# Patient Record
Sex: Female | Born: 1970 | Race: Black or African American | Hispanic: No | Marital: Married | Smoking: Never smoker
Health system: Southern US, Community
[De-identification: ages and names within clinical notes are randomized; demographics above are authoritative.]

## PROBLEM LIST (undated history)

## (undated) DIAGNOSIS — F32A Depression, unspecified: Secondary | ICD-10-CM

## (undated) DIAGNOSIS — I10 Essential (primary) hypertension: Secondary | ICD-10-CM

## (undated) DIAGNOSIS — F1010 Alcohol abuse, uncomplicated: Secondary | ICD-10-CM

## (undated) DIAGNOSIS — K219 Gastro-esophageal reflux disease without esophagitis: Secondary | ICD-10-CM

## (undated) DIAGNOSIS — F419 Anxiety disorder, unspecified: Secondary | ICD-10-CM

## (undated) DIAGNOSIS — D649 Anemia, unspecified: Secondary | ICD-10-CM

## (undated) HISTORY — DX: Depression, unspecified: F32.A

## (undated) HISTORY — DX: Anemia, unspecified: D64.9

## (undated) HISTORY — DX: Anxiety disorder, unspecified: F41.9

## (undated) HISTORY — DX: Alcohol abuse, uncomplicated: F10.10

---

## 1999-01-06 ENCOUNTER — Emergency Department (HOSPITAL_COMMUNITY): Admission: EM | Admit: 1999-01-06 | Discharge: 1999-01-06 | Payer: Self-pay | Admitting: Emergency Medicine

## 1999-03-11 ENCOUNTER — Inpatient Hospital Stay (HOSPITAL_COMMUNITY): Admission: AD | Admit: 1999-03-11 | Discharge: 1999-03-11 | Payer: Self-pay | Admitting: *Deleted

## 2001-01-05 ENCOUNTER — Emergency Department (HOSPITAL_COMMUNITY): Admission: EM | Admit: 2001-01-05 | Discharge: 2001-01-05 | Payer: Self-pay | Admitting: Emergency Medicine

## 2001-02-19 ENCOUNTER — Inpatient Hospital Stay (HOSPITAL_COMMUNITY): Admission: AD | Admit: 2001-02-19 | Discharge: 2001-02-19 | Payer: Self-pay | Admitting: *Deleted

## 2001-02-26 ENCOUNTER — Ambulatory Visit (HOSPITAL_COMMUNITY): Admission: RE | Admit: 2001-02-26 | Discharge: 2001-03-07 | Payer: Self-pay | Admitting: *Deleted

## 2001-03-07 ENCOUNTER — Encounter: Payer: Self-pay | Admitting: *Deleted

## 2001-07-04 ENCOUNTER — Inpatient Hospital Stay (HOSPITAL_COMMUNITY): Admission: AD | Admit: 2001-07-04 | Discharge: 2001-07-04 | Payer: Self-pay | Admitting: Obstetrics

## 2001-07-06 ENCOUNTER — Inpatient Hospital Stay (HOSPITAL_COMMUNITY): Admission: AD | Admit: 2001-07-06 | Discharge: 2001-07-08 | Payer: Self-pay | Admitting: *Deleted

## 2001-07-13 ENCOUNTER — Inpatient Hospital Stay (HOSPITAL_COMMUNITY): Admission: AD | Admit: 2001-07-13 | Discharge: 2001-07-13 | Payer: Self-pay | Admitting: *Deleted

## 2001-07-31 ENCOUNTER — Inpatient Hospital Stay (HOSPITAL_COMMUNITY): Admission: AD | Admit: 2001-07-31 | Discharge: 2001-07-31 | Payer: Self-pay | Admitting: *Deleted

## 2001-11-29 ENCOUNTER — Other Ambulatory Visit: Admission: RE | Admit: 2001-11-29 | Discharge: 2001-11-29 | Payer: Self-pay | Admitting: Obstetrics and Gynecology

## 2001-12-14 ENCOUNTER — Encounter: Admission: RE | Admit: 2001-12-14 | Discharge: 2001-12-14 | Payer: Self-pay | Admitting: Internal Medicine

## 2002-05-11 ENCOUNTER — Emergency Department (HOSPITAL_COMMUNITY): Admission: EM | Admit: 2002-05-11 | Discharge: 2002-05-11 | Payer: Self-pay | Admitting: Emergency Medicine

## 2002-09-13 ENCOUNTER — Emergency Department (HOSPITAL_COMMUNITY): Admission: EM | Admit: 2002-09-13 | Discharge: 2002-09-13 | Payer: Self-pay | Admitting: Emergency Medicine

## 2002-10-31 ENCOUNTER — Encounter: Payer: Self-pay | Admitting: Obstetrics & Gynecology

## 2002-10-31 ENCOUNTER — Ambulatory Visit (HOSPITAL_COMMUNITY): Admission: RE | Admit: 2002-10-31 | Discharge: 2002-10-31 | Payer: Self-pay | Admitting: Obstetrics & Gynecology

## 2002-12-30 ENCOUNTER — Encounter: Payer: Self-pay | Admitting: Obstetrics & Gynecology

## 2002-12-30 ENCOUNTER — Ambulatory Visit (HOSPITAL_COMMUNITY): Admission: RE | Admit: 2002-12-30 | Discharge: 2002-12-30 | Payer: Self-pay | Admitting: Obstetrics & Gynecology

## 2003-01-10 ENCOUNTER — Encounter: Admission: RE | Admit: 2003-01-10 | Discharge: 2003-01-10 | Payer: Self-pay | Admitting: Obstetrics

## 2003-02-13 ENCOUNTER — Encounter: Payer: Self-pay | Admitting: Obstetrics

## 2003-02-13 ENCOUNTER — Inpatient Hospital Stay (HOSPITAL_COMMUNITY): Admission: AD | Admit: 2003-02-13 | Discharge: 2003-02-14 | Payer: Self-pay | Admitting: Obstetrics

## 2003-02-28 ENCOUNTER — Ambulatory Visit (HOSPITAL_COMMUNITY): Admission: RE | Admit: 2003-02-28 | Discharge: 2003-02-28 | Payer: Self-pay | Admitting: Obstetrics

## 2003-02-28 ENCOUNTER — Encounter: Payer: Self-pay | Admitting: Obstetrics & Gynecology

## 2003-03-04 ENCOUNTER — Observation Stay (HOSPITAL_COMMUNITY): Admission: AD | Admit: 2003-03-04 | Discharge: 2003-03-05 | Payer: Self-pay | Admitting: Obstetrics

## 2003-03-09 ENCOUNTER — Encounter (INDEPENDENT_AMBULATORY_CARE_PROVIDER_SITE_OTHER): Payer: Self-pay | Admitting: Specialist

## 2003-03-09 ENCOUNTER — Inpatient Hospital Stay (HOSPITAL_COMMUNITY): Admission: AD | Admit: 2003-03-09 | Discharge: 2003-03-11 | Payer: Self-pay | Admitting: Obstetrics

## 2007-01-05 ENCOUNTER — Emergency Department (HOSPITAL_COMMUNITY): Admission: EM | Admit: 2007-01-05 | Discharge: 2007-01-05 | Payer: Self-pay | Admitting: Emergency Medicine

## 2007-01-17 ENCOUNTER — Ambulatory Visit (HOSPITAL_COMMUNITY): Admission: RE | Admit: 2007-01-17 | Discharge: 2007-01-17 | Payer: Self-pay | Admitting: Obstetrics

## 2007-10-22 ENCOUNTER — Emergency Department (HOSPITAL_COMMUNITY): Admission: EM | Admit: 2007-10-22 | Discharge: 2007-10-22 | Payer: Self-pay | Admitting: Emergency Medicine

## 2008-03-18 ENCOUNTER — Emergency Department (HOSPITAL_COMMUNITY): Admission: EM | Admit: 2008-03-18 | Discharge: 2008-03-18 | Payer: Self-pay | Admitting: Emergency Medicine

## 2008-11-28 ENCOUNTER — Emergency Department (HOSPITAL_COMMUNITY): Admission: EM | Admit: 2008-11-28 | Discharge: 2008-11-28 | Payer: Self-pay | Admitting: Emergency Medicine

## 2009-03-26 ENCOUNTER — Ambulatory Visit (HOSPITAL_COMMUNITY): Admission: RE | Admit: 2009-03-26 | Discharge: 2009-03-26 | Payer: Self-pay | Admitting: Obstetrics

## 2009-05-09 ENCOUNTER — Inpatient Hospital Stay (HOSPITAL_COMMUNITY): Admission: AD | Admit: 2009-05-09 | Discharge: 2009-05-24 | Payer: Self-pay | Admitting: Obstetrics

## 2009-05-11 ENCOUNTER — Encounter: Payer: Self-pay | Admitting: Obstetrics & Gynecology

## 2009-05-23 ENCOUNTER — Encounter: Payer: Self-pay | Admitting: Obstetrics

## 2009-09-15 ENCOUNTER — Emergency Department (HOSPITAL_COMMUNITY): Admission: EM | Admit: 2009-09-15 | Discharge: 2009-09-15 | Payer: Self-pay | Admitting: Emergency Medicine

## 2010-10-15 ENCOUNTER — Encounter: Admission: RE | Admit: 2010-10-15 | Discharge: 2010-10-15 | Payer: Self-pay | Admitting: Internal Medicine

## 2010-12-02 ENCOUNTER — Emergency Department (HOSPITAL_COMMUNITY): Admission: EM | Admit: 2010-12-02 | Discharge: 2010-03-12 | Payer: Self-pay | Admitting: Emergency Medicine

## 2011-01-16 ENCOUNTER — Encounter: Payer: Self-pay | Admitting: Internal Medicine

## 2011-01-17 ENCOUNTER — Encounter: Payer: Self-pay | Admitting: Obstetrics & Gynecology

## 2011-03-18 LAB — COMPREHENSIVE METABOLIC PANEL
AST: 26 U/L (ref 0–37)
Calcium: 9 mg/dL (ref 8.4–10.5)
Creatinine, Ser: 0.74 mg/dL (ref 0.4–1.2)
GFR calc Af Amer: >60 mL/min (ref >60)
GFR calc non Af Amer: >60 mL/min (ref >60)
Total Protein: 7 g/dL (ref 6.0–8.3)

## 2011-03-18 LAB — POCT CARDIAC MARKERS
Myoglobin, poc: 49.3 ng/mL (ref 12–200)
Troponin i, poc: <0.05 ng/mL (ref 0.00–0.09)

## 2011-03-18 LAB — DIFFERENTIAL
Basophils Relative: 0 % (ref 0–1)
Eosinophils Absolute: 0.2 10*3/uL (ref 0.0–0.7)
Lymphs Abs: 2.7 10*3/uL (ref 0.7–4.0)
Monocytes Absolute: 0.6 10*3/uL (ref 0.1–1.0)
Monocytes Relative: 6 % (ref 3–12)

## 2011-03-18 LAB — CBC
HCT: 34.3 % — ABNORMAL LOW (ref 36.0–46.0)
MCV: 77.5 fL — ABNORMAL LOW (ref 78.0–100.0)

## 2011-04-05 LAB — GLUCOSE, CAPILLARY
Glucose-Capillary: 110 mg/dL — ABNORMAL HIGH (ref 70–99)
Glucose-Capillary: 114 mg/dL — ABNORMAL HIGH (ref 70–99)
Glucose-Capillary: 135 mg/dL — ABNORMAL HIGH (ref 70–99)
Glucose-Capillary: 78 mg/dL (ref 70–99)
Glucose-Capillary: 78 mg/dL (ref 70–99)
Glucose-Capillary: 83 mg/dL (ref 70–99)
Glucose-Capillary: 86 mg/dL (ref 70–99)
Glucose-Capillary: 88 mg/dL (ref 70–99)
Glucose-Capillary: 93 mg/dL (ref 70–99)
Glucose-Capillary: 96 mg/dL (ref 70–99)

## 2011-04-05 LAB — CBC
HCT: 32.7 % — ABNORMAL LOW (ref 36.0–46.0)
Hemoglobin: 9.4 g/dL — ABNORMAL LOW (ref 12.0–15.0)
MCHC: 33.5 g/dL (ref 30.0–36.0)
MCV: 72.4 fL — ABNORMAL LOW (ref 78.0–100.0)
MCV: 71.9 fL — ABNORMAL LOW (ref 78.0–100.0)
Platelets: 314 10*3/uL (ref 150–400)
RBC: 3.87 MIL/uL (ref 3.87–5.11)
RDW: 18 % — ABNORMAL HIGH (ref 11.5–15.5)
RDW: 18.8 % — ABNORMAL HIGH (ref 11.5–15.5)

## 2011-04-05 LAB — GLUCOSE, 3 HOUR GESTATIONAL: Glucose, GTT - 3 Hour: 144 mg/dL (ref 70–144)

## 2011-04-05 LAB — STREP B DNA PROBE: Strep Group B Ag: NEGATIVE

## 2011-04-05 LAB — GLUCOSE, 2 HOUR GESTATIONAL: Glucose Tolerance, 2 hour: 194 mg/dL — ABNORMAL HIGH (ref 70–164)

## 2011-04-05 LAB — GLUCOSE, FASTING GESTATIONAL: Glucose Tolerance, Fasting: 85 mg/dL

## 2011-04-05 LAB — GLUCOSE, 1 HOUR GESTATIONAL: Glucose Tolerance, 1 hour: 202 mg/dL — ABNORMAL HIGH (ref 70–189)

## 2011-05-10 NOTE — Discharge Summary (Signed)
NAME:  BROOKLINN, LONGBOTTOM NO.:  [id number]   MEDICAL RECORD NO.:  [id number]          PATIENT TYPE:  INP   LOCATION:  9317                          FACILITY:  [facility]   PHYSICIAN:  Charles A. Clearance Coots, M.D.DATE OF BIRTH:  Dec 13, 1971   DATE OF ADMISSION:  05/09/2009  DATE OF DISCHARGE:  05/24/2009                               DISCHARGE SUMMARY   ADMITTING DIAGNOSES:  A [redacted] weeks gestation, oligohydramnios, probable  preterm premature rupture of membranes.   DISCHARGE DIAGNOSES:  A [redacted] weeks gestation, oligohydramnios, probable  preterm premature rupture of membranes, status post normal spontaneous  vaginal delivery of viable female infant at 11-28 weeks' gestation on  May 23, 2009 at 0135, Apgars of 6 at 1 and 7 at 5 minutes, weight of 990  g.  Infant was taken to the Neonatal Intensive Care Unit for  prematurity.  Mother discharged home on postpartum day #1 in good  condition.   REASON FOR ADMISSION:  A 40 year old G5, P3, estimated date of  confinement of August 16, 2009, presented with leaking fluid per vagina.  She denied uterine contractions.  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  The patient had her last  outbreak 2 days prior to her presentation.  Pregnancy was also  complicated by gestational hypertension and the patient was taking  Aldomet.   PAST MEDICAL HISTORY AND SURGERY:  Cesarean section x1.   ILLNESSES:  Genital herpes.   MEDICATIONS:  Prenatal vitamins, Valtrex, Aldomet, and Nexium.   ALLERGIES:  No known drug allergies.   SOCIAL HISTORY:  Married.  Negative tobacco, alcohol, or recreational  drug use.   FAMILY HISTORY:  Diabetes, cardiovascular disease, and hypertension.   REVIEW OF SYSTEMS:  Remarkable for genitourinary tract with leaking of  clear fluid from the vagina.   PHYSICAL EXAMINATION:  GENERAL:  Well-nourished, well-developed female  in no acute distress, afebrile.  VITAL SIGNS:  Stable.  LUNGS:  Clear to auscultation bilaterally.  HEART:  Regular rate and rhythm.  ABDOMEN:  Gravid, nontender.  Cervix, sterile speculum exam appeared to  be long and closed.  There was positive pooling of fluid in the  posterior vaginal vault that was fern negative.   ADMITTING LABS:  Ultrasound revealed cephalic presentation and fluid  management was consistent with oligohydramnios, hemoglobin 10,  hematocrit 32, white blood cell count 12,700, and platelets 314,000.   HOSPITAL COURSE:  The patient was admitted, placed on bedrest and  initially given 5 days of IV antibiotic therapy.  She had no uterine  activity.  Maternal Fetal Medicine consultation was done with [facility].  Recommendations were made for  bedrest and antibiotic therapy.  The patient did well on bedrest.  One-  hour diabetic glucose screening was positive for elevated glucose levels  and the patient underwent a 3-hour glucose tolerance test which was also  abnormal and she was started on diabetic diet.  She did well on diabetic  diet and continued to have no uterine activity until May 23, 2009, where  the patient  complained of severe cramping, and on ultrasound  examination, the vertex was in the vagina.  The patient progressed to a  precipitous normal spontaneous vaginal delivery of viable female on May 23, 2009, at 0135.  Apgars were 6 at 1 and 7 at 5 minutes, weight was  990 g.  Infant was taken to the Neonatal Intensive Care Unit for  prematurity.  The patient did well on postpartum and discharged home on  postpartum day #1 in good condition.   DISCHARGE LABS:  Hemoglobin 9.4, hematocrit 28, white blood cell count  12,500, and platelets 316,000.   DISCHARGE DISPOSITION:   MEDICATIONS:  Ibuprofen was prescribed for pain.  Continue prenatal  vitamins.  Routine written instructions were given for discharge after  vaginal delivery.  The patient is to call office for a followup   appointment in 6 weeks.      Charles A. Clearance Coots, M.D.  Electronically Signed     CAH/MEDQ  D:  05/24/2009  T:  05/24/2009  Job:  [id number]

## 2011-05-13 NOTE — Discharge Summary (Signed)
NAME:  Allison [name], Allison [name]                        ACCOUNT NO.:  [id number]   MEDICAL RECORD NO.:  [id number]                   PATIENT TYPE:  INP   LOCATION:  9139                                 [facility]ILITY:  [facility]   PHYSICIAN:  Charles A. Clearance Coots, M.D.             DATE OF BIRTH:  [birth year]   DATE OF ADMISSION:  02/12/2003  DATE OF DISCHARGE:                                 DISCHARGE SUMMARY   ADMISSION DIAGNOSES:  [redacted] weeks gestation with uncontrolled gestational  diabetes mellitus.   DISCHARGE DIAGNOSES:  [redacted] weeks gestation with uncontrolled gestational  diabetes mellitus. Discharged to home well controlled after consultation  with diabetic teaching team and instructions on dietary management and  initiation of insulin therapy. [redacted] weeks gestation, undelivered.   REASON FOR ADMISSION:  The patient is a 40 year old black female, Gravida V,  Para II, 0/2/2 at [redacted] weeks gestation, presented to the office for a prenatal  visit. Admitted to not checking her blood sugars at home secondary to lack  of supplies. The patient was given prescription for supplies to check her  blood sugars but did not get them filled. She did have good fetal activity.  Last report of blood sugars were 10-14 days ago. A decision was made to  admit the patient for diabetic management.   PAST SURGICAL HISTORY:  Two therapeutic abortions. Cesarean section in 1998  of 8 pound 11 ounce infant. V-back in 2002 of 7 pound 12 ounce infant.   PAST MEDICAL HISTORY:  None.   MEDICATIONS:  Prenatal vitamins.   ALLERGIES:  No known drug allergies.   FAMILY HISTORY:  Positive for insulin dependent diabetes mellitus and  hypertension.   SOCIAL HISTORY:  Single. Negative for tobacco, alcohol, or recreational drug  use.   PHYSICAL EXAMINATION:  GENERAL: A well developed, well nourished  black  female in no acute distress.  VITAL SIGNS: Afebrile. Vital signs stable. Accu-check blood sugar on  admission was 152 mg per  deciliter.  HEENT: Benign.  LUNGS: Clear to auscultation and percussion bilaterally.  HEART: Regular rate and rhythm.  ABDOMEN: Gravid, soft, nontender.  PELVIC: Examination omitted.   HOSPITAL COURSE:  The patient was admitted and started on ADA 1900 to 2100  calorie diet with 82-92 grams of protein, split three meals per day with  three snacks per day. Diabetic consultation was made with the Diabetic  Teaching Team. The patient's Accu-checks remained elevated on the first day  of admission from 152 to 161 mg per deciliter. Fasting blood sugar was also  elevated on February 13, 2003 of 115 mg per deciliter. Two hour postprandial  was also elevated at 9:00 a.m. on February 13, 2003 of 147 mg per deciliter.  The patient was given insulin at bedtime, 20 units subcutaneous. Fasting  blood sugar on February 14, 2003 was 88 mg per deciliter. The patient had  received full instructions from the nursing staff and the  Diabetic Teaching  Team and it was felt that she was ready for discharge. The patient was  therefore discharged home on February 14, 2003 at [redacted] weeks gestation, now  with good control of the gestational diabetes mellitus. Prescriptions were  written for all of her supplies and she is to follow-up with the Diabetic  Teaching Nurse in consultation for adjustment of her insulin if necessary,  along with the physician's at the [facility].   DISCHARGE MEDICATIONS:  1. Continue prenatal vitamins.  2. NPH Humulin insulin 20 units subcutaneous at bedtime.  3. Continue checking blood sugars as directed.   DISCHARGE INSTRUCTIONS:  Routine written OB instructions were given per  booklet for discharge, undelivered.   FOLLOW UP:  The patient is to follow-up at the [facility] in one  week and the Diabetic Management Team will be consulted for co-management of  insulin therapy.                                                Charles A. Clearance Coots, M.D.    CAH/MEDQ   D:  02/14/2003  T:  02/14/2003  Job:  [id number]   cc:   Lenor Coffin, R.N.  Diabetic Management Team  [facility]

## 2011-05-13 NOTE — H&P (Signed)
NAME:  Allison [name], Allison [name]                        ACCOUNT NO.:  [id number]   MEDICAL RECORD NO.:  [id number]                   PATIENT TYPE:  INP   LOCATION:  9165                                 [facility]ILITY:  [facility]   PHYSICIAN:  Roseanna Rainbow, M.D.         DATE OF BIRTH:  Jan 21, 1971   DATE OF ADMISSION:  03/04/2003  DATE OF DISCHARGE:                                HISTORY & PHYSICAL   CHIEF COMPLAINT:  The patient is a 40 year old gravida 5 para 2 with  estimated date of confinement of March 12, 2003 now at 39+ weeks with a  history of gestational diabetes mellitus and a previous cesarean delivery  with a successful VBAC who presents for induction of labor.   HISTORY OF PRESENT ILLNESS:  As above.  The patient received her prenatal  care with [facility].  Pregnancy problems and risk factors:  1. Late entry to care.  2. Previous cesarean delivery; again, with a successful VBAC.  3. History of recurrent HSV.  4. Borderline anemia.  5. Insulin-requiring gestational diabetes.   The patient was recently admitted for glycemic control.  An ultrasound  several days ago was consistent with an AGA fetus.   ALLERGIES:  No known drug allergies.   MEDICATIONS:  Prenatal vitamins, insulin, and Valtrex.   PAST OBSTETRICAL AND GYNECOLOGICAL HISTORY:  As above.  She is status post  two voluntary terminations of pregnancy.  Cesarean delivery in 1998 for  failure to dilate; she was  delivered of an 8 pound 11 ounce infant.  She  had a VBAC in 2002 and she was delivered of a 9 pound 12 ounce infant.   PAST MEDICAL HISTORY:  She denies.   FAMILY HISTORY:  Remarkable for diabetes mellitus and hypertension.   SOCIAL HISTORY:  She denies any tobacco, ethanol, or substance abuse.   PHYSICAL EXAMINATION:  GENERAL:  Well-developed, well-nourished African-  American female in no distress.  VITAL SIGNS:  Blood pressure 140/70; vital signs otherwise stable; afebrile.  ABDOMEN:  Gravid.  PELVIC:  SVE  as per C.N.M. in the office 2 and 60%, vertex, -3.  EXTREMITIES:  No clubbing, cyanosis, or edema.   ASSESSMENT:  1. Multipara at 39+ weeks with insulin-requiring gestational diabetes, now     with labile blood pressures.  2. History of a previous cesarean delivery with successful vaginal birth     after cesarean.  3. 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   PLAN:  1. Admission.  2. Induction of labor.  3. Pitocin.  4. AROM.                                               Roseanna Rainbow, M.D.    Judee Clara  D:  03/04/2003  T:  03/04/2003  Job:  [id number]

## 2011-09-19 LAB — BASIC METABOLIC PANEL
CO2: 22
Chloride: 105
GFR calc non Af Amer: >60
Glucose, Bld: 94
Potassium: 4
Sodium: 134 — ABNORMAL LOW

## 2011-09-19 LAB — CBC
HCT: 33.9 — ABNORMAL LOW
Hemoglobin: 11.5 — ABNORMAL LOW
MCHC: 33.9
MCV: 74.1 — ABNORMAL LOW
RDW: 16.3 — ABNORMAL HIGH

## 2011-09-19 LAB — URINALYSIS, ROUTINE W REFLEX MICROSCOPIC
Bilirubin Urine: NEGATIVE
Ketones, ur: NEGATIVE
Protein, ur: NEGATIVE
Urobilinogen, UA: 0.2

## 2011-09-19 LAB — POCT CARDIAC MARKERS
CKMB, poc: <1 — ABNORMAL LOW
Myoglobin, poc: 24.9
Troponin i, poc: <0.05

## 2011-09-19 LAB — DIFFERENTIAL
Basophils Absolute: 0.1
Eosinophils Relative: 3
Lymphocytes Relative: 37
Monocytes Absolute: 0.5

## 2011-09-19 LAB — PREGNANCY, URINE: Preg Test, Ur: NEGATIVE

## 2011-11-14 ENCOUNTER — Encounter: Payer: Self-pay | Admitting: *Deleted

## 2011-11-14 ENCOUNTER — Other Ambulatory Visit: Payer: Self-pay

## 2011-11-14 ENCOUNTER — Emergency Department (HOSPITAL_COMMUNITY): Payer: Self-pay

## 2011-11-14 ENCOUNTER — Emergency Department (HOSPITAL_COMMUNITY)
Admission: EM | Admit: 2011-11-14 | Discharge: 2011-11-14 | Disposition: A | Discharge status: Home / Self Care | Payer: Self-pay | Attending: Emergency Medicine | Admitting: Emergency Medicine

## 2011-11-14 DIAGNOSIS — R0789 Other chest pain: Secondary | ICD-10-CM

## 2011-11-14 DIAGNOSIS — I10 Essential (primary) hypertension: Secondary | ICD-10-CM | POA: Insufficient documentation

## 2011-11-14 DIAGNOSIS — K219 Gastro-esophageal reflux disease without esophagitis: Secondary | ICD-10-CM

## 2011-11-14 DIAGNOSIS — R079 Chest pain, unspecified: Secondary | ICD-10-CM | POA: Insufficient documentation

## 2011-11-14 HISTORY — DX: Essential (primary) hypertension: I10

## 2011-11-14 HISTORY — DX: Gastro-esophageal reflux disease without esophagitis: K21.9

## 2011-11-14 LAB — POCT I-STAT, CHEM 8
BUN: 11 mg/dL (ref 6–23)
Calcium, Ion: 1.11 mmol/L — ABNORMAL LOW (ref 1.12–1.32)
Glucose, Bld: 114 mg/dL — ABNORMAL HIGH (ref 70–99)
TCO2: 26 mmol/L (ref 0–100)

## 2011-11-14 LAB — POCT I-STAT TROPONIN I: Troponin i, poc: 0.01 ng/mL (ref 0.00–0.08)

## 2011-11-14 NOTE — ED Notes (Signed)
Patient transported to X-ray 

## 2011-11-14 NOTE — ED Provider Notes (Signed)
History     CSN: [id number] Arrival date & time: 11/14/2011  3:59 PM   First MD Initiated Contact with Patient 11/14/11 1704      Chief Complaint  Patient presents with  . Chest Pain    possible acid reflux    (Consider location/radiation/quality/duration/timing/severity/associated sxs/prior treatment) Patient is a 40 y.o. female presenting with chest pain. The history is provided by the patient.  Chest Pain Pertinent negatives for primary symptoms include no fatigue, no shortness of breath, no cough, no palpitations and no abdominal pain.  Pertinent negatives for associated symptoms include no diaphoresis and no numbness.    patient states she started having chest pain yesterday in the center chest.  States that she did not have any shortness of breath, numbness, weakness, vomiting, nausea, abdominal pain, shortness of breath, or headache.  She states that the pain has gotten better, since she has been here, when she started having increased belching this relieved her symptoms.  She had no radiation of the pain.  Nothing made the symptoms worse and there is no exertional symptoms.  Past Medical History  Diagnosis Date  . Hypertension   . GERD (gastroesophageal reflux disease)     History reviewed. No pertinent past surgical history.  Family History  Problem Relation Age of Onset  . Hypertension Mother   . Diabetes Mother   . Hypertension Father     History  Substance Use Topics  . Smoking status: Never Smoker   . Smokeless tobacco: Not on file  . Alcohol Use: Yes     occa    OB History    Grav Para Term Preterm Abortions TAB SAB Ect Mult Living                  Review of Systems  Constitutional: Negative for chills, diaphoresis and fatigue.  HENT: Negative for neck pain and neck stiffness.   Respiratory: Negative for cough, chest tightness and shortness of breath.   Cardiovascular: Positive for chest pain. Negative for palpitations.  Gastrointestinal:  Negative for abdominal pain.  Genitourinary: Negative for dysuria and difficulty urinating.  Skin: Negative for rash.  Neurological: Negative for syncope, light-headedness, numbness and headaches.    Allergies  Review of patient's allergies indicates no known allergies.  Home Medications   Current Outpatient Rx  Name Route Sig Dispense Refill  . ESOMEPRAZOLE MAGNESIUM 10 MG PO PACK      . ESOMEPRAZOLE MAGNESIUM 40 MG PO CPDR Oral Take 40 mg by mouth daily before breakfast.      . LISINOPRIL 5 MG PO TABS      . LISINOPRIL-HYDROCHLOROTHIAZIDE 20-25 MG PO TABS Oral Take 1 tablet by mouth daily.        BP 142/79  Pulse 87  Temp(Src) 98.6 F (37 C) (Oral)  Resp 24  Ht 5\' 3"  (1.6 m)  Wt 260 lb (117.935 kg)  BMI 46.06 kg/m2  SpO2 99%  LMP 10/29/2011  Physical Exam  Nursing note and vitals reviewed. Constitutional: She is oriented to person, place, and time. She appears well-developed and well-nourished. No distress.  HENT:  Head: Normocephalic and atraumatic.  Eyes: Pupils are equal, round, and reactive to light.  Neck: Normal range of motion. Neck supple.  Cardiovascular: Normal rate and regular rhythm.  Exam reveals no gallop and no friction rub.   No murmur heard. Pulmonary/Chest: Effort normal and breath sounds normal. No respiratory distress. She has no wheezes. She has no rales. She exhibits no tenderness.  Neurological:  She is alert and oriented to person, place, and time.  Skin: Skin is warm and dry. No rash noted.    ED Course  Procedures (including critical care time)  Labs Reviewed  POCT I-STAT, CHEM 8 - Abnormal; Notable for the following:    Potassium 3.3 (*)    Glucose, Bld 114 (*)    Calcium, Ion 1.11 (*)    Hemoglobin 11.9 (*)    HCT 35.0 (*)    All other components within normal limits  POCT I-STAT TROPONIN I  I-STAT, CHEM 8  I-STAT TROPONIN I   Dg Chest 2 View  11/14/2011  *RADIOLOGY REPORT*  Clinical Data: Chest pain.  CHEST - 2 VIEW  11/14/2011:  Comparison: Two-view chest x-ray 03/12/2010, 03/18/2008, and CTA chest 03/18/2008 [facility].  Findings: Suboptimal inspiration due to body habitus which accounts for crowded bronchovascular markings at the bases and accentuates the cardiac silhouette.  Taking this into account, cardiac silhouette mildly enlarged but stable.  Hilar and mediastinal contours otherwise unremarkable.  Lungs clear.  No pleural effusions.  Visualized bony thorax intact.  IMPRESSION: Suboptimal inspiration.  Stable mild cardiomegaly.  No acute cardiopulmonary disease.  Original Report Authenticated By: Arnell Sieving, M.D.    This is less likely cardiac in most likely related to her acid reflux.  She states that when she started belching the pain improved.     MDM  Patient has symptoms that would be atypical for cardiac chest pain.  She states that she had constant pain for the last day and, since she has been here the pain has since subsided.  She states that she did not have any radiation of the symptoms.  No shortness of breath.  No nausea, vomiting.  She states that once she started belching her pain and symptoms subsided.     Date: 11/14/2011  Rate:75   Rhythm: normal sinus rhythm  QRS Axis: normal  Intervals: normal  ST/T Wave abnormalities: normal  Conduction Disutrbances:none  Narrative Interpretation:   Old EKG Reviewed: none available and unchanged   Carlyle Dolly, PA 11/14/11 2002  Jamesetta Orleans [place], [place] 11/14/11 2005

## 2011-11-14 NOTE — ED Notes (Signed)
Pt states she is having chest pain in the central area of the chest. Pt states the pain was more on the left side last night with numbness down the left arm. Pt denies any sob

## 2011-11-15 NOTE — ED Provider Notes (Signed)
Medical screening examination/treatment/procedure(s) were performed by non-physician practitioner and as supervising physician I was immediately available for consultation/collaboration.  Rulon Abdalla, MD 11/15/11 0800 

## 2012-02-06 ENCOUNTER — Emergency Department (HOSPITAL_COMMUNITY)
Admission: EM | Admit: 2012-02-06 | Discharge: 2012-02-06 | Disposition: A | Discharge status: Home / Self Care | Payer: Medicaid Other | Attending: Emergency Medicine | Admitting: Emergency Medicine

## 2012-02-06 ENCOUNTER — Emergency Department (HOSPITAL_COMMUNITY): Payer: Medicaid Other

## 2012-02-06 ENCOUNTER — Other Ambulatory Visit: Payer: Self-pay

## 2012-02-06 ENCOUNTER — Encounter (HOSPITAL_COMMUNITY): Payer: Self-pay | Admitting: Emergency Medicine

## 2012-02-06 DIAGNOSIS — R079 Chest pain, unspecified: Secondary | ICD-10-CM | POA: Insufficient documentation

## 2012-02-06 DIAGNOSIS — R209 Unspecified disturbances of skin sensation: Secondary | ICD-10-CM | POA: Insufficient documentation

## 2012-02-06 DIAGNOSIS — I10 Essential (primary) hypertension: Secondary | ICD-10-CM | POA: Insufficient documentation

## 2012-02-06 DIAGNOSIS — R0781 Pleurodynia: Secondary | ICD-10-CM

## 2012-02-06 DIAGNOSIS — M7989 Other specified soft tissue disorders: Secondary | ICD-10-CM | POA: Insufficient documentation

## 2012-02-06 DIAGNOSIS — K219 Gastro-esophageal reflux disease without esophagitis: Secondary | ICD-10-CM | POA: Insufficient documentation

## 2012-02-06 LAB — BASIC METABOLIC PANEL
CO2: 27 mEq/L (ref 19–32)
Calcium: 9.8 mg/dL (ref 8.4–10.5)
Chloride: 99 mEq/L (ref 96–112)
Glucose, Bld: 109 mg/dL — ABNORMAL HIGH (ref 70–99)
Potassium: 3.7 mEq/L (ref 3.5–5.1)
Sodium: 137 mEq/L (ref 135–145)

## 2012-02-06 MED ORDER — ASPIRIN 81 MG PO CHEW
324.0000 mg | CHEWABLE_TABLET | Freq: Once | ORAL | Status: AC
  Administered 2012-02-06: 324 mg via ORAL
  Filled 2012-02-06: qty 4

## 2012-02-06 NOTE — ED Notes (Signed)
Pt c/o left sided chest tightness; woke up at 3am with tightness; no n/v; bilateral arm and feet having periods of numbness; denies sob; skin warm and dry

## 2012-02-06 NOTE — ED Provider Notes (Signed)
History     CSN: [id number]  Arrival date & time 02/06/12  7829   First MD Initiated Contact with Patient 02/06/12 [phone]      Chief Complaint  Patient presents with  . Chest Pain    (Consider location/radiation/quality/duration/timing/severity/associated sxs/prior treatment) HPI  Patient relates she got up at 3 AM to use the bathroom and noted her feet felt numb. She also developed a pleuritic left upper chest pain that lasted less than a second and would come and go. She related it to possibly her reflux and states she ate kate in the evening last night. She denies having a constant discomfort. She states it lasted until she got to the emergency room which is around 6 AM. She denies nausea, vomiting, shortness of breath, diaphoresis. She states she's had this pain before in the last time she came to the ER for it was in November and she was diagnosed with reflux disease. She states she's been having a lot of burping. She is not taking any specific medications for the pain today. She relates she ran out of her Nexium a month ago and just got a prescription for Prilosec which she started yesterday. She also relates she's had some swelling of her ankles recently.  PCP Dr. Concepcion Elk   Past Medical History  Diagnosis Date  . Hypertension   . GERD (gastroesophageal reflux disease)     History reviewed. No pertinent past surgical history.  Family History  Problem Relation Age of Onset  . Hypertension Mother   . Diabetes Mother   . Hypertension Father   MOP ? Cardiac stent about age 58 MGM CABG in 66's  History  Substance Use Topics  . Smoking status: Never Smoker   . Smokeless tobacco: Not on file  . Alcohol Use: Yes     occa  Student  OB History    Grav Para Term Preterm Abortions TAB SAB Ect Mult Living                  Review of Systems  All other systems reviewed and are negative.    Allergies  Review of patient's allergies indicates no known allergies.  Home  Medications   Current Outpatient Rx  Name Route Sig Dispense Refill  . LISINOPRIL-HYDROCHLOROTHIAZIDE 20-25 MG PO TABS Oral Take 1 tablet by mouth daily.    [name] OMEPRAZOLE 20 MG PO CPDR Oral Take 20 mg by mouth daily.    Nuva Ring x 8 years  BP 138/86  Pulse 85  Temp 98.9 F (37.2 C)  Resp 20  Ht 5\' 3"  (1.6 m)  Wt 220 lb (99.791 kg)  BMI 38.97 kg/m2  SpO2 99%  LMP 01/10/2012  Vital signs normal    Physical Exam  Constitutional: She is oriented to person, place, and time. She appears well-developed and well-nourished.  Non-toxic appearance. She does not appear ill. No distress.  HENT:  Head: Normocephalic and atraumatic.  Right Ear: External ear normal.  Left Ear: External ear normal.  Nose: Nose normal. No mucosal edema or rhinorrhea.  Mouth/Throat: Oropharynx is clear and moist and mucous membranes are normal. No dental abscesses or uvula swelling.  Eyes: Conjunctivae and EOM are normal. Pupils are equal, round, and reactive to light.  Neck: Normal range of motion and full passive range of motion without pain. Neck supple.  Cardiovascular: Normal rate, regular rhythm and normal heart sounds.  Exam reveals no gallop and no friction rub.   No murmur heard. Pulmonary/Chest: Effort  normal and breath sounds normal. No respiratory distress. She has no wheezes. She has no rhonchi. She has no rales. She exhibits no tenderness and no crepitus.  Abdominal: Soft. Normal appearance and bowel sounds are normal. She exhibits no distension. There is no tenderness. There is no rebound and no guarding.  Musculoskeletal: Normal range of motion. She exhibits no edema and no tenderness.       Moves all extremities well.   Neurological: She is alert and oriented to person, place, and time. She has normal strength. No cranial nerve deficit.  Skin: Skin is warm, dry and intact. No rash noted. No erythema. No pallor.  Psychiatric: She has a normal mood and affect. Her speech is normal and behavior is  normal. Her mood appears not anxious.    ED Course  Procedures (including critical care time)  09:15Pt asleep, states she hasn't had any more episodes since I interviewed her. Feels ready to go home. Patient has an appointment with Dr Concepcion Elk in 2 days. He discusses sometimes it takes a couple days for the proton pump inhibitors to have their full effect.  Results for orders placed during the hospital encounter of 02/06/12  D-DIMER, QUANTITATIVE      Component Value Range   D-Dimer, Quant 0.30  0.00 - 0.48 (ug/mL-FEU)  TROPONIN I      Component Value Range   Troponin I <0.30  <0.30 (ng/mL)  BASIC METABOLIC PANEL      Component Value Range   Sodium 137  135 - 145 (mEq/L)   Potassium 3.7  3.5 - 5.1 (mEq/L)   Chloride 99  96 - 112 (mEq/L)   CO2 27  19 - 32 (mEq/L)   Glucose, Bld 109 (*) 70 - 99 (mg/dL)   BUN 12  6 - 23 (mg/dL)   Creatinine, Ser 4.09  0.50 - 1.10 (mg/dL)   Calcium 9.8  8.4 - 81.1 (mg/dL)   GFR calc non Af Amer 70 (*) >90 (mL/min)   GFR calc Af Amer 81 (*) >90 (mL/min)   Laboratory interpretation all normal  .   Date: 02/06/2012  Rate: 77  Rhythm: normal sinus rhythm  QRS Axis: normal  Intervals: normal  ST/T Wave abnormalities: nonspecific T wave changes  Conduction Disutrbances:none  Narrative Interpretation:   Old EKG Reviewed: unchanged from 11/14/2011   Diagnoses that have been ruled out:  None  Diagnoses that are still under consideration:  None  Final diagnoses:  Pleuritic chest pain   Plan discharge  Devoria Albe, MD, Armando Gang      MDM          Ward Givens, MD 02/06/12 [phone]

## 2012-02-06 NOTE — ED Notes (Signed)
Patient arrives with c/o sudden onset left sided CP that began at approx. 0300 today that woke her from her sleep. Also reports intermittent numbness in various locations on body. States "it jumps around" from her legs to her arms.  Denies N/V/SOB at this time.

## 2012-09-25 ENCOUNTER — Emergency Department (HOSPITAL_COMMUNITY): Payer: Medicaid Other

## 2012-09-25 ENCOUNTER — Encounter (HOSPITAL_COMMUNITY): Payer: Self-pay | Admitting: Emergency Medicine

## 2012-09-25 ENCOUNTER — Emergency Department (HOSPITAL_COMMUNITY)
Admission: EM | Admit: 2012-09-25 | Discharge: 2012-09-25 | Disposition: A | Discharge status: Home / Self Care | Payer: Medicaid Other | Attending: Emergency Medicine | Admitting: Emergency Medicine

## 2012-09-25 DIAGNOSIS — R079 Chest pain, unspecified: Secondary | ICD-10-CM

## 2012-09-25 DIAGNOSIS — K219 Gastro-esophageal reflux disease without esophagitis: Secondary | ICD-10-CM | POA: Insufficient documentation

## 2012-09-25 DIAGNOSIS — I10 Essential (primary) hypertension: Secondary | ICD-10-CM | POA: Insufficient documentation

## 2012-09-25 LAB — TROPONIN I: Troponin I: <0.3 ng/mL (ref <0.30)

## 2012-09-25 NOTE — ED Provider Notes (Signed)
History     CSN: [id number]  Arrival date & time 09/25/12  1658   First MD Initiated Contact with Patient 09/25/12 1937      Chief Complaint  Patient presents with  . Chest Pain    (Consider location/radiation/quality/duration/timing/severity/associated sxs/prior treatment) Patient is a 41 y.o. female presenting with chest pain. The history is provided by the patient.  Chest Pain Pertinent negatives for primary symptoms include no fever, no shortness of breath, no cough, no palpitations, no abdominal pain, no nausea and no vomiting.   pt  C/o intermittent dull cp. States gets up to go to bathroom at night, and when lies back flat will get dull mid cp lasting a few minutes, at times burning, then will burp/belch. Denies exertional cp or discomfort. No associated nv, diaphoresis or sob. No pleuritic pain. No relation to eating. Hx gerd. Takes prilosec. Denies any change in med. No fam hx cad. No cough or uri c/o. No chest wall injury or strain. No fever or chills.      Past Medical History  Diagnosis Date  . Hypertension   . GERD (gastroesophageal reflux disease)     History reviewed. No pertinent past surgical history.  Family History  Problem Relation Age of Onset  . Hypertension Mother   . Diabetes Mother   . Hypertension Father     History  Substance Use Topics  . Smoking status: Never Smoker   . Smokeless tobacco: Not on file  . Alcohol Use: 1.8 oz/week    3 Cans of beer per week     daily     OB History    Grav Para Term Preterm Abortions TAB SAB Ect Mult Living                  Review of Systems  Constitutional: Negative for fever and chills.  HENT: Negative for neck pain.   Eyes: Negative for redness.  Respiratory: Negative for cough and shortness of breath.   Cardiovascular: Positive for chest pain. Negative for palpitations and leg swelling.  Gastrointestinal: Negative for nausea, vomiting and abdominal pain.  Genitourinary: Negative for flank  pain.  Musculoskeletal: Negative for back pain.  Skin: Negative for rash.  Neurological: Negative for headaches.  Hematological: Does not bruise/bleed easily.  Psychiatric/Behavioral: Negative for confusion.    Allergies  Review of patient's allergies indicates no known allergies.  Home Medications   Current Outpatient Rx  Name Route Sig Dispense Refill  . LISINOPRIL-HYDROCHLOROTHIAZIDE 20-25 MG PO TABS Oral Take 1 tablet by mouth daily.    [name] METOPROLOL TARTRATE 50 MG PO TABS Oral Take 50 mg by mouth 2 (two) times daily.    [name] OMEPRAZOLE 40 MG PO CPDR Oral Take 40 mg by mouth daily.      BP 153/87  Pulse 75  Temp 98.6 F (37 C) (Oral)  Resp 16  SpO2 96%  LMP 09/18/2012  Physical Exam  Nursing note and vitals reviewed. Constitutional: She appears well-developed and well-nourished. No distress.  HENT:  Nose: Nose normal.  Eyes: Conjunctivae normal are normal. No scleral icterus.  Neck: Neck supple. No tracheal deviation present.  Cardiovascular: Normal rate, regular rhythm and intact distal pulses.  Exam reveals no gallop and no friction rub.   No murmur heard. Pulmonary/Chest: Effort normal and breath sounds normal. No respiratory distress. She exhibits no tenderness.  Abdominal: Soft. Normal appearance and bowel sounds are normal. She exhibits no distension.  Musculoskeletal: She exhibits no edema and no tenderness.  Neurological: She is alert.  Skin: Skin is warm and dry. No rash noted.  Psychiatric: She has a normal mood and affect.    ED Course  Procedures (including critical care time)   Labs Reviewed  TROPONIN I   Results for orders placed during the hospital encounter of 09/25/12  TROPONIN I      Component Value Range   Troponin I <0.30  <0.30 ng/mL   Dg Chest 2 View  09/25/2012  *RADIOLOGY REPORT*  Clinical Data: Chest pain.  Hypertension.  CHEST - 2 VIEW  Comparison: 02/06/2012  Findings: Mild cardiomegaly observed.  No edema.  No pleural effusion.   The lungs appear clear.  IMPRESSION:  1.  Mild cardiomegaly.   Otherwise, no significant abnormality identified.   Original Report Authenticated By: Dellia Cloud, M.D.       MDM  Cxr.    Date: 09/25/2012  Rate: 74  Rhythm: normal sinus rhythm  QRS Axis: normal  Intervals: normal  ST/T Wave abnormalities: normal  Conduction Disutrbances:none  Narrative Interpretation:   Old EKG Reviewed: unchanged   Pt symptoms do not appear c/w acs. cxr neg. Trop neg.   Hx gerd. rec adding pepcid/maalox prn. pcp follow up.         Suzi Roots, MD 09/25/12 2038

## 2012-09-25 NOTE — ED Notes (Signed)
Bed:WA10<BR> Expected date:<BR> Expected time:<BR> Means of arrival:<BR> Comments:<BR> Hold for triage

## 2012-09-25 NOTE — ED Notes (Signed)
Pt states "i have been here plenty of times for pain, but this is different. Left sided chest pain, having lots of burping/gas too"  States pain has been going on for three days-- on left side, alert, oriented x 3, w/d

## 2012-11-24 IMAGING — CR DG CHEST 2V
2 series · 2 of 2 positions shown · non-contrast
Comparison: 02/06/2012

CLINICAL DATA: Chest pain.  Hypertension.

CHEST - 2 VIEW

[w chest pa]
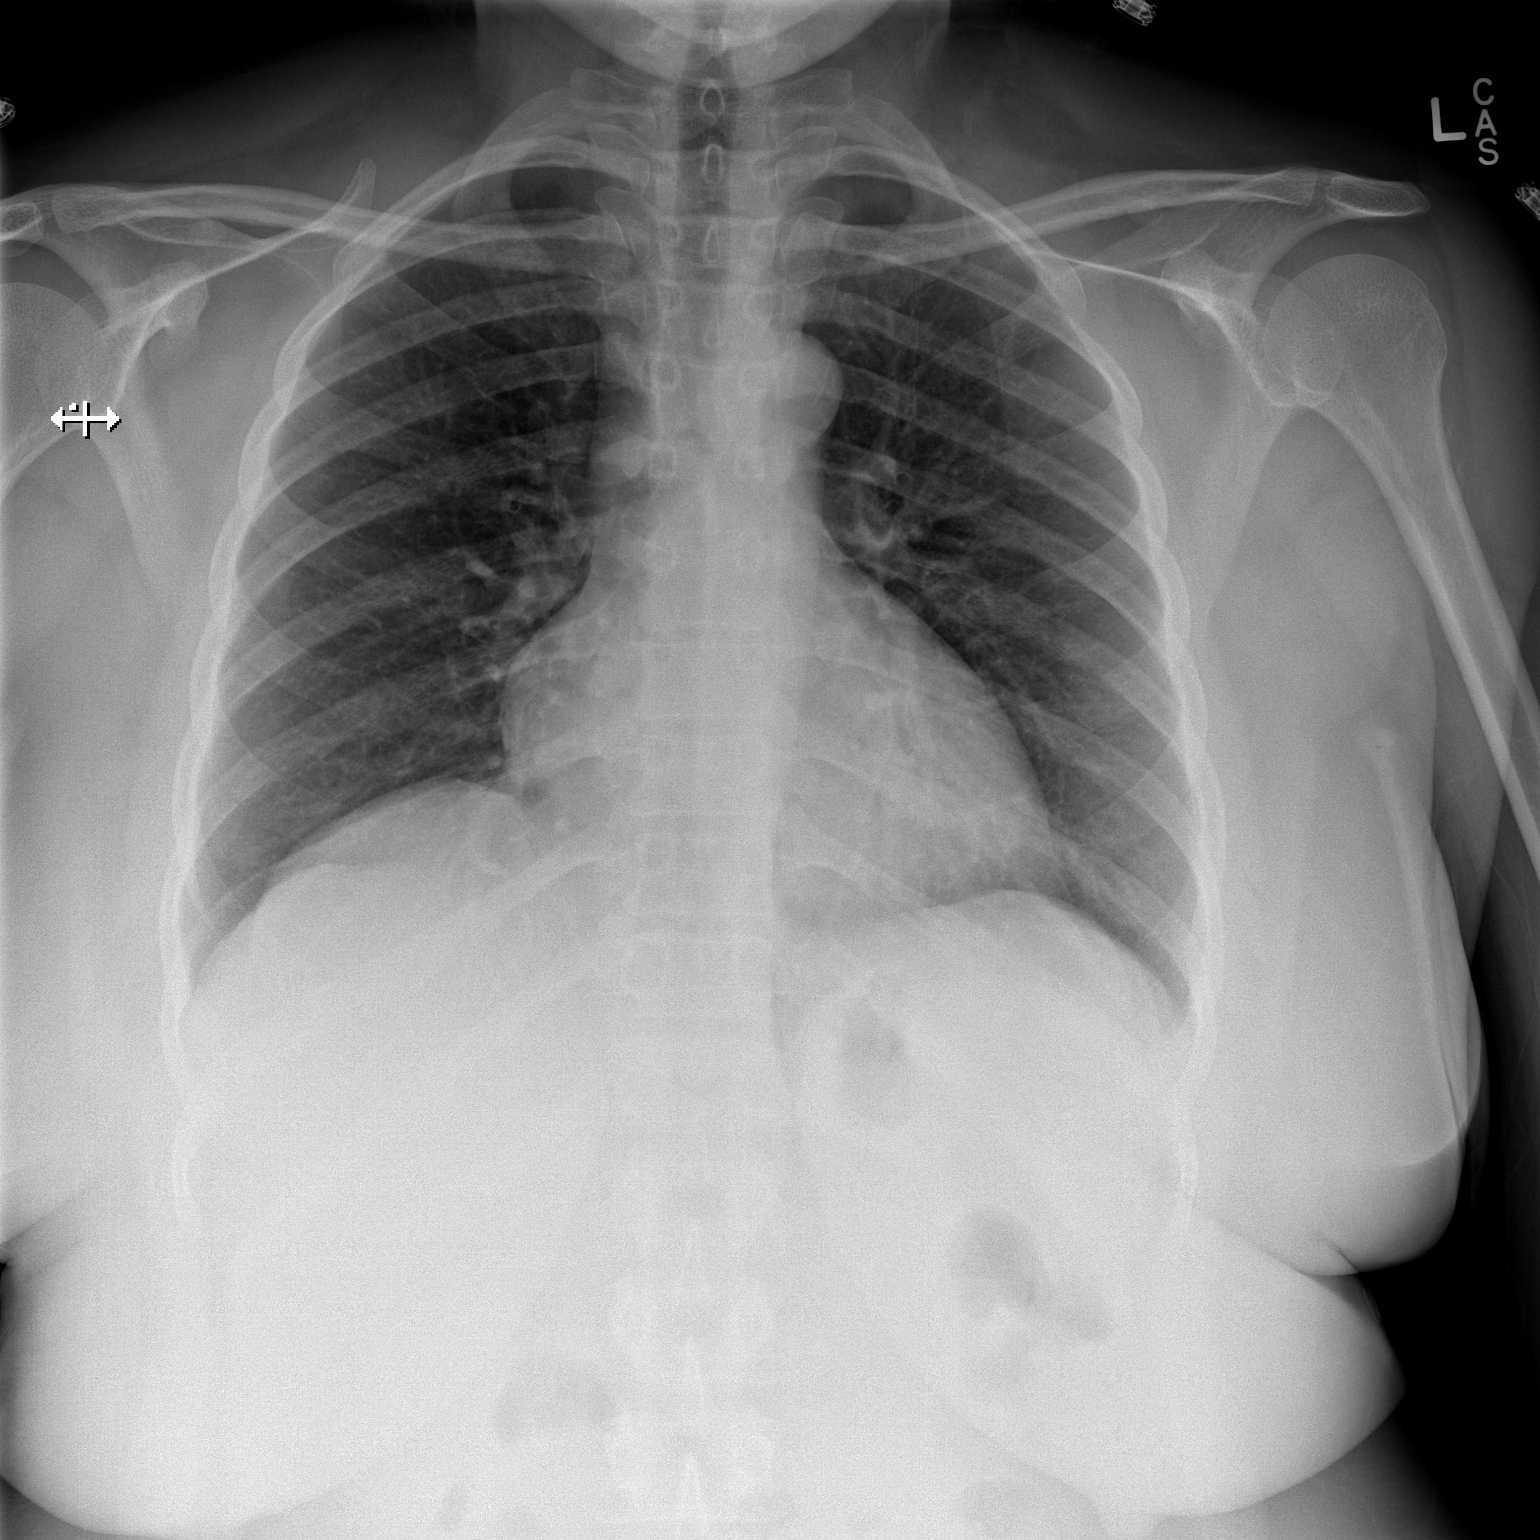

[w chest lat]
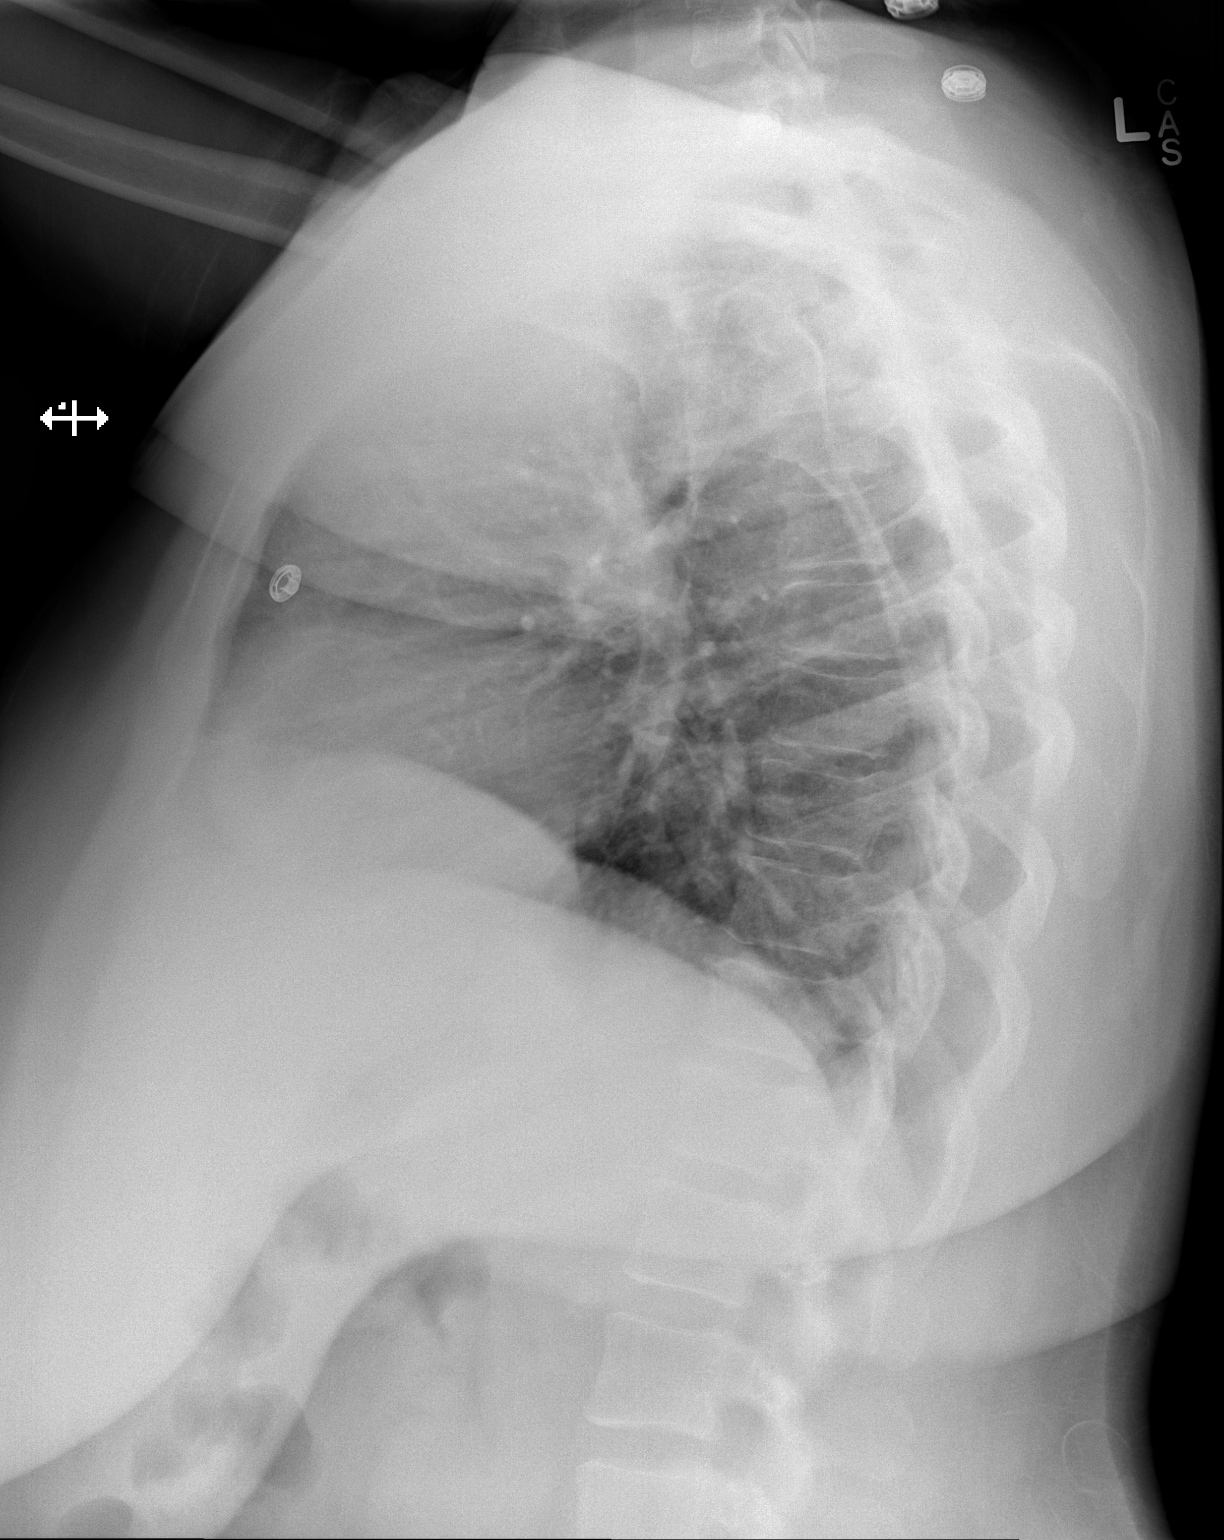

[2 of 2 positions shown; findings below may reference images not displayed]

FINDINGS: Mild cardiomegaly observed.  No edema.  No pleural
effusion.  The lungs appear clear.
IMPRESSION: 1.  Mild cardiomegaly.   Otherwise, no significant abnormality
identified..

## 2013-01-17 ENCOUNTER — Other Ambulatory Visit: Payer: Self-pay | Admitting: Internal Medicine

## 2013-01-18 ENCOUNTER — Other Ambulatory Visit: Payer: Self-pay | Admitting: Internal Medicine

## 2013-01-21 ENCOUNTER — Other Ambulatory Visit: Payer: Self-pay | Admitting: Internal Medicine

## 2013-01-21 DIAGNOSIS — N6310 Unspecified lump in the right breast, unspecified quadrant: Secondary | ICD-10-CM

## 2013-01-31 ENCOUNTER — Ambulatory Visit
Admission: RE | Admit: 2013-01-31 | Discharge: 2013-01-31 | Disposition: A | Discharge status: Home / Self Care | Payer: Medicaid Other | Source: Ambulatory Visit | Attending: Internal Medicine | Admitting: Internal Medicine

## 2013-01-31 ENCOUNTER — Other Ambulatory Visit: Payer: Self-pay | Admitting: Internal Medicine

## 2013-01-31 DIAGNOSIS — N6310 Unspecified lump in the right breast, unspecified quadrant: Secondary | ICD-10-CM

## 2014-02-05 ENCOUNTER — Emergency Department (HOSPITAL_COMMUNITY): Payer: Medicaid Other

## 2014-02-05 ENCOUNTER — Encounter (HOSPITAL_COMMUNITY): Payer: Self-pay | Admitting: Emergency Medicine

## 2014-02-05 ENCOUNTER — Emergency Department (HOSPITAL_COMMUNITY)
Admission: EM | Admit: 2014-02-05 | Discharge: 2014-02-05 | Discharge status: Against Medical Advice | Payer: Medicaid Other | Attending: Emergency Medicine | Admitting: Emergency Medicine

## 2014-02-05 DIAGNOSIS — R079 Chest pain, unspecified: Secondary | ICD-10-CM | POA: Insufficient documentation

## 2014-02-05 DIAGNOSIS — I10 Essential (primary) hypertension: Secondary | ICD-10-CM | POA: Insufficient documentation

## 2014-02-05 LAB — BASIC METABOLIC PANEL
BUN: 8 mg/dL (ref 6–23)
CALCIUM: 9.3 mg/dL (ref 8.4–10.5)
CHLORIDE: 98 meq/L (ref 96–112)
CO2: 26 mEq/L (ref 19–32)
Creatinine, Ser: 0.66 mg/dL (ref 0.50–1.10)
GFR calc Af Amer: >90 mL/min (ref >90)
Glucose, Bld: 117 mg/dL — ABNORMAL HIGH (ref 70–99)
Potassium: 3.3 mEq/L — ABNORMAL LOW (ref 3.7–5.3)
Sodium: 136 mEq/L — ABNORMAL LOW (ref 137–147)

## 2014-02-05 LAB — CBC
HEMATOCRIT: 37.1 % (ref 36.0–46.0)
Hemoglobin: 12 g/dL (ref 12.0–15.0)
MCH: 24.5 pg — AB (ref 26.0–34.0)
MCHC: 32.3 g/dL (ref 30.0–36.0)
MCV: 75.9 fL — AB (ref 78.0–100.0)
PLATELETS: 259 10*3/uL (ref 150–400)
RBC: 4.89 MIL/uL (ref 3.87–5.11)
RDW: 16.4 % — ABNORMAL HIGH (ref 11.5–15.5)
WBC: 11 10*3/uL — AB (ref 4.0–10.5)

## 2014-02-05 LAB — POCT I-STAT TROPONIN I: Troponin i, poc: 0 ng/mL (ref 0.00–0.08)

## 2014-02-05 NOTE — ED Notes (Signed)
Pt states that she was awoke from her sleep around 2:30am with left sided chest pain; pt states that the pain is intermittent and sharp in nature; pt denies radiation of the pain or accompanying symptoms

## 2014-02-05 NOTE — ED Notes (Signed)
Pt states that they have been waiting for 3 hours and she "has kids at home."  Pt states she wants to leave.  Pt made aware that they would be leaving against medical advice and that they assume all the responsibilities should anything happen.  Pt express understanding and walked out the department with spouse.  Dr. Dierdre Highmanpitz made aware.

## 2016-05-22 ENCOUNTER — Encounter (HOSPITAL_COMMUNITY): Payer: Self-pay | Admitting: Emergency Medicine

## 2016-05-22 ENCOUNTER — Inpatient Hospital Stay (HOSPITAL_COMMUNITY)
Admission: EM | Admit: 2016-05-22 | Discharge: 2016-05-27 | DRG: 872 | Disposition: A | Discharge status: Home / Self Care | Payer: Self-pay | Attending: Internal Medicine | Admitting: Internal Medicine

## 2016-05-22 DIAGNOSIS — N12 Tubulo-interstitial nephritis, not specified as acute or chronic: Secondary | ICD-10-CM | POA: Diagnosis present

## 2016-05-22 DIAGNOSIS — D509 Iron deficiency anemia, unspecified: Secondary | ICD-10-CM | POA: Diagnosis present

## 2016-05-22 DIAGNOSIS — R509 Fever, unspecified: Secondary | ICD-10-CM

## 2016-05-22 DIAGNOSIS — R319 Hematuria, unspecified: Secondary | ICD-10-CM

## 2016-05-22 DIAGNOSIS — N83209 Unspecified ovarian cyst, unspecified side: Secondary | ICD-10-CM

## 2016-05-22 DIAGNOSIS — N2889 Other specified disorders of kidney and ureter: Secondary | ICD-10-CM

## 2016-05-22 DIAGNOSIS — E119 Type 2 diabetes mellitus without complications: Secondary | ICD-10-CM | POA: Diagnosis present

## 2016-05-22 DIAGNOSIS — K219 Gastro-esophageal reflux disease without esophagitis: Secondary | ICD-10-CM | POA: Diagnosis present

## 2016-05-22 DIAGNOSIS — E872 Acidosis: Secondary | ICD-10-CM | POA: Diagnosis present

## 2016-05-22 DIAGNOSIS — E876 Hypokalemia: Secondary | ICD-10-CM | POA: Diagnosis present

## 2016-05-22 DIAGNOSIS — R739 Hyperglycemia, unspecified: Secondary | ICD-10-CM

## 2016-05-22 DIAGNOSIS — A419 Sepsis, unspecified organism: Secondary | ICD-10-CM

## 2016-05-22 DIAGNOSIS — N10 Acute tubulo-interstitial nephritis: Secondary | ICD-10-CM | POA: Diagnosis present

## 2016-05-22 DIAGNOSIS — N83202 Unspecified ovarian cyst, left side: Secondary | ICD-10-CM | POA: Diagnosis present

## 2016-05-22 DIAGNOSIS — N179 Acute kidney failure, unspecified: Secondary | ICD-10-CM | POA: Diagnosis present

## 2016-05-22 DIAGNOSIS — I10 Essential (primary) hypertension: Secondary | ICD-10-CM | POA: Diagnosis present

## 2016-05-22 DIAGNOSIS — A4151 Sepsis due to Escherichia coli [E. coli]: Principal | ICD-10-CM | POA: Diagnosis present

## 2016-05-22 DIAGNOSIS — I158 Other secondary hypertension: Secondary | ICD-10-CM

## 2016-05-22 LAB — CBC
HCT: 27.5 % — ABNORMAL LOW (ref 36.0–46.0)
Hemoglobin: 8.2 g/dL — ABNORMAL LOW (ref 12.0–15.0)
MCH: 20.3 pg — ABNORMAL LOW (ref 26.0–34.0)
MCHC: 29.8 g/dL — ABNORMAL LOW (ref 30.0–36.0)
MCV: 68.2 fL — ABNORMAL LOW (ref 78.0–100.0)
PLATELETS: 324 10*3/uL (ref 150–400)
RBC: 4.03 MIL/uL (ref 3.87–5.11)
RDW: 21.1 % — AB (ref 11.5–15.5)
WBC: 25.6 10*3/uL — AB (ref 4.0–10.5)

## 2016-05-22 LAB — BASIC METABOLIC PANEL
Anion gap: 12 (ref 5–15)
BUN: 16 mg/dL (ref 6–20)
CO2: 24 mmol/L (ref 22–32)
CREATININE: 1.65 mg/dL — AB (ref 0.44–1.00)
Calcium: 8 mg/dL — ABNORMAL LOW (ref 8.9–10.3)
Chloride: 95 mmol/L — ABNORMAL LOW (ref 101–111)
GFR, EST AFRICAN AMERICAN: 43 mL/min — AB (ref >60)
GFR, EST NON AFRICAN AMERICAN: 37 mL/min — AB (ref >60)
Glucose, Bld: 305 mg/dL — ABNORMAL HIGH (ref 65–99)
Potassium: 3.1 mmol/L — ABNORMAL LOW (ref 3.5–5.1)
SODIUM: 131 mmol/L — AB (ref 135–145)

## 2016-05-22 LAB — URINALYSIS, ROUTINE W REFLEX MICROSCOPIC
Bilirubin Urine: NEGATIVE
Glucose, UA: 100 mg/dL — AB
KETONES UR: NEGATIVE mg/dL
NITRITE: NEGATIVE
PH: 5.5 (ref 5.0–8.0)
PROTEIN: 100 mg/dL — AB
Specific Gravity, Urine: 1.018 (ref 1.005–1.030)

## 2016-05-22 LAB — GLUCOSE, CAPILLARY: GLUCOSE-CAPILLARY: 179 mg/dL — AB (ref 65–99)

## 2016-05-22 LAB — URINE MICROSCOPIC-ADD ON

## 2016-05-22 LAB — I-STAT CG4 LACTIC ACID, ED
LACTIC ACID, VENOUS: 5.72 mmol/L — AB (ref 0.5–2.0)
Lactic Acid, Venous: 2.65 mmol/L (ref 0.5–2.0)

## 2016-05-22 LAB — PROCALCITONIN: Procalcitonin: 10.63 ng/mL

## 2016-05-22 LAB — LACTIC ACID, PLASMA: Lactic Acid, Venous: 2.1 mmol/L (ref 0.5–2.0)

## 2016-05-22 LAB — I-STAT BETA HCG BLOOD, ED (MC, WL, AP ONLY): I-stat hCG, quantitative: <5 m[IU]/mL (ref <5)

## 2016-05-22 MED ORDER — SODIUM CHLORIDE 0.9 % IV SOLN
INTRAVENOUS | Status: DC
  Administered 2016-05-22 – 2016-05-24 (×4): via INTRAVENOUS
  Administered 2016-05-25 (×2): 1000 mL via INTRAVENOUS

## 2016-05-22 MED ORDER — SODIUM CHLORIDE 0.9 % IV BOLUS (SEPSIS)
1000.0000 mL | Freq: Once | INTRAVENOUS | Status: AC
  Administered 2016-05-22: 1000 mL via INTRAVENOUS

## 2016-05-22 MED ORDER — ONDANSETRON HCL 4 MG/2ML IJ SOLN
4.0000 mg | Freq: Once | INTRAMUSCULAR | Status: AC
  Administered 2016-05-22: 4 mg via INTRAVENOUS
  Filled 2016-05-22: qty 2

## 2016-05-22 MED ORDER — ONDANSETRON HCL 4 MG/2ML IJ SOLN
4.0000 mg | Freq: Three times a day (TID) | INTRAMUSCULAR | Status: DC | PRN
Start: 2016-05-22 — End: 2016-05-22

## 2016-05-22 MED ORDER — ACETAMINOPHEN 325 MG PO TABS
650.0000 mg | ORAL_TABLET | Freq: Once | ORAL | Status: AC | PRN
  Administered 2016-05-22: 650 mg via ORAL
  Filled 2016-05-22: qty 2

## 2016-05-22 MED ORDER — ENOXAPARIN SODIUM 40 MG/0.4ML ~~LOC~~ SOLN
40.0000 mg | Freq: Every day | SUBCUTANEOUS | Status: DC
  Administered 2016-05-22 – 2016-05-26 (×5): 40 mg via SUBCUTANEOUS
  Filled 2016-05-22 (×5): qty 0.4

## 2016-05-22 MED ORDER — ONDANSETRON HCL 4 MG/2ML IJ SOLN: 4.0000 mg | Freq: Four times a day (QID) | INTRAMUSCULAR | Status: DC | PRN

## 2016-05-22 MED ORDER — MORPHINE SULFATE (PF) 4 MG/ML IV SOLN
4.0000 mg | Freq: Once | INTRAVENOUS | Status: AC
  Administered 2016-05-22: 4 mg via INTRAVENOUS
  Filled 2016-05-22: qty 1

## 2016-05-22 MED ORDER — POTASSIUM CHLORIDE CRYS ER 20 MEQ PO TBCR
40.0000 meq | EXTENDED_RELEASE_TABLET | Freq: Once | ORAL | Status: AC
  Administered 2016-05-22: 40 meq via ORAL
  Filled 2016-05-22: qty 2

## 2016-05-22 MED ORDER — HYDROMORPHONE HCL 1 MG/ML IJ SOLN
0.5000 mg | INTRAMUSCULAR | Status: DC | PRN
  Administered 2016-05-22 – 2016-05-26 (×17): 1 mg via INTRAVENOUS
  Filled 2016-05-22 (×17): qty 1

## 2016-05-22 MED ORDER — CEFTRIAXONE SODIUM 1 G IJ SOLR
1.0000 g | INTRAMUSCULAR | Status: DC
  Filled 2016-05-22: qty 10

## 2016-05-22 MED ORDER — ACETAMINOPHEN 650 MG RE SUPP: 650.0000 mg | Freq: Four times a day (QID) | RECTAL | Status: DC | PRN

## 2016-05-22 MED ORDER — INSULIN ASPART 100 UNIT/ML ~~LOC~~ SOLN
0.0000 [IU] | Freq: Three times a day (TID) | SUBCUTANEOUS | Status: DC
  Administered 2016-05-23 (×2): 2 [IU] via SUBCUTANEOUS
  Administered 2016-05-24 – 2016-05-25 (×2): 1 [IU] via SUBCUTANEOUS
  Administered 2016-05-26: 2 [IU] via SUBCUTANEOUS
  Administered 2016-05-26: 1 [IU] via SUBCUTANEOUS

## 2016-05-22 MED ORDER — INSULIN ASPART 100 UNIT/ML ~~LOC~~ SOLN: 0.0000 [IU] | Freq: Every day | SUBCUTANEOUS | Status: DC

## 2016-05-22 MED ORDER — ONDANSETRON HCL 4 MG PO TABS: 4.0000 mg | ORAL_TABLET | Freq: Four times a day (QID) | ORAL | Status: DC | PRN

## 2016-05-22 MED ORDER — OXYCODONE HCL 5 MG PO TABS
5.0000 mg | ORAL_TABLET | ORAL | Status: DC | PRN
  Administered 2016-05-23 – 2016-05-27 (×7): 5 mg via ORAL
  Filled 2016-05-22 (×7): qty 1

## 2016-05-22 MED ORDER — ONDANSETRON HCL 4 MG/2ML IJ SOLN: 4.0000 mg | Freq: Once | INTRAMUSCULAR | Status: DC

## 2016-05-22 MED ORDER — ACETAMINOPHEN 325 MG PO TABS
650.0000 mg | ORAL_TABLET | Freq: Four times a day (QID) | ORAL | Status: DC | PRN
  Administered 2016-05-23 – 2016-05-25 (×5): 650 mg via ORAL
  Filled 2016-05-22 (×5): qty 2

## 2016-05-22 MED ORDER — DEXTROSE 5 % IV SOLN
2.0000 g | Freq: Once | INTRAVENOUS | Status: AC
  Administered 2016-05-22: 2 g via INTRAVENOUS
  Filled 2016-05-22: qty 2

## 2016-05-22 NOTE — Progress Notes (Signed)
Pharmacy Antibiotic Follow-up Note  Basil Dessracy R Cassels is a 45 y.o. year-old female admitted on 05/22/2016.  The patient is currently on day 1 of Rocephin for pyelonephritis .  Assessment/Plan: Rocephin 2gm x1, followed by 1gm q24  Temp (24hrs), Avg:103.1 F (39.5 C), Min:103.1 F (39.5 C), Max:103.1 F (39.5 C)  No results for input(s): WBC in the last 168 hours.  Invalid input(s):  CREATININE No results for input(s): CREATININE in the last 168 hours. CrCl cannot be calculated (Unknown ideal weight.).    No Known Allergies  Antimicrobials this admission: 5/28 Rocephin >>   Microbiology results:       BCx: requested       UCx: requested    Thank you for allowing pharmacy to be a part of this patient's care. Pharmacy will sign off, no renal adjustment necessary for Rocephin  Otho BellowsGreen, Torion Hulgan L PharmD 05/22/2016 5:46 PM

## 2016-05-22 NOTE — ED Notes (Signed)
Critical istat result given to PA Josh and [employer] Abbie

## 2016-05-22 NOTE — ED Notes (Signed)
Pt states that x 3 days she has had dysuria, fever and N/V. Alert and oriented.

## 2016-05-22 NOTE — H&P (Signed)
[facility] Admission History and Physical       Allison [name] WUJ:[id number] DOB: [birth year] DOA: 05/22/2016  Referring physician:  EDP  PCP: No primary care provider on file.  Specialists:   Chief Complaint:    HPI: Allison [name] is a 45 y.o. female with a history of HTN and GERD who presents to the ED with complaints of dysuria and lower ABD and Back Pain x 2 weeks and fevers and chills and nausea and Vomiting for the past 24 hours.    She was found to have a fever to 103.1 in the ED along with a positive UA and elevated BUN/Cr elevated Glucose level of 308 which is new for her.      Review of Systems:    Constitutional: No Weight Loss, No Weight Gain, Night Sweats, Fevers, Chills, Dizziness, Light Headedness, Fatigue, or Generalized Weakness HEENT: No Headaches, Difficulty Swallowing,Tooth/Dental Problems,Sore Throat,  No Sneezing, Rhinitis, Ear Ache, Nasal Congestion, or Post Nasal Drip,  Cardio-vascular:  No Chest pain, Orthopnea, PND, Edema in Lower Extremities, Anasarca, Dizziness, Palpitations  Resp: No Dyspnea, No DOE, No Productive Cough, No Non-Productive Cough, No Hemoptysis, No Wheezing.    GI: No Heartburn, Indigestion, Abdominal Pain, Nausea, Vomiting, Diarrhea, Constipation, Hematemesis, Hematochezia, Melena, Change in Bowel Habits,  Loss of Appetite  GU: No Dysuria, No Change in Color of Urine, No Urgency or Urinary Frequency, No Flank pain.  Musculoskeletal: No Joint Pain or Swelling, No Decreased Range of Motion, No Back Pain.  Neurologic: No Syncope, No Seizures, Muscle Weakness, Paresthesia, Vision Disturbance or Loss, No Diplopia, No Vertigo, No Difficulty Walking,  Skin: No Rash or Lesions. Psych: No Change in Mood or Affect, No Depression or Anxiety, No Memory loss, No Confusion, or Hallucinations   Past Medical History  Diagnosis Date  . Hypertension   . GERD (gastroesophageal reflux disease)      History reviewed. No pertinent past  surgical history.    Prior to Admission medications   Medication Sig Start Date End Date Taking? Authorizing Provider  cetirizine (ZYRTEC) 10 MG tablet Take 10 mg by mouth daily as needed for allergies.   Yes Historical Provider, MD     No Known Allergies  Social History:  reports that she has never smoked. She does not have any smokeless tobacco history on file. She reports that she drinks about 7.0 oz of alcohol per week. She reports that she does not use illicit drugs.    Family History  Problem Relation Age of Onset  . Hypertension Mother   . Diabetes Mother   . Hypertension Father        Physical Exam:  GEN:  Pleasant Well Nourished and Well Developed 45 y.o. African American female examined and in no acute distress; cooperative with exam Filed Vitals:   05/22/16 1908 05/22/16 1930 05/22/16 1945 05/22/16 2000  BP:  116/99  112/63  Pulse:  106 99 105  Temp: 100.4 F (38 C)     TempSrc: Oral     Resp:  15 24   Height:      Weight:      SpO2:  99% 97% 98%   Blood pressure 112/63, pulse 105, temperature 100.4 F (38 C), temperature source Oral, resp. rate 24, height  (1.6 m), weight 83.462 kg (184 lb), last menstrual period 04/29/2016, SpO2 98 %. PSYCH: She is alert and oriented x4; does not appear anxious does not appear depressed; affect is normal HEENT: Normocephalic and Atraumatic, Mucous membranes  pink; PERRLA; EOM intact; Fundi:  Benign;  No scleral icterus, Nares: Patent, Oropharynx: Clear, Fair Dentition,    Neck:  FROM, No Cervical Lymphadenopathy nor Thyromegaly or Carotid Bruit; No JVD; Breasts:: Not examined CHEST WALL: No tenderness CHEST: Normal respiration, clear to auscultation bilaterally HEART: Regular rate and rhythm; no murmurs rubs or gallops BACK: No kyphosis or scoliosis; No CVA tenderness ABDOMEN: Positive Bowel Sounds, Obese, Soft Non-Tender, No Rebound or Guarding; No Masses, No Organomegaly. Rectal Exam: Not done EXTREMITIES: No  Cyanosis, Clubbing, or Edema; No Ulcerations. Genitalia: not examined PULSES: 2+ and symmetric SKIN: Normal hydration no rash or ulceration CNS:  Alert and Oriented x 4, No Focal Deficits Vascular: pulses palpable throughout    Labs on Admission:  Basic Metabolic Panel:  Recent Labs Lab 05/22/16 1751  NA 131*  K 3.1*  CL 95*  CO2 24  GLUCOSE 305*  BUN 16  CREATININE 1.65*  CALCIUM 8.0*   Liver Function Tests: No results for input(s): AST, ALT, ALKPHOS, BILITOT, PROT, ALBUMIN in the last 168 hours. No results for input(s): LIPASE, AMYLASE in the last 168 hours. No results for input(s): AMMONIA in the last 168 hours. CBC:  Recent Labs Lab 05/22/16 1751  WBC 25.6*  HGB 8.2*  HCT 27.5*  MCV 68.2*  PLT 324   Cardiac Enzymes: No results for input(s): CKTOTAL, CKMB, CKMBINDEX, TROPONINI in the last 168 hours.  BNP (last 3 results) No results for input(s): BNP in the last 8760 hours.  ProBNP (last 3 results) No results for input(s): PROBNP in the last 8760 hours.  CBG: No results for input(s): GLUCAP in the last 168 hours.  Radiological Exams on Admission: No results found.     Assessment/Plan:      45 y.o. female with  Principal Problem:    Sepsis (HCC)   Sepsis Workup   IV Rocephin   IVFs   Active Problems:    Acute pyelonephritis   IV Rocephin      Hyperglycemia   SSI Coverage PRN   HbA1C      Hypertension   Monitor BPs    DVT Prophylaxis   Lovenox    Code Status:     FULL CODE        Family Communication:   No Family Present    Disposition Plan:    Observation Status        Time spent:  4970 Minutes      Ron ParkerJENKINS,Arshiya Jakes C [facility] Pager [phone]   If 7AM -7PM Please Contact the Day Rounding Team MD for [facility]  If 7PM-7AM, Please Contact Night-Floor Coverage  [email or web address] Password [email or web address] 05/22/2016, 8:56 PM     ADDENDUM:   Patient was seen and examined on 05/22/2016

## 2016-05-22 NOTE — ED Notes (Signed)
EKG given to HesterJosh, GeorgiaPA

## 2016-05-22 NOTE — ED Provider Notes (Signed)
CSN: [id number]     Arrival date & time 05/22/16  1720 History   First MD Initiated Contact with Patient 05/22/16 1736     Chief Complaint  Patient presents with  . Emesis  . Fever  . Dysuria     (Consider location/radiation/quality/duration/timing/severity/associated sxs/prior Treatment) HPI Comments: Patient presents with fever, hematuria, vomiting, and back pressure. Symptoms started 3 days ago patient noted blood in her urine. Symptoms worsens last night with fever and vomiting. She has not had significant dysuria or urgency. No history of pyelonephritis but she has had bladder infections in the past. No chest pain or cough. No abdominal pain or diarrhea. No rash. Denies vaginal bleeding or discharge. No treatments prior to arrival other than Tylenol last night. The onset of this condition was acute. The course is constant. Aggravating factors: none. Alleviating factors: none.    Patient is a 45 y.o. female presenting with vomiting, fever, and dysuria. The history is provided by the patient.  Emesis Associated symptoms: no abdominal pain, no chills, no diarrhea, no headaches, no myalgias and no sore throat   Fever Associated symptoms: dysuria, nausea and vomiting   Associated symptoms: no chest pain, no chills, no cough, no diarrhea, no headaches, no myalgias, no rash, no rhinorrhea and no sore throat   Dysuria Associated symptoms: fever, nausea and vomiting   Associated symptoms: no abdominal pain, no flank pain and no vaginal discharge     Past Medical History  Diagnosis Date  . Hypertension   . GERD (gastroesophageal reflux disease)    History reviewed. No pertinent past surgical history. Family History  Problem Relation Age of Onset  . Hypertension Mother   . Diabetes Mother   . Hypertension Father    Social History  Substance Use Topics  . Smoking status: Never Smoker   . Smokeless tobacco: None  . Alcohol Use: 7.0 oz/week    14 drink(s) per week     Comment: 2  glasses per of beer or liquor   OB History    No data available     Review of Systems  Constitutional: Positive for fever. Negative for chills.  HENT: Negative for rhinorrhea and sore throat.   Eyes: Negative for redness.  Respiratory: Negative for cough.   Cardiovascular: Negative for chest pain.  Gastrointestinal: Positive for nausea and vomiting. Negative for abdominal pain and diarrhea.  Genitourinary: Positive for dysuria and hematuria. Negative for urgency, flank pain, vaginal bleeding and vaginal discharge.  Musculoskeletal: Positive for back pain (pressure bilaterally). Negative for myalgias.  Skin: Negative for rash.  Neurological: Negative for headaches.      Allergies  Review of patient's allergies indicates no known allergies.  Home Medications   Prior to Admission medications   Medication Sig Start Date End Date Taking? Authorizing Provider  lisinopril-hydrochlorothiazide (PRINZIDE,ZESTORETIC) 20-25 MG per tablet Take 1 tablet by mouth daily.    Historical Provider, MD   BP 160/94 mmHg  Pulse 130  Temp(Src) 103.1 F (39.5 C) (Oral)  Resp 16  SpO2 98%  LMP 04/29/2016 Physical Exam  Constitutional: She appears well-developed and well-nourished.  Well-appearing  HENT:  Head: Normocephalic and atraumatic.  Mouth/Throat: Oropharynx is clear and moist.  Eyes: Conjunctivae are normal. Right eye exhibits no discharge. Left eye exhibits no discharge.  Neck: Normal range of motion. Neck supple.  Cardiovascular: Regular rhythm and normal heart sounds.  Tachycardia present.   No murmur heard. Pulmonary/Chest: Effort normal and breath sounds normal. No respiratory distress. She has no  wheezes. She has no rales.  Abdominal: Soft. Bowel sounds are normal. There is no tenderness. There is no rebound, no guarding and no CVA tenderness.  Neurological: She is alert.  Skin: Skin is warm and dry.  Psychiatric: She has a normal mood and affect.  Nursing note and vitals  reviewed.   ED Course  Procedures (including critical care time) Labs Review Labs Reviewed  URINALYSIS, ROUTINE W REFLEX MICROSCOPIC ([facility]) - Abnormal; Notable for the following:    Color, Urine AMBER (*)    APPearance TURBID (*)    Glucose, UA 100 (*)    Hgb urine dipstick MODERATE (*)    Protein, ur 100 (*)    Leukocytes, UA LARGE (*)    All other components within normal limits  BASIC METABOLIC PANEL - Abnormal; Notable for the following:    Sodium 131 (*)    Potassium 3.1 (*)    Chloride 95 (*)    Glucose, Bld 305 (*)    Creatinine, Ser 1.65 (*)    Calcium 8.0 (*)    GFR calc non Af Amer 37 (*)    GFR calc Af Amer 43 (*)    All other components within normal limits  CBC - Abnormal; Notable for the following:    WBC 25.6 (*)    Hemoglobin 8.2 (*)    HCT 27.5 (*)    MCV 68.2 (*)    MCH 20.3 (*)    MCHC 29.8 (*)    RDW 21.1 (*)    All other components within normal limits  URINE MICROSCOPIC-ADD ON - Abnormal; Notable for the following:    Squamous Epithelial / LPF 6-30 (*)    Bacteria, UA MANY (*)    Casts GRANULAR CAST (*)    All other components within normal limits  I-STAT CG4 LACTIC ACID, ED - Abnormal; Notable for the following:    Lactic Acid, Venous 5.72 (*)    All other components within normal limits  URINE CULTURE  CULTURE, BLOOD (ROUTINE X 2)  CULTURE, BLOOD (ROUTINE X 2)  HEMOGLOBIN A1C  I-STAT BETA HCG BLOOD, ED ([facility], [facility], [facility] ONLY)  I-STAT CG4 LACTIC ACID, ED     EKG Interpretation   Date/Time:  Sunday May 22 2016 18:14:10 EDT Ventricular Rate:  125 PR Interval:  134 QRS Duration: 81 QT Interval:  294 QTC Calculation: 424 R Axis:   29 Text Interpretation:  Age not entered, assumed to be  45 years old for  purpose of ECG interpretation Sinus tachycardia Left ventricular  hypertrophy Borderline T abnormalities, lateral leads Abnormal ekg  Confirmed by BEATON  MD, ROBERT ([id number]) on 05/22/2016 7:47:15 PM      5:44 PM Patient seen and  examined. Patient has clinical pyelonephritis. Sepsis protocol ordered including lactate, cultures, fluids and antibiotics. However, as I doubt patient will need admission, did not call Code Sepsis. Patient appears clinically well, no active vomiting.   Vital signs reviewed and are as follows: BP 160/94 mmHg  Pulse 130  Temp(Src) 103.1 F (39.5 C) (Oral)  Resp 16  SpO2 98%  LMP 04/29/2016   6:12 PM Lactate is substantially elevated. Given this, will call code sepsis and admit. Discussed with Dr. Radford PaxBeaton.    Sepsis - Repeat Assessment  Performed at:    20:42  Vitals     Blood pressure 112/63, pulse 105, temperature 100.4 F (38 C), temperature source Oral, resp. rate 24, height 5\' 3"  (1.6 m), weight 83.462 kg, last menstrual period  04/29/2016, SpO2 98 %.  Heart:     Tachycardic  Lungs:    CTA  Capillary Refill:   <2 sec  Peripheral Pulse:   Radial pulse palpable  Skin:     Normal Color  8:42 PM Patient is feeling well. Pending admission.   8:55 PM Spoke with Dr. Lovell Sheehan who will admit.   CRITICAL CARE Performed by: Carolee Rota Total critical care time: 40 minutes Critical care time was exclusive of separately billable procedures and treating other patients. Critical care was necessary to treat or prevent imminent or life-threatening deterioration. Critical care was time spent personally by me on the following activities: development of treatment plan with patient and/or surrogate as well as nursing, discussions with consultants, evaluation of patient's response to treatment, examination of patient, obtaining history from patient or surrogate, ordering and performing treatments and interventions, ordering and review of laboratory studies, ordering and review of radiographic studies, pulse oximetry and re-evaluation of patient's condition.    MDM   Final diagnoses:  Pyelonephritis  Sepsis, due to unspecified organism (HCC)  Hyperglycemia   Admit.     Renne Crigler, PA-C 05/22/16 2056  Renne Crigler, PA-C 05/23/16 0100  Nelva Nay, MD 06/03/16 0900

## 2016-05-23 ENCOUNTER — Encounter (HOSPITAL_COMMUNITY): Payer: Self-pay | Admitting: Radiology

## 2016-05-23 ENCOUNTER — Observation Stay (HOSPITAL_COMMUNITY): Payer: Self-pay

## 2016-05-23 LAB — BLOOD CULTURE ID PANEL (REFLEXED)
ACINETOBACTER BAUMANNII: NOT DETECTED
CANDIDA GLABRATA: NOT DETECTED
CANDIDA KRUSEI: NOT DETECTED
CANDIDA PARAPSILOSIS: NOT DETECTED
Candida albicans: NOT DETECTED
Candida tropicalis: NOT DETECTED
Carbapenem resistance: NOT DETECTED
ENTEROBACTERIACEAE SPECIES: DETECTED — AB
ESCHERICHIA COLI: DETECTED — AB
Enterobacter cloacae complex: NOT DETECTED
Enterococcus species: NOT DETECTED
Haemophilus influenzae: NOT DETECTED
KLEBSIELLA OXYTOCA: NOT DETECTED
KLEBSIELLA PNEUMONIAE: NOT DETECTED
Listeria monocytogenes: NOT DETECTED
Methicillin resistance: NOT DETECTED
NEISSERIA MENINGITIDIS: NOT DETECTED
PSEUDOMONAS AERUGINOSA: NOT DETECTED
Proteus species: NOT DETECTED
SERRATIA MARCESCENS: NOT DETECTED
STREPTOCOCCUS AGALACTIAE: NOT DETECTED
STREPTOCOCCUS SPECIES: NOT DETECTED
Staphylococcus aureus (BCID): NOT DETECTED
Staphylococcus species: NOT DETECTED
Streptococcus pneumoniae: NOT DETECTED
Streptococcus pyogenes: NOT DETECTED
Vancomycin resistance: NOT DETECTED

## 2016-05-23 LAB — CBC
HCT: 26.4 % — ABNORMAL LOW (ref 36.0–46.0)
Hemoglobin: 8 g/dL — ABNORMAL LOW (ref 12.0–15.0)
MCH: 20.6 pg — ABNORMAL LOW (ref 26.0–34.0)
MCHC: 30.3 g/dL (ref 30.0–36.0)
MCV: 68 fL — AB (ref 78.0–100.0)
PLATELETS: 281 10*3/uL (ref 150–400)
RBC: 3.88 MIL/uL (ref 3.87–5.11)
RDW: 21.1 % — AB (ref 11.5–15.5)
WBC: 20.4 10*3/uL — AB (ref 4.0–10.5)

## 2016-05-23 LAB — BASIC METABOLIC PANEL
Anion gap: 8 (ref 5–15)
BUN: 17 mg/dL (ref 6–20)
CALCIUM: 7.1 mg/dL — AB (ref 8.9–10.3)
CHLORIDE: 99 mmol/L — AB (ref 101–111)
CO2: 24 mmol/L (ref 22–32)
CREATININE: 1.39 mg/dL — AB (ref 0.44–1.00)
GFR calc non Af Amer: 46 mL/min — ABNORMAL LOW (ref >60)
GFR, EST AFRICAN AMERICAN: 53 mL/min — AB (ref >60)
Glucose, Bld: 175 mg/dL — ABNORMAL HIGH (ref 65–99)
Potassium: 3.3 mmol/L — ABNORMAL LOW (ref 3.5–5.1)
SODIUM: 131 mmol/L — AB (ref 135–145)

## 2016-05-23 LAB — GLUCOSE, CAPILLARY
GLUCOSE-CAPILLARY: 161 mg/dL — AB (ref 65–99)
GLUCOSE-CAPILLARY: 140 mg/dL — AB (ref 65–99)
Glucose-Capillary: 164 mg/dL — ABNORMAL HIGH (ref 65–99)
Glucose-Capillary: 116 mg/dL — ABNORMAL HIGH (ref 65–99)

## 2016-05-23 LAB — LACTIC ACID, PLASMA: LACTIC ACID, VENOUS: 2.7 mmol/L — AB (ref 0.5–2.0)

## 2016-05-23 MED ORDER — ALUM & MAG HYDROXIDE-SIMETH 200-200-20 MG/5ML PO SUSP
30.0000 mL | Freq: Four times a day (QID) | ORAL | Status: DC | PRN
  Administered 2016-05-23 – 2016-05-24 (×2): 30 mL via ORAL
  Filled 2016-05-23 (×2): qty 30

## 2016-05-23 MED ORDER — POTASSIUM CHLORIDE CRYS ER 20 MEQ PO TBCR
20.0000 meq | EXTENDED_RELEASE_TABLET | Freq: Once | ORAL | Status: AC
  Administered 2016-05-23: 20 meq via ORAL
  Filled 2016-05-23: qty 1

## 2016-05-23 MED ORDER — DIPHENHYDRAMINE HCL 50 MG PO CAPS
50.0000 mg | ORAL_CAPSULE | Freq: Once | ORAL | Status: AC
  Administered 2016-05-23: 50 mg via ORAL
  Filled 2016-05-23: qty 1

## 2016-05-23 MED ORDER — CEFTRIAXONE SODIUM 2 G IJ SOLR
2.0000 g | INTRAMUSCULAR | Status: DC
  Administered 2016-05-23 – 2016-05-25 (×3): 2 g via INTRAVENOUS
  Filled 2016-05-23 (×3): qty 2

## 2016-05-23 MED ORDER — SODIUM CHLORIDE 0.9 % IV BOLUS (SEPSIS)
500.0000 mL | Freq: Once | INTRAVENOUS | Status: AC
  Administered 2016-05-23: 500 mL via INTRAVENOUS

## 2016-05-23 NOTE — Progress Notes (Signed)
PHARMACY - PHYSICIAN COMMUNICATION CRITICAL VALUE ALERT - BLOOD CULTURE IDENTIFICATION (BCID)   Labs reports that 3 blood cultures positive with Ecoli  Name of physician (or Provider) Contacted: not necessary as patient on Rocephin. Will await results of culture  Changes to prescribed antibiotics required: none  Berkley HarveyLegge, Deshawnda Acrey Marshall 05/23/2016  1:11 PM

## 2016-05-23 NOTE — Progress Notes (Signed)
PROGRESS NOTE    Allison [name]  ZOX:[id number] DOB: [birth year] DOA: 05/22/2016 PCP: No primary care provider on file.   Brief Narrative: Allison [name] is a 45 y.o. female with a history of HTN and GERD who presents to the ED with complaints of dysuria and lower ABD and Back Pain x 2 weeks and fevers and chills and nausea and Vomiting for the past 24 hours. She was found to have a fever to 103.1 in the ED along with a positive UA and elevated BUN/Cr elevated Glucose level of 308 which is new for her.    Assessment & Plan:   Principal Problem:   Sepsis (HCC) Active Problems:   Acute pyelonephritis   Hyperglycemia   Hypertension  Sepsis; Pyelonephritis.  Lactic acid on admission at 5 trending down at 2.7 WBC trending down from 25---20. Blood culture pending.  Urine culture pending.  IV fluids, and IV bolus.  Check CT renal stone protocol.   AKI; Last Cr per records at 0.6. Continue with IV fluids.  Suspect related to sepsis, Infection.   Hypokalemia;  Replete orally.   Pyelonephritis: Continue with IV ceftriaxone.   Hyperglycemia:  Hb-A1c pending.  No prior history of Diabetes, likely new diagnosis.   HTN; hold BP medication.     DVT prophylaxis: Lovenox Code Status: Full Code.  Family Communication:  Disposition Plan:  Home when sepsis resolved and cultures available.    Consultants:   none  Procedures:   none  Antimicrobials:   Ceftriaxone 5-28   Subjective: Feels better, pain better   Objective: Filed Vitals:   05/22/16 2130 05/22/16 2211 05/23/16 0156 05/23/16 0551  BP: 113/66 131/82  116/77  Pulse: 96 107  119  Temp:  99.7 F (37.6 C) 98.6 F (37 C) 101.7 F (38.7 C)  TempSrc:  Oral Oral Oral  Resp:  16  16  Height:   (1.6 m)    Weight:  84.3 kg (185 lb 13.6 oz)    SpO2: 100% 100%  99%    Intake/Output Summary (Last 24 hours) at 05/23/16 0744 Last data filed at 05/23/16 0600  Gross per 24 hour  Intake   2095 ml    Output      0 ml  Net   2095 ml   Filed Weights   05/22/16 1813 05/22/16 2211  Weight: 83.462 kg (184 lb) 84.3 kg (185 lb 13.6 oz)    Examination:  General exam: Appears calm and comfortable  Respiratory system: Clear to auscultation. Respiratory effort normal. Cardiovascular system: S1 & S2 heard, RRR. No JVD, murmurs, rubs, gallops or clicks. No pedal edema. Gastrointestinal system: Abdomen is nondistended, soft and nontender. No organomegaly or masses felt. Normal bowel sounds heard. Central nervous system: Alert and oriented. No focal neurological deficits. Extremities: Symmetric 5 x 5 power. Skin: No rashes, lesions or ulcers Psychiatry: Judgement and insight appear normal. Mood & affect appropriate.     Data Reviewed: I have personally reviewed following labs and imaging studies  CBC:  Recent Labs Lab 05/22/16 1751 05/23/16 0114  WBC 25.6* 20.4*  HGB 8.2* 8.0*  HCT 27.5* 26.4*  MCV 68.2* 68.0*  PLT 324 281   Basic Metabolic Panel:  Recent Labs Lab 05/22/16 1751 05/23/16 0114  NA 131* 131*  K 3.1* 3.3*  CL 95* 99*  CO2 24 24  GLUCOSE 305* 175*  BUN 16 17  CREATININE 1.65* 1.39*  CALCIUM 8.0* 7.1*   GFR: Estimated Creatinine Clearance: 53.7 mL/min (by  C-G formula based on Cr of 1.39). Liver Function Tests: No results for input(s): AST, ALT, ALKPHOS, BILITOT, PROT, ALBUMIN in the last 168 hours. No results for input(s): LIPASE, AMYLASE in the last 168 hours. No results for input(s): AMMONIA in the last 168 hours. Coagulation Profile: No results for input(s): INR, PROTIME in the last 168 hours. Cardiac Enzymes: No results for input(s): CKTOTAL, CKMB, CKMBINDEX, TROPONINI in the last 168 hours. BNP (last 3 results) No results for input(s): PROBNP in the last 8760 hours. HbA1C: No results for input(s): HGBA1C in the last 72 hours. CBG:  Recent Labs Lab 05/22/16 2223  GLUCAP 179*   Lipid Profile: No results for input(s): CHOL, HDL, LDLCALC,  TRIG, CHOLHDL, LDLDIRECT in the last 72 hours. Thyroid Function Tests: No results for input(s): TSH, T4TOTAL, FREET4, T3FREE, THYROIDAB in the last 72 hours. Anemia Panel: No results for input(s): VITAMINB12, FOLATE, FERRITIN, TIBC, IRON, RETICCTPCT in the last 72 hours. Sepsis Labs:  Recent Labs Lab 05/22/16 1800 05/22/16 2115 05/22/16 2226 05/23/16 0114  PROCALCITON  --   --  10.63  --   LATICACIDVEN 5.72* 2.65* 2.1* 2.7*    No results found for this or any previous visit (from the past 240 hour(s)).       Radiology Studies: No results found.      Scheduled Meds: . cefTRIAXone (ROCEPHIN)  IV  1 g Intravenous Q24H  . enoxaparin (LOVENOX) injection  40 mg Subcutaneous QHS  . insulin aspart  0-5 Units Subcutaneous QHS  . insulin aspart  0-9 Units Subcutaneous TID WC  . ondansetron  4 mg Intravenous Once  . sodium chloride  500 mL Intravenous Once   Continuous Infusions: . sodium chloride 100 mL/hr at 05/23/16 0250        Time spent: 35 minutes.     Alba Coryegalado, Ebonye Reade A, MD [facility] Pager [phone]  If 7PM-7AM, please contact night-coverage [email or web address] Password [email or web address] 05/23/2016, 7:44 AM

## 2016-05-24 ENCOUNTER — Inpatient Hospital Stay (HOSPITAL_COMMUNITY): Payer: Self-pay

## 2016-05-24 LAB — BASIC METABOLIC PANEL
ANION GAP: 9 (ref 5–15)
BUN: 18 mg/dL (ref 6–20)
CHLORIDE: 100 mmol/L — AB (ref 101–111)
CO2: 21 mmol/L — ABNORMAL LOW (ref 22–32)
Calcium: 6.9 mg/dL — ABNORMAL LOW (ref 8.9–10.3)
Creatinine, Ser: 1.2 mg/dL — ABNORMAL HIGH (ref 0.44–1.00)
GFR calc Af Amer: >60 mL/min (ref >60)
GFR, EST NON AFRICAN AMERICAN: 55 mL/min — AB (ref >60)
GLUCOSE: 128 mg/dL — AB (ref 65–99)
POTASSIUM: 3.2 mmol/L — AB (ref 3.5–5.1)
Sodium: 130 mmol/L — ABNORMAL LOW (ref 135–145)

## 2016-05-24 LAB — CBC
HEMATOCRIT: 23.8 % — AB (ref 36.0–46.0)
HEMOGLOBIN: 7.3 g/dL — AB (ref 12.0–15.0)
MCH: 20.7 pg — ABNORMAL LOW (ref 26.0–34.0)
MCHC: 30.7 g/dL (ref 30.0–36.0)
MCV: 67.4 fL — AB (ref 78.0–100.0)
PLATELETS: 287 10*3/uL (ref 150–400)
RBC: 3.53 MIL/uL — AB (ref 3.87–5.11)
RDW: 21 % — ABNORMAL HIGH (ref 11.5–15.5)
WBC: 12.5 10*3/uL — AB (ref 4.0–10.5)

## 2016-05-24 LAB — GLUCOSE, CAPILLARY
GLUCOSE-CAPILLARY: 118 mg/dL — AB (ref 65–99)
GLUCOSE-CAPILLARY: 163 mg/dL — AB (ref 65–99)
Glucose-Capillary: 136 mg/dL — ABNORMAL HIGH (ref 65–99)
Glucose-Capillary: 104 mg/dL — ABNORMAL HIGH (ref 65–99)

## 2016-05-24 LAB — URINE CULTURE

## 2016-05-24 LAB — LACTIC ACID, PLASMA: Lactic Acid, Venous: 1.7 mmol/L (ref 0.5–2.0)

## 2016-05-24 LAB — HEMOGLOBIN A1C
Hgb A1c MFr Bld: 8.2 % — ABNORMAL HIGH (ref 4.8–5.6)
Mean Plasma Glucose: 189 mg/dL

## 2016-05-24 LAB — OCCULT BLOOD X 1 CARD TO LAB, STOOL: Fecal Occult Bld: NEGATIVE

## 2016-05-24 MED ORDER — GADOBENATE DIMEGLUMINE 529 MG/ML IV SOLN
20.0000 mL | Freq: Once | INTRAVENOUS | Status: AC | PRN
  Administered 2016-05-24: 17 mL via INTRAVENOUS

## 2016-05-24 NOTE — Care Management Note (Signed)
Case Management Note  Patient Details  Name: Allison [name] MRN: [id number] Date of Birth: 1971-04-29  Subjective/Objective:       45 yo admitted with Sepsis             Action/Plan: From home with spouse.  Chart reviewed and no CM needs identified or communicated at this time. CM will continue to follow.  Expected Discharge Date:  05/27/16               Expected Discharge Plan:  Home/Self Care  In-House Referral:     Discharge planning Services  CM Consult  Post Acute Care Choice:    Choice offered to:     DME Arranged:    DME Agency:     HH Arranged:    HH Agency:     Status of Service:  In process, will continue to follow  Medicare Important Message Given:    Date Medicare IM Given:    Medicare IM give by:    Date Additional Medicare IM Given:    Additional Medicare Important Message give by:     If discussed at Long Length of Stay Meetings, dates discussed:    Additional CommentsBartholome Bill:  Parlee Amescua H, RN 05/24/2016, 12:50 PM  [phone]

## 2016-05-24 NOTE — Progress Notes (Signed)
PROGRESS NOTE    [name]  ZOX:[id number] DOB: Jul 06, 1971 DOA: 05/22/2016 PCP: No primary care provider on file.   Brief Narrative: Allison [name] is a 45 y.o. female with a history of HTN and GERD who presents to the ED with complaints of dysuria and lower ABD and Back Pain x 2 weeks and fevers and chills and nausea and Vomiting for the past 24 hours. She was found to have a fever to 103.1 in the ED along with a positive UA and elevated BUN/Cr elevated Glucose level of 308 which is new for her.    Assessment & Plan:   Principal Problem:   Sepsis (HCC) Active Problems:   Acute pyelonephritis   Pyelonephritis   Hyperglycemia   Hypertension  Sepsis; Pyelonephritis. E coli Bacteremia;  Lactic acid on admission at 5 trending down at 2.7 WBC trending down from 25---20. Blood culture growing E. Coli.  Urine culture 10, ooo colonies.  IV fluids, and IV bolus.  CT renal stone protocol : No definite hydronephrosis or renal calculi are noted. Multiple calcifications are noted in the pelvis most consistent with phleboliths, but the possibility of distal ureteral calculi cannot be excluded. No significant ureteral dilatation is noted. Left perinephric stranding is noted consistent with the patient's history of pyelonephritis. However, also noted as 5.4 x 2.6 cm soft tissue prominence arising from the upper pole of left kidney ; the possibility of this representing mass or neoplasm cannot be excluded, and further evaluation with MRI or CT scan with contrast administration is recommended. 4 cm rounded hypodensity seen in left ovary most consistent withovarian cyst. Pelvic ultrasound is recommended for further evaluation.  MRI ordered to further evaluate CT finding.   AKI; Last Cr per records at 0.6. Cr on admission at 1.6.  Continue with IV fluids.  Suspect related to sepsis, Infection.  Improving.   Hypokalemia;  Replete orally.   Pyelonephritis: Continue with IV  ceftriaxone.   Hyperglycemia:  Hb-A1c pending.  No prior history of Diabetes, likely new diagnosis.   HTN; hold BP medication.   Anemia; Check Anemia panel. Stool for occult blood.   DVT prophylaxis: Lovenox Code Status: Full Code.  Family Communication:  Disposition Plan:  Home when sepsis resolved and cultures available.    Consultants:   none  Procedures:   none  Antimicrobials:   Ceftriaxone 5-28   Subjective: Feels better, pain better . Was not aware that she has anemia and history off.   Objective: Filed Vitals:   05/24/16 0745 05/24/16 0935 05/24/16 1055 05/24/16 1315  BP:  151/99  135/88  Pulse:  98  93  Temp: 98.7 F (37.1 C) 101.1 F (38.4 C) 101.4 F (38.6 C) 99 F (37.2 C)  TempSrc: Oral Oral Oral Oral  Resp:  16  16  Height:      Weight:      SpO2:  98%  97%    Intake/Output Summary (Last 24 hours) at 05/24/16 1443 Last data filed at 05/24/16 1230  Gross per 24 hour  Intake   3390 ml  Output      0 ml  Net   3390 ml   Filed Weights   05/22/16 1813 05/22/16 2211  Weight: 83.462 kg (184 lb) 84.3 kg (185 lb 13.6 oz)    Examination:  General exam: Appears calm and comfortable  Respiratory system: Clear to auscultation. Respiratory effort normal. Cardiovascular system: S1 & S2 heard, RRR. No JVD, murmurs, rubs, gallops or clicks. No pedal  edema. Gastrointestinal system: Abdomen is nondistended, soft and nontender. No organomegaly or masses felt. Normal bowel sounds heard. Central nervous system: Alert and oriented. No focal neurological deficits. Extremities: Symmetric 5 x 5 power. Skin: No rashes, lesions or ulcers Psychiatry: Judgement and insight appear normal. Mood & affect appropriate.     Data Reviewed: I have personally reviewed following labs and imaging studies  CBC:  Recent Labs Lab 05/22/16 1751 05/23/16 0114 05/24/16 0344  WBC 25.6* 20.4* 12.5*  HGB 8.2* 8.0* 7.3*  HCT 27.5* 26.4* 23.8*  MCV 68.2* 68.0* 67.4*    PLT 324 281 287   Basic Metabolic Panel:  Recent Labs Lab 05/22/16 1751 05/23/16 0114 05/24/16 0344  NA 131* 131* 130*  K 3.1* 3.3* 3.2*  CL 95* 99* 100*  CO2 24 24 21*  GLUCOSE 305* 175* 128*  BUN CREATININE 1.65* 1.39* 1.20*  CALCIUM 8.0* 7.1* 6.9*   GFR: Estimated Creatinine Clearance: 62.2 mL/min (by C-G formula based on Cr of 1.2). Liver Function Tests: No results for input(s): AST, ALT, ALKPHOS, BILITOT, PROT, ALBUMIN in the last 168 hours. No results for input(s): LIPASE, AMYLASE in the last 168 hours. No results for input(s): AMMONIA in the last 168 hours. Coagulation Profile: No results for input(s): INR, PROTIME in the last 168 hours. Cardiac Enzymes: No results for input(s): CKTOTAL, CKMB, CKMBINDEX, TROPONINI in the last 168 hours. BNP (last 3 results) No results for input(s): PROBNP in the last 8760 hours. HbA1C:  Recent Labs  05/22/16 1751  HGBA1C 8.2*   CBG:  Recent Labs Lab 05/23/16 1213 05/23/16 1726 05/23/16 2103 05/24/16 0801 05/24/16 1127  GLUCAP 161* 116* 140* 118* 136*   Lipid Profile: No results for input(s): CHOL, HDL, LDLCALC, TRIG, CHOLHDL, LDLDIRECT in the last 72 hours. Thyroid Function Tests: No results for input(s): TSH, T4TOTAL, FREET4, T3FREE, THYROIDAB in the last 72 hours. Anemia Panel: No results for input(s): VITAMINB12, FOLATE, FERRITIN, TIBC, IRON, RETICCTPCT in the last 72 hours. Sepsis Labs:  Recent Labs Lab 05/22/16 2115 05/22/16 2226 05/23/16 0114 05/24/16 0344  PROCALCITON  --  10.63  --   --   LATICACIDVEN 2.65* 2.1* 2.7* 1.7    Recent Results (from the past 240 hour(s))  Blood Culture (routine x 2)     Status: Abnormal (Preliminary result)   Collection Time: 05/22/16  5:51 PM  Result Value Ref Range Status   Specimen Description BLOOD LEFT ANTECUBITAL  Final   Special Requests BOTTLES DRAWN AEROBIC AND ANAEROBIC 5CC  Final   Culture  Setup Time   Final    GRAM NEGATIVE RODS IN BOTH  AEROBIC AND ANAEROBIC BOTTLES CRITICAL RESULT CALLED TO, READ BACK BY AND VERIFIED WITH: J LEGGE,PHARMD AT 1310 05/23/16 BY L BENFIELD Performed at [facility]    Culture ESCHERICHIA COLI (A)  Final   Report Status PENDING  Incomplete  Blood Culture ID Panel (Reflexed)     Status: Abnormal   Collection Time: 05/22/16  5:51 PM  Result Value Ref Range Status   Enterococcus species NOT DETECTED NOT DETECTED Final   Vancomycin resistance NOT DETECTED NOT DETECTED Final   Listeria monocytogenes NOT DETECTED NOT DETECTED Final   Staphylococcus species NOT DETECTED NOT DETECTED Final   Staphylococcus aureus NOT DETECTED NOT DETECTED Final   Methicillin resistance NOT DETECTED NOT DETECTED Final   Streptococcus species NOT DETECTED NOT DETECTED Final   Streptococcus agalactiae NOT DETECTED NOT DETECTED Final   Streptococcus pneumoniae NOT DETECTED NOT  DETECTED Final   Streptococcus pyogenes NOT DETECTED NOT DETECTED Final   Acinetobacter baumannii NOT DETECTED NOT DETECTED Final   Enterobacteriaceae species DETECTED (A) NOT DETECTED Final    Comment: CRITICAL RESULT CALLED TO, READ BACK BY AND VERIFIED WITH: J LEGGE,PHARMD AT 1310 05/23/16 BY L BENFIELD    Enterobacter cloacae complex NOT DETECTED NOT DETECTED Final   Escherichia coli DETECTED (A) NOT DETECTED Final    Comment: CRITICAL RESULT CALLED TO, READ BACK BY AND VERIFIED WITH: J LEGGE,PHARMD AT 1310 05/23/16 BY L BENFIELD    Klebsiella oxytoca NOT DETECTED NOT DETECTED Final   Klebsiella pneumoniae NOT DETECTED NOT DETECTED Final   Proteus species NOT DETECTED NOT DETECTED Final   Serratia marcescens NOT DETECTED NOT DETECTED Final   Carbapenem resistance NOT DETECTED NOT DETECTED Final   Haemophilus influenzae NOT DETECTED NOT DETECTED Final   Neisseria meningitidis NOT DETECTED NOT DETECTED Final   Pseudomonas aeruginosa NOT DETECTED NOT DETECTED Final   Candida albicans NOT DETECTED NOT DETECTED Final   Candida glabrata  NOT DETECTED NOT DETECTED Final   Candida krusei NOT DETECTED NOT DETECTED Final   Candida parapsilosis NOT DETECTED NOT DETECTED Final   Candida tropicalis NOT DETECTED NOT DETECTED Final    Comment: Performed at [facility]  Blood Culture (routine x 2)     Status: None (Preliminary result)   Collection Time: 05/22/16  6:04 PM  Result Value Ref Range Status   Specimen Description BLOOD RIGHT ANTECUBITAL  Final   Special Requests BOTTLES DRAWN AEROBIC AND ANAEROBIC 5CC  Final   Culture  Setup Time   Final    GRAM NEGATIVE RODS IN BOTH AEROBIC AND ANAEROBIC BOTTLES CRITICAL RESULT CALLED TO, READ BACK BY AND VERIFIED WITH: J LEGGE,PHARMD AT 1310 05/23/16 BY L BENFIELD Performed at [facility]    Culture GRAM NEGATIVE RODS  Final   Report Status PENDING  Incomplete  Urine C&S     Status: Abnormal   Collection Time: 05/22/16  6:50 PM  Result Value Ref Range Status   Specimen Description URINE, CLEAN CATCH  Final   Special Requests NONE  Final   Culture (A)  Final    <10,000 COLONIES/mL INSIGNIFICANT GROWTH Performed at [facility]    Report Status 05/24/2016 FINAL  Final         Radiology Studies: Ct Renal Stone Study  05/23/2016  CLINICAL DATA:  Dysuria, gross hematuria. EXAM: CT ABDOMEN AND PELVIS WITHOUT CONTRAST TECHNIQUE: Multidetector CT imaging of the abdomen and pelvis was performed following the standard protocol without IV contrast. COMPARISON:  None. FINDINGS: Visualized lung bases are unremarkable. No significant osseous abnormality is noted. Fatty infiltration of the liver is noted. The spleen and pancreas appear normal. No gallstones are noted. Adrenal glands appear normal. Left perinephric stranding is noted. Possible mass measuring 5.4 x 2.6 cm is seen involving the superior pole of the left kidney. No definite hydronephrosis is noted on either side, there is no definite evidence renal calculi. Multiple calcifications are noted in pelvis most  consistent with phleboliths, but the possibility of 1 of these representing ureteral calculi cannot be excluded as the ureters cannot be definitively identified in the distal portions. Urinary bladder is lead minimally distended. The appendix appears normal. There is no evidence of bowel obstruction. No abnormal fluid collection is noted. Small fat containing periumbilical hernia is noted. Uterus and right ovary appear normal. 4 cm left ovarian cyst is noted. Mildly enlarged left retroperitoneal lymph  nodes are noted with the largest measuring 13 x 8 mm. These most likely are inflammatory in etiology. IMPRESSION: Fatty infiltration of the liver. No definite hydronephrosis or renal calculi are noted. Multiple calcifications are noted in the pelvis most consistent with phleboliths, but the possibility of distal ureteral calculi cannot be excluded. No significant ureteral dilatation is noted. Small fat containing periumbilical hernia. 4 cm rounded hypodensity seen in left ovary most consistent with ovarian cyst. Pelvic ultrasound is recommended for further evaluation. Left perinephric stranding is noted consistent with the patient's history of pyelonephritis. However, also noted as 5.4 x 2.6 cm soft tissue prominence arising from the upper pole of left kidney ; the possibility of this representing mass or neoplasm cannot be excluded, and further evaluation with MRI or CT scan with contrast administration is recommended. Electronically Signed   By: Lupita RaiderJames  Green Jr, M.D.   On: 05/23/2016 14:23        Scheduled Meds: . cefTRIAXone (ROCEPHIN)  IV  2 g Intravenous Q24H  . enoxaparin (LOVENOX) injection  40 mg Subcutaneous QHS  . insulin aspart  0-5 Units Subcutaneous QHS  . insulin aspart  0-9 Units Subcutaneous TID WC  . ondansetron  4 mg Intravenous Once   Continuous Infusions: . sodium chloride 100 mL/hr at 05/24/16 1324     LOS: 1 day    Time spent: 35 minutes.     Alba Coryegalado, Drisana Schweickert A, MD [facility] Pager [phone]  If 7PM-7AM, please contact night-coverage [email or web address] Password [email or web address] 05/24/2016, 2:43 PM

## 2016-05-25 ENCOUNTER — Encounter (HOSPITAL_COMMUNITY): Payer: Self-pay | Admitting: *Deleted

## 2016-05-25 DIAGNOSIS — E119 Type 2 diabetes mellitus without complications: Secondary | ICD-10-CM

## 2016-05-25 DIAGNOSIS — I10 Essential (primary) hypertension: Secondary | ICD-10-CM

## 2016-05-25 DIAGNOSIS — N10 Acute tubulo-interstitial nephritis: Secondary | ICD-10-CM

## 2016-05-25 DIAGNOSIS — A419 Sepsis, unspecified organism: Secondary | ICD-10-CM

## 2016-05-25 LAB — HEPATIC FUNCTION PANEL
ALK PHOS: 128 U/L — AB (ref 38–126)
ALT: 33 U/L (ref 14–54)
AST: 98 U/L — ABNORMAL HIGH (ref 15–41)
Albumin: 2.4 g/dL — ABNORMAL LOW (ref 3.5–5.0)
BILIRUBIN DIRECT: 0.4 mg/dL (ref 0.1–0.5)
BILIRUBIN TOTAL: 0.8 mg/dL (ref 0.3–1.2)
Indirect Bilirubin: 0.4 mg/dL (ref 0.3–0.9)
Total Protein: 6.8 g/dL (ref 6.5–8.1)

## 2016-05-25 LAB — CBC
HCT: 22 % — ABNORMAL LOW (ref 36.0–46.0)
HCT: 26.8 % — ABNORMAL LOW (ref 36.0–46.0)
Hemoglobin: 6.7 g/dL — CL (ref 12.0–15.0)
Hemoglobin: 8.2 g/dL — ABNORMAL LOW (ref 12.0–15.0)
MCH: 20.2 pg — ABNORMAL LOW (ref 26.0–34.0)
MCH: 20.7 pg — AB (ref 26.0–34.0)
MCHC: 30.5 g/dL (ref 30.0–36.0)
MCHC: 30.6 g/dL (ref 30.0–36.0)
MCV: 66.3 fL — AB (ref 78.0–100.0)
MCV: 67.5 fL — ABNORMAL LOW (ref 78.0–100.0)
PLATELETS: 272 10*3/uL (ref 150–400)
PLATELETS: 316 10*3/uL (ref 150–400)
RBC: 3.32 MIL/uL — AB (ref 3.87–5.11)
RBC: 3.97 MIL/uL (ref 3.87–5.11)
RDW: 20.6 % — AB (ref 11.5–15.5)
RDW: 21.7 % — ABNORMAL HIGH (ref 11.5–15.5)
WBC: 10 10*3/uL (ref 4.0–10.5)
WBC: 9.8 10*3/uL (ref 4.0–10.5)

## 2016-05-25 LAB — BASIC METABOLIC PANEL
ANION GAP: 8 (ref 5–15)
Anion gap: 6 (ref 5–15)
BUN: 10 mg/dL (ref 6–20)
BUN: 7 mg/dL (ref 6–20)
CALCIUM: 7 mg/dL — AB (ref 8.9–10.3)
CALCIUM: 7.4 mg/dL — AB (ref 8.9–10.3)
CO2: 23 mmol/L (ref 22–32)
CO2: 25 mmol/L (ref 22–32)
CREATININE: 0.76 mg/dL (ref 0.44–1.00)
Chloride: 101 mmol/L (ref 101–111)
Chloride: 102 mmol/L (ref 101–111)
Creatinine, Ser: 0.81 mg/dL (ref 0.44–1.00)
GFR calc Af Amer: >60 mL/min (ref >60)
Glucose, Bld: 108 mg/dL — ABNORMAL HIGH (ref 65–99)
Glucose, Bld: 129 mg/dL — ABNORMAL HIGH (ref 65–99)
POTASSIUM: 2.9 mmol/L — AB (ref 3.5–5.1)
POTASSIUM: 3.3 mmol/L — AB (ref 3.5–5.1)
SODIUM: 133 mmol/L — AB (ref 135–145)
Sodium: 132 mmol/L — ABNORMAL LOW (ref 135–145)

## 2016-05-25 LAB — CULTURE, BLOOD (ROUTINE X 2)

## 2016-05-25 LAB — PREPARE RBC (CROSSMATCH)

## 2016-05-25 LAB — ABO/RH: ABO/RH(D): O POS

## 2016-05-25 LAB — VITAMIN B12: VITAMIN B 12: 795 pg/mL (ref 180–914)

## 2016-05-25 LAB — RETICULOCYTES
RBC.: 3.32 MIL/uL — ABNORMAL LOW (ref 3.87–5.11)
RETIC COUNT ABSOLUTE: 23.2 10*3/uL (ref 19.0–186.0)
RETIC CT PCT: 0.7 % (ref 0.4–3.1)

## 2016-05-25 LAB — FERRITIN: FERRITIN: 105 ng/mL (ref 11–307)

## 2016-05-25 LAB — IRON AND TIBC
IRON: 9 ug/dL — AB (ref 28–170)
Saturation Ratios: 3 % — ABNORMAL LOW (ref 10.4–31.8)
TIBC: 309 ug/dL (ref 250–450)
UIBC: 300 ug/dL

## 2016-05-25 LAB — GLUCOSE, CAPILLARY
GLUCOSE-CAPILLARY: 110 mg/dL — AB (ref 65–99)
Glucose-Capillary: 106 mg/dL — ABNORMAL HIGH (ref 65–99)
Glucose-Capillary: 138 mg/dL — ABNORMAL HIGH (ref 65–99)
Glucose-Capillary: 125 mg/dL — ABNORMAL HIGH (ref 65–99)

## 2016-05-25 LAB — MAGNESIUM: Magnesium: 1.1 mg/dL — ABNORMAL LOW (ref 1.7–2.4)

## 2016-05-25 LAB — FOLATE: Folate: 6.2 ng/mL (ref >5.9)

## 2016-05-25 MED ORDER — MAGNESIUM SULFATE 2 GM/50ML IV SOLN
2.0000 g | Freq: Once | INTRAVENOUS | Status: AC
  Administered 2016-05-25: 2 g via INTRAVENOUS
  Filled 2016-05-25: qty 50

## 2016-05-25 MED ORDER — POTASSIUM CHLORIDE CRYS ER 20 MEQ PO TBCR
40.0000 meq | EXTENDED_RELEASE_TABLET | ORAL | Status: AC
  Administered 2016-05-25 (×2): 40 meq via ORAL
  Filled 2016-05-25 (×2): qty 2

## 2016-05-25 MED ORDER — SODIUM CHLORIDE 0.9 % IV SOLN: Freq: Once | INTRAVENOUS | Status: DC

## 2016-05-25 MED ORDER — LIVING WELL WITH DIABETES BOOK
Freq: Once | Status: AC
  Administered 2016-05-25: 1
  Filled 2016-05-25: qty 1

## 2016-05-25 MED ORDER — LISINOPRIL 10 MG PO TABS
10.0000 mg | ORAL_TABLET | Freq: Every day | ORAL | Status: DC
  Administered 2016-05-25: 10 mg via ORAL
  Filled 2016-05-25: qty 1

## 2016-05-25 MED ORDER — SODIUM CHLORIDE 0.9 % IV BOLUS (SEPSIS)
500.0000 mL | Freq: Once | INTRAVENOUS | Status: AC
  Administered 2016-05-25: 500 mL via INTRAVENOUS

## 2016-05-25 MED ORDER — HYDRALAZINE HCL 20 MG/ML IJ SOLN
5.0000 mg | Freq: Once | INTRAMUSCULAR | Status: AC
  Administered 2016-05-26: 5 mg via INTRAVENOUS
  Filled 2016-05-25: qty 1

## 2016-05-25 NOTE — Progress Notes (Signed)
PROGRESS NOTE    Allison [name]  RUE:[id number] DOB: February 02, 1971 DOA: 05/22/2016 PCP: No primary care provider on file.   Brief Narrative: Allison [name] is a 45 y.o. female with a history of HTN and GERD who presents to the ED with complaints of dysuria and lower ABD and Back Pain x 2 weeks and fevers and chills and nausea and Vomiting for the past 24 hours. She was found to have a fever to 103.1 in the ED along with a positive UA and elevated BUN/Cr elevated Glucose level of 308 which is new for her.    Assessment & Plan:   Principal Problem:   Sepsis (HCC) Active Problems:   Acute pyelonephritis   Pyelonephritis   Hyperglycemia   Hypertension  Sepsis; Pyelonephritis. E coli Bacteremia;  Lactic acid on admission at 5 trending down at 2.7 WBC trending down from 25---20. Blood culture growing E. Coli.  Urine culture 10, ooo colonies.  IV fluids, and IV bolus.  CT renal stone protocol : No definite hydronephrosis or renal calculi are noted. Multiple calcifications are noted in the pelvis most consistent with phleboliths, but the possibility of distal ureteral calculi cannot be excluded. No significant ureteral dilatation is noted. Left perinephric stranding is noted consistent with the patient's history of pyelonephritis. However, also noted as 5.4 x 2.6 cm soft tissue prominence arising from the upper pole of left kidney ; the possibility of this representing mass or neoplasm cannot be excluded, and further evaluation with MRI or CT scan with contrast administration is recommended. 4 cm rounded hypodensity seen in left ovary most consistent withovarian cyst. Pelvic ultrasound is recommended for further evaluation.  MRI ordered to further evaluate CT findings, per mri report patient will need to have repeat imaging once infection completely treated.  AKI; Last Cr per records at 0.6. Cr on admission at 1.6.  Continue with IV fluids.  Suspect related to sepsis, Infection.    Improving.   Hypokalemia;  Replete orally. Check mag  Pyelonephritis: Continue with IV ceftriaxone.   Hyperglycemia:  Hb-A1c 8.2 No prior history of Diabetes, likely new diagnosis.  Diabetic teaching  HTN; continue home BP medication.   Microcytic Anemia; with iron deficient and inappropriately low retic counts, patient reported h/o heavy menses, Stool negative for occult bloodx1.   DVT prophylaxis: Lovenox Code Status: Full Code.  Family Communication: pateint Disposition Plan:  Home when sepsis resolved and cultures available.    Consultants:   none  Procedures:   none  Antimicrobials:   Ceftriaxone 5-28   Subjective: Feels better, fever trending down,  denies pain, denies h/o  anemia or diabetes.  Objective: Filed Vitals:   05/25/16 1130 05/25/16 1140 05/25/16 1415 05/25/16 1659  BP: 163/98 175/105 146/86 159/94  Pulse: 93 89 93 97  Temp: 98.7 F (37.1 C) 98.9 F (37.2 C) 99.7 F (37.6 C) 100.4 F (38 C)  TempSrc: Oral Oral Oral Oral  Resp: Height:      Weight:      SpO2: 96% 97% 90% 95%    Intake/Output Summary (Last 24 hours) at 05/25/16 2103 Last data filed at 05/25/16 1500  Gross per 24 hour  Intake   2225 ml  Output   1200 ml  Net   1025 ml   Filed Weights   05/22/16 1813 05/22/16 2211  Weight: 83.462 kg (184 lb) 84.3 kg (185 lb 13.6 oz)    Examination:  General exam: Appears calm and comfortable  Respiratory system: Clear to auscultation. Respiratory effort normal. Cardiovascular system: S1 & S2 heard, RRR. No JVD, murmurs, rubs, gallops or clicks. No pedal edema. Gastrointestinal system: Abdomen is nondistended, soft and nontender. No organomegaly or masses felt. Normal bowel sounds heard. Central nervous system: Alert and oriented. No focal neurological deficits. Extremities: Symmetric 5 x 5 power. Skin: No rashes, lesions or ulcers Psychiatry: Judgement and insight appear normal. Mood & affect appropriate.      Data Reviewed: I have personally reviewed following labs and imaging studies  CBC:  Recent Labs Lab 05/22/16 1751 05/23/16 0114 05/24/16 0344 05/25/16 0339 05/25/16 1503  WBC 25.6* 20.4* 12.5* 10.0 9.8  HGB 8.2* 8.0* 7.3* 6.7* 8.2*  HCT 27.5* 26.4* 23.8* 22.0* 26.8*  MCV 68.2* 68.0* 67.4* 66.3* 67.5*  PLT 324 281 287 272 316   Basic Metabolic Panel:  Recent Labs Lab 05/22/16 1751 05/23/16 0114 05/24/16 0344 05/25/16 0339 05/25/16 0837 05/25/16 1503  NA 131* 131* 130* 132*  --  133*  K 3.1* 3.3* 3.2* 2.9*  --  3.3*  CL 95* 99* 100* 101  --  102  CO2 24 24 21* 23  --  25  GLUCOSE 305* 175* 128* 108*  --  129*  BUN 16 17 18 10   --  7  CREATININE 1.65* 1.39* 1.20* 0.81  --  0.76  CALCIUM 8.0* 7.1* 6.9* 7.0*  --  7.4*  MG  --   --   --   --  1.1*  --    GFR: Estimated Creatinine Clearance: 93.3 mL/min (by C-G formula based on Cr of 0.76). Liver Function Tests:  Recent Labs Lab 05/25/16 0836  AST 98*  ALT 33  ALKPHOS 128*  BILITOT 0.8  PROT 6.8  ALBUMIN 2.4*   No results for input(s): LIPASE, AMYLASE in the last 168 hours. No results for input(s): AMMONIA in the last 168 hours. Coagulation Profile: No results for input(s): INR, PROTIME in the last 168 hours. Cardiac Enzymes: No results for input(s): CKTOTAL, CKMB, CKMBINDEX, TROPONINI in the last 168 hours. BNP (last 3 results) No results for input(s): PROBNP in the last 8760 hours. HbA1C: No results for input(s): HGBA1C in the last 72 hours. CBG:  Recent Labs Lab 05/24/16 1702 05/24/16 2123 05/25/16 0758 05/25/16 1208 05/25/16 1737  GLUCAP 104* 163* 106* 110* 138*   Lipid Profile: No results for input(s): CHOL, HDL, LDLCALC, TRIG, CHOLHDL, LDLDIRECT in the last 72 hours. Thyroid Function Tests: No results for input(s): TSH, T4TOTAL, FREET4, T3FREE, THYROIDAB in the last 72 hours. Anemia Panel:  Recent Labs  05/25/16 0339  VITAMINB12 795  FOLATE 6.2  FERRITIN 105  TIBC 309  IRON  9*  RETICCTPCT 0.7   Sepsis Labs:  Recent Labs Lab 05/22/16 2115 05/22/16 2226 05/23/16 0114 05/24/16 0344  PROCALCITON  --  10.63  --   --   LATICACIDVEN 2.65* 2.1* 2.7* 1.7    Recent Results (from the past 240 hour(s))  Blood Culture (routine x 2)     Status: Abnormal   Collection Time: 05/22/16  5:51 PM  Result Value Ref Range Status   Specimen Description BLOOD LEFT ANTECUBITAL  Final   Special Requests BOTTLES DRAWN AEROBIC AND ANAEROBIC 5CC  Final   Culture  Setup Time   Final    GRAM NEGATIVE RODS IN BOTH AEROBIC AND ANAEROBIC BOTTLES CRITICAL RESULT CALLED TO, READ BACK BY AND VERIFIED WITH: J LEGGE,PHARMD AT 1310 05/23/16 BY L BENFIELD Performed at [facility]  Culture ESCHERICHIA COLI (A)  Final   Report Status 05/25/2016 FINAL  Final   Organism ID, Bacteria ESCHERICHIA COLI  Final      Susceptibility   Escherichia coli - MIC*    AMPICILLIN <=2 SENSITIVE Sensitive     CEFAZOLIN <=4 SENSITIVE Sensitive     CEFEPIME <=1 SENSITIVE Sensitive     CEFTAZIDIME <=1 SENSITIVE Sensitive     CEFTRIAXONE <=1 SENSITIVE Sensitive     CIPROFLOXACIN <=0.25 SENSITIVE Sensitive     GENTAMICIN <=1 SENSITIVE Sensitive     IMIPENEM <=0.25 SENSITIVE Sensitive     TRIMETH/SULFA <=20 SENSITIVE Sensitive     AMPICILLIN/SULBACTAM <=2 SENSITIVE Sensitive     PIP/TAZO <=4 SENSITIVE Sensitive     * ESCHERICHIA COLI  Blood Culture ID Panel (Reflexed)     Status: Abnormal   Collection Time: 05/22/16  5:51 PM  Result Value Ref Range Status   Enterococcus species NOT DETECTED NOT DETECTED Final   Vancomycin resistance NOT DETECTED NOT DETECTED Final   Listeria monocytogenes NOT DETECTED NOT DETECTED Final   Staphylococcus species NOT DETECTED NOT DETECTED Final   Staphylococcus aureus NOT DETECTED NOT DETECTED Final   Methicillin resistance NOT DETECTED NOT DETECTED Final   Streptococcus species NOT DETECTED NOT DETECTED Final   Streptococcus agalactiae NOT DETECTED NOT  DETECTED Final   Streptococcus pneumoniae NOT DETECTED NOT DETECTED Final   Streptococcus pyogenes NOT DETECTED NOT DETECTED Final   Acinetobacter baumannii NOT DETECTED NOT DETECTED Final   Enterobacteriaceae species DETECTED (A) NOT DETECTED Final    Comment: CRITICAL RESULT CALLED TO, READ BACK BY AND VERIFIED WITH: J LEGGE,PHARMD AT 1310 05/23/16 BY L BENFIELD    Enterobacter cloacae complex NOT DETECTED NOT DETECTED Final   Escherichia coli DETECTED (A) NOT DETECTED Final    Comment: CRITICAL RESULT CALLED TO, READ BACK BY AND VERIFIED WITH: J LEGGE,PHARMD AT 1310 05/23/16 BY L BENFIELD    Klebsiella oxytoca NOT DETECTED NOT DETECTED Final   Klebsiella pneumoniae NOT DETECTED NOT DETECTED Final   Proteus species NOT DETECTED NOT DETECTED Final   Serratia marcescens NOT DETECTED NOT DETECTED Final   Carbapenem resistance NOT DETECTED NOT DETECTED Final   Haemophilus influenzae NOT DETECTED NOT DETECTED Final   Neisseria meningitidis NOT DETECTED NOT DETECTED Final   Pseudomonas aeruginosa NOT DETECTED NOT DETECTED Final   Candida albicans NOT DETECTED NOT DETECTED Final   Candida glabrata NOT DETECTED NOT DETECTED Final   Candida krusei NOT DETECTED NOT DETECTED Final   Candida parapsilosis NOT DETECTED NOT DETECTED Final   Candida tropicalis NOT DETECTED NOT DETECTED Final    Comment: Performed at [facility]  Blood Culture (routine x 2)     Status: Abnormal   Collection Time: 05/22/16  6:04 PM  Result Value Ref Range Status   Specimen Description BLOOD RIGHT ANTECUBITAL  Final   Special Requests BOTTLES DRAWN AEROBIC AND ANAEROBIC 5CC  Final   Culture  Setup Time   Final    GRAM NEGATIVE RODS IN BOTH AEROBIC AND ANAEROBIC BOTTLES CRITICAL RESULT CALLED TO, READ BACK BY AND VERIFIED WITH: J LEGGE,PHARMD AT 1310 05/23/16 BY L BENFIELD    Culture (A)  Final    ESCHERICHIA COLI SUSCEPTIBILITIES PERFORMED ON PREVIOUS CULTURE WITHIN THE LAST 5 DAYS. Performed at [facility]    Report Status 05/25/2016 FINAL  Final  Urine C&S     Status: Abnormal   Collection Time: 05/22/16  6:50 PM  Result Value Ref Range  Status   Specimen Description URINE, CLEAN CATCH  Final   Special Requests NONE  Final   Culture (A)  Final    <10,000 COLONIES/mL INSIGNIFICANT GROWTH Performed at [facility]    Report Status 05/24/2016 FINAL  Final  Culture, blood (routine x 2)     Status: None (Preliminary result)   Collection Time: 05/25/16  8:36 AM  Result Value Ref Range Status   Specimen Description BLOOD RIGHT ARM  Final   Special Requests BOTTLES DRAWN AEROBIC AND ANAEROBIC 10CC  Final   Culture   Final    NO GROWTH <12 HOURS Performed at [facility]    Report Status PENDING  Incomplete  Culture, blood (routine x 2)     Status: None (Preliminary result)   Collection Time: 05/25/16  8:37 AM  Result Value Ref Range Status   Specimen Description BLOOD RIGHT HAND  Final   Special Requests BOTTLES DRAWN AEROBIC ONLY 7CC  Final   Culture   Final    NO GROWTH <12 HOURS Performed at [facility]    Report Status PENDING  Incomplete         Radiology Studies: Mr Abdomen W Wo Contrast  05/24/2016  CLINICAL DATA:  Left kidney upper pole soft tissue prominence on noncontrast CT in this patient who has dysuria, hematuria, and possible pyelonephritis. EXAM: MRI ABDOMEN WITHOUT AND WITH CONTRAST TECHNIQUE: Multiplanar multisequence MR imaging of the abdomen was performed both before and after the administration of intravenous contrast. CONTRAST:  17mL MULTIHANCE GADOBENATE DIMEGLUMINE 529 MG/ML IV SOLN COMPARISON:  05/23/2016 FINDINGS: Lower chest:  Trace right pleural effusion. Hepatobiliary: Diffuse hepatic steatosis. Severe wall thickening of the gallbladder. Mild perihepatic ascites. No biliary dilatation identified. Pancreas: Low-level peripancreatic stranding, although this may be secondary. No significant abnormal pancreatic enhancement.  Spleen: Unremarkable Adrenals/Urinary Tract: Adrenal glands normal. Right kidney normal. Patchy hypoenhancement throughout the left kidney compatible with pyelonephritis. Lobular contour both kidneys due to fetal lobulation. This includes the upper pole region where there was some convexity on noncontrast CT. There certainly disrupted enhancement in this vicinity but not appreciably different from in the rest of the kidney, and I favor this all be due to infection rather than neoplasm. Stomach/Bowel: Unremarkable Vascular/Lymphatic: Small retroperitoneal lymph nodes are likely reactive. Other: Perihepatic ascites. Left greater than right perirenal stranding. There is inflammatory stranding in the retroperitoneum. A fluid signal intensity left ovarian cyst measuring up to 3.7 cm in long axis does not require further workup. Small amount of pelvic ascites. Musculoskeletal: Unremarkable IMPRESSION: 1. There is pyelonephritis on the left, and the swelling and hypoenhancement in the left kidney upper pole with contour convexity is highly likely to be due to pyelonephritis and fetal lobulation of the kidneys rather than a mass. There certainly can be some overlap in appearance between infection and mass but given the overall appearance, pyelonephritis is far and away the most likely cause for the upper pole convexity. If the patient has persistent hematuria despite effective treatment of the pyelonephritis, then repeat workup may be warranted when there is not severe active infection of the kidney confusing findings. 2. Renal and retroperitoneal stranding compatible with inflammation. Small reactive retroperitoneal lymph nodes. 3. There is prominent wall thickening in the gallbladder -cholecystitis is not excluded. 4. Mild perihepatic ascites. Trace right pleural effusion. There is also evidence of some pelvic ascites and a small left ovarian cyst. 5. Diffuse hepatic steatosis. Electronically Signed   By: Annitta Needs.D.  On: 05/24/2016 15:51        Scheduled Meds: . sodium chloride   Intravenous Once  . cefTRIAXone (ROCEPHIN)  IV  2 g Intravenous Q24H  . enoxaparin (LOVENOX) injection  40 mg Subcutaneous QHS  . insulin aspart  0-5 Units Subcutaneous QHS  . insulin aspart  0-9 Units Subcutaneous TID WC  . lisinopril  10 mg Oral Daily  . ondansetron  4 mg Intravenous Once   Continuous Infusions:     LOS: 2 days    Time spent: 35 minutes.     Albertine GratesXu,Tamer Baughman, MD PhD [facility] Pager [phone]  If 7PM-7AM, please contact night-coverage [email or web address] Password [email or web address] 05/25/2016, 9:03 PM

## 2016-05-25 NOTE — Psychosocial Assessment (Signed)
Patient ambulated twice in the hallway earlier this shift.

## 2016-05-25 NOTE — Progress Notes (Signed)
CRITICAL VALUE ALERT  Critical value received:  Hemoglobin =6.7  Date of notification:  05/25/2016  Time of notification:  0406  Critical value read back:yes/talked w/ACE in lab  Nurse who received alert:  Jamesetta OrleansWillie bernita Karie Skowron,rn  MD notified (1st page):  K,Schorr  Time of first page:  0407  MD notified (2nd page):K.Schorr  Time of second ZOXW:[id number]  Responding MD:  K.Schorr.NP  Time MD responded: [id number]

## 2016-05-25 NOTE — Progress Notes (Signed)
Inpatient Diabetes Program Recommendations  AACE/ADA: New Consensus Statement on Inpatient Glycemic Control (2015)  Target Ranges:  Prepandial:   less than 140 mg/dL      Peak postprandial:   less than 180 mg/dL (1-2 hours)      Critically ill patients:  140 - 180 mg/dL   Review of Glycemic Control  Diabetes history: None Outpatient Diabetes medications: N/A Current orders for Inpatient glycemic control: Novolog sensitive tidwc and hs  Results for Allison [name], Allison [name] (MRN [id number]) as of 05/25/2016 11:18  Ref. Range 05/22/2016 17:51  Hemoglobin A1C Latest Ref Range: 4.8-5.6 % 8.2 (H)  Results for Allison [name], Allison [name] (MRN [id number]) as of 05/25/2016 11:18  Ref. Range 05/25/2016 03:39  Sodium Latest Ref Range: 135-145 mmol/L 132 (L)  Potassium Latest Ref Range: 3.5-5.1 mmol/L 2.9 (L)  Chloride Latest Ref Range: 101-111 mmol/L 101  CO2 Latest Ref Range: 22-32 mmol/L 23  BUN Latest Ref Range: 6-20 mg/dL 10  Creatinine Latest Ref Range: 0.44-1.00 mg/dL 0.81  Calcium Latest Ref Range: 8.9-10.3 mg/dL 7.0 (L)  EGFR (Non-African Amer.) Latest Ref Range: >60 mL/min >60  EGFR (African American) Latest Ref Range: >60 mL/min >60  Glucose Latest Ref Range: 65-99 mg/dL 108 (H)  Anion gap Latest Ref Range: 5-15  8  Results for Allison [name], Allison [name] (MRN [id number]) as of 05/25/2016 11:18  Ref. Range 05/24/2016 08:01 05/24/2016 11:27 05/24/2016 17:02 05/24/2016 21:23 05/25/2016 07:58  Glucose-Capillary Latest Ref Range: 65-99 mg/dL 118 (H) 136 (H) 104 (H) 163 (H) 106 (H)  HgbA1C of 8.2% indicates diabetes diagnosis. Spoke with pt at length regarding new diagnosis. Pt states she has intentionally lost 30 pounds in the past year. Has family hx DM (mother). Explained what HgbA1C is, her results, and goals. Pt has PCP but has not seen them in awhile. To make appt with MD prior to discharge. Ordered Living Well book and encouraged pt to view diabetes videos on pt ed channel. Will order OP Diabetes Education consult when  diagnosis of DM entered. Blood sugars trending well. Will need prescription for meter, strips and lancets at discharge.  Inpatient Diabetes Program Recommendations:    Newly-diagnosed DM. - Recommend first-line therapy of diet, exercise and OHAs with f/u with PCP to manage DM. Pt appears very motivated to continue with weight loss. Have stressed importance of keeping blood sugars in good control to reduce risk of complications.  Will f/u on 6/1 for any further questions. Thank you. Lorenda Peck, RD, LDN, CDE Inpatient Diabetes Coordinator [phone]

## 2016-05-25 NOTE — Progress Notes (Signed)
Bolus ordered and given.Tylenol given

## 2016-05-25 NOTE — Progress Notes (Signed)
Nursing Note: Temp 101.0.A: paged on-call and made aware of temp and will give tylenol per orders.wbb

## 2016-05-26 ENCOUNTER — Inpatient Hospital Stay (HOSPITAL_COMMUNITY): Payer: Self-pay

## 2016-05-26 DIAGNOSIS — A4151 Sepsis due to Escherichia coli [E. coli]: Principal | ICD-10-CM

## 2016-05-26 DIAGNOSIS — N12 Tubulo-interstitial nephritis, not specified as acute or chronic: Secondary | ICD-10-CM

## 2016-05-26 LAB — GLUCOSE, CAPILLARY
GLUCOSE-CAPILLARY: 169 mg/dL — AB (ref 65–99)
GLUCOSE-CAPILLARY: 129 mg/dL — AB (ref 65–99)
Glucose-Capillary: 103 mg/dL — ABNORMAL HIGH (ref 65–99)
Glucose-Capillary: 139 mg/dL — ABNORMAL HIGH (ref 65–99)

## 2016-05-26 LAB — TYPE AND SCREEN
ABO/RH(D): O POS
Antibody Screen: NEGATIVE
Unit division: 0

## 2016-05-26 LAB — CBC
HCT: 25.7 % — ABNORMAL LOW (ref 36.0–46.0)
HEMOGLOBIN: 8.1 g/dL — AB (ref 12.0–15.0)
MCH: 21.4 pg — AB (ref 26.0–34.0)
MCHC: 31.5 g/dL (ref 30.0–36.0)
MCV: 68 fL — AB (ref 78.0–100.0)
PLATELETS: 344 10*3/uL (ref 150–400)
RBC: 3.78 MIL/uL — AB (ref 3.87–5.11)
RDW: 21.8 % — ABNORMAL HIGH (ref 11.5–15.5)
WBC: 11.1 10*3/uL — AB (ref 4.0–10.5)

## 2016-05-26 LAB — BASIC METABOLIC PANEL
ANION GAP: 5 (ref 5–15)
BUN: <5 mg/dL — ABNORMAL LOW (ref 6–20)
CHLORIDE: 103 mmol/L (ref 101–111)
CO2: 26 mmol/L (ref 22–32)
Calcium: 7.6 mg/dL — ABNORMAL LOW (ref 8.9–10.3)
Creatinine, Ser: 0.64 mg/dL (ref 0.44–1.00)
GFR calc Af Amer: >60 mL/min (ref >60)
GLUCOSE: 103 mg/dL — AB (ref 65–99)
POTASSIUM: 3.3 mmol/L — AB (ref 3.5–5.1)
SODIUM: 134 mmol/L — AB (ref 135–145)

## 2016-05-26 LAB — MAGNESIUM: MAGNESIUM: 1.5 mg/dL — AB (ref 1.7–2.4)

## 2016-05-26 MED ORDER — FUROSEMIDE 10 MG/ML IJ SOLN
40.0000 mg | Freq: Once | INTRAMUSCULAR | Status: AC
  Administered 2016-05-26: 40 mg via INTRAVENOUS
  Filled 2016-05-26: qty 4

## 2016-05-26 MED ORDER — CARVEDILOL 3.125 MG PO TABS
3.1250 mg | ORAL_TABLET | Freq: Two times a day (BID) | ORAL | Status: DC
  Administered 2016-05-26 – 2016-05-27 (×3): 3.125 mg via ORAL
  Filled 2016-05-26 (×4): qty 1

## 2016-05-26 MED ORDER — POLYETHYLENE GLYCOL 3350 17 G PO PACK
17.0000 g | PACK | Freq: Every day | ORAL | Status: DC
  Filled 2016-05-26 (×2): qty 1

## 2016-05-26 MED ORDER — HYDRALAZINE HCL 20 MG/ML IJ SOLN
10.0000 mg | Freq: Once | INTRAMUSCULAR | Status: AC
  Administered 2016-05-26: 10 mg via INTRAVENOUS
  Filled 2016-05-26: qty 1

## 2016-05-26 MED ORDER — CEFAZOLIN SODIUM-DEXTROSE 2-4 GM/100ML-% IV SOLN
2.0000 g | Freq: Three times a day (TID) | INTRAVENOUS | Status: DC
  Administered 2016-05-26 – 2016-05-27 (×3): 2 g via INTRAVENOUS
  Filled 2016-05-26 (×4): qty 100

## 2016-05-26 MED ORDER — LOSARTAN POTASSIUM 50 MG PO TABS
25.0000 mg | ORAL_TABLET | Freq: Every day | ORAL | Status: DC
  Administered 2016-05-26: 25 mg via ORAL
  Filled 2016-05-26: qty 1

## 2016-05-26 MED ORDER — LISINOPRIL 20 MG PO TABS
20.0000 mg | ORAL_TABLET | Freq: Every day | ORAL | Status: DC
  Filled 2016-05-26: qty 1

## 2016-05-26 MED ORDER — SENNOSIDES-DOCUSATE SODIUM 8.6-50 MG PO TABS
1.0000 | ORAL_TABLET | Freq: Two times a day (BID) | ORAL | Status: DC
  Administered 2016-05-26: 1 via ORAL
  Filled 2016-05-26 (×2): qty 1

## 2016-05-26 MED ORDER — ZOLPIDEM TARTRATE 5 MG PO TABS
5.0000 mg | ORAL_TABLET | Freq: Every evening | ORAL | Status: DC | PRN
  Administered 2016-05-26: 5 mg via ORAL
  Filled 2016-05-26: qty 1

## 2016-05-26 MED ORDER — HYDRALAZINE HCL 20 MG/ML IJ SOLN
5.0000 mg | Freq: Once | INTRAMUSCULAR | Status: AC
  Administered 2016-05-26: 5 mg via INTRAVENOUS
  Filled 2016-05-26: qty 1

## 2016-05-26 MED ORDER — POTASSIUM CHLORIDE CRYS ER 20 MEQ PO TBCR
40.0000 meq | EXTENDED_RELEASE_TABLET | Freq: Once | ORAL | Status: AC
  Administered 2016-05-26: 40 meq via ORAL
  Filled 2016-05-26: qty 2

## 2016-05-26 MED ORDER — ZOLPIDEM TARTRATE 5 MG PO TABS
5.0000 mg | ORAL_TABLET | Freq: Once | ORAL | Status: AC
  Administered 2016-05-26: 5 mg via ORAL
  Filled 2016-05-26: qty 1

## 2016-05-26 MED ORDER — MAGNESIUM SULFATE 2 GM/50ML IV SOLN
2.0000 g | Freq: Once | INTRAVENOUS | Status: AC
  Administered 2016-05-26: 2 g via INTRAVENOUS
  Filled 2016-05-26: qty 50

## 2016-05-26 NOTE — Progress Notes (Signed)
PROGRESS NOTE    Allison [name]  WJX:[id number] DOB: 09-May-1971 DOA: 05/22/2016 PCP: No primary care provider on file.   Brief Narrative: Allison [name] is a 45 y.o. female with a history of HTN and GERD who presents to the ED with complaints of dysuria and lower ABD and Back Pain x 2 weeks and fevers and chills and nausea and Vomiting for the past 24 hours. She was found to have a fever to 103.1 in the ED along with a positive UA and elevated BUN/Cr elevated Glucose level of 308 which is new for her.    Assessment & Plan:   Principal Problem:   Sepsis (HCC) Active Problems:   Acute pyelonephritis   Pyelonephritis   Hyperglycemia   Hypertension  Sepsis; Pyelonephritis. E coli Bacteremia; presented on admission with leukocytosis, fever, lactic acidosis,  CT renal stone protocol : No definite hydronephrosis or renal calculi are noted. Multiple calcifications are noted in the pelvis most consistent with phleboliths, but the possibility of distal ureteral calculi cannot be excluded. No significant ureteral dilatation is noted. Left perinephric stranding is noted consistent with the patient's history of pyelonephritis. However, also noted as 5.4 x 2.6 cm soft tissue prominence arising from the upper pole of left kidney ; the possibility of this representing mass or neoplasm cannot be excluded, and further evaluation with MRI or CT scan with contrast administration is recommended. 4 cm rounded hypodensity seen in left ovary most consistent withovarian cyst. Pelvic ultrasound is recommended for further evaluation.  MRI ordered to further evaluate CT findings, per mri report patient will need to have repeat imaging once infection completely treated. Better, repeat blood culture no growth , continue iv abx for now, likely able to transition to oral if fever free for 24hrs.  AKI; Last Cr per records at 0.6. Cr on admission at 1.6. Suspect related to sepsis, Infection.  s/p IV fluids. Cr  normalized.   Pyelonephritis: Continue with IV abx   Hyperglycemia:  Hb-A1c 8.2 No prior history of Diabetes, likely new diagnosis.  Diabetic teaching  HTN;  Was not on bp meds at home, reported h/o lisinopril allergy with cough, change to losartan and start low dose coreg, monitor. Lasix x1 due to concerning of fluids overload, monitor effect.  Microcytic Anemia; with iron deficient and inappropriately low retic counts, patient reported h/o heavy menses, Stool negative for occult bloodx1.   Hypokalemia/hypomagnesemia:  Replete   DVT prophylaxis: Lovenox Code Status: Full Code.  Family Communication: pateint Disposition Plan:  Home if fever free for 24hrs, need outpatient gyn follow up , also need repeat mri.    Consultants:   none  Procedures:   none  Antimicrobials:   Ceftriaxone 5-28 to 6/1  Cefazolin from 6/1   Subjective:  fever trending down,  But still mild fever tmax 100,4, bp elevated, reported cough, with h/o cough while taking lisinopril, denies pain, denies h/o  anemia or diabetes.  Objective: Filed Vitals:   05/25/16 1659 05/25/16 2126 05/26/16 0227 05/26/16 0552  BP: 159/94 181/94 175/102 186/98  Pulse: 97 88 96 102  Temp: 100.4 F (38 C) 99.8 F (37.7 C) 98.8 F (37.1 C) 100.4 F (38 C)  TempSrc: Oral Oral Oral Oral  Resp: 18 20 20 20   Height:      Weight:      SpO2: 95% 97% 100% 99%    Intake/Output Summary (Last 24 hours) at 05/26/16 0805 Last data filed at 05/26/16 0553  Gross per 24 hour  Intake    875 ml  Output   1700 ml  Net   -825 ml   Filed Weights   05/22/16 1813 05/22/16 2211  Weight: 83.462 kg (184 lb) 84.3 kg (185 lb 13.6 oz)    Examination:  General exam: Appears calm and comfortable  Respiratory system:  Respiratory effort normal. Mild rales on right side. Cardiovascular system: S1 & S2 heard, RRR. No JVD, murmurs, rubs, gallops or clicks. trace pedal edema. Gastrointestinal system: Abdomen is nondistended, soft  and nontender. No organomegaly or masses felt. Normal bowel sounds heard. Central nervous system: Alert and oriented. No focal neurological deficits. Extremities: Symmetric 5 x 5 power. Skin: No rashes, lesions or ulcers Psychiatry: Judgement and insight appear normal. Mood & affect appropriate.     Data Reviewed: I have personally reviewed following labs and imaging studies  CBC:  Recent Labs Lab 05/23/16 0114 05/24/16 0344 05/25/16 0339 05/25/16 1503 05/26/16 0343  WBC 20.4* 12.5* 10.0 9.8 11.1*  HGB 8.0* 7.3* 6.7* 8.2* 8.1*  HCT 26.4* 23.8* 22.0* 26.8* 25.7*  MCV 68.0* 67.4* 66.3* 67.5* 68.0*  PLT 281 287 272 316 344   Basic Metabolic Panel:  Recent Labs Lab 05/23/16 0114 05/24/16 0344 05/25/16 0339 05/25/16 0837 05/25/16 1503 05/26/16 0343  NA 131* 130* 132*  --  133* 134*  K 3.3* 3.2* 2.9*  --  3.3* 3.3*  CL 99* 100* 101  --  102 103  CO2 24 21* 23  --  25 26  GLUCOSE 175* 128* 108*  --  129* 103*  BUN 17 18 10   --  7 <5*  CREATININE 1.39* 1.20* 0.81  --  0.76 0.64  CALCIUM 7.1* 6.9* 7.0*  --  7.4* 7.6*  MG  --   --   --  1.1*  --  1.5*   GFR: Estimated Creatinine Clearance: 93.3 mL/min (by C-G formula based on Cr of 0.64). Liver Function Tests:  Recent Labs Lab 05/25/16 0836  AST 98*  ALT 33  ALKPHOS 128*  BILITOT 0.8  PROT 6.8  ALBUMIN 2.4*   No results for input(s): LIPASE, AMYLASE in the last 168 hours. No results for input(s): AMMONIA in the last 168 hours. Coagulation Profile: No results for input(s): INR, PROTIME in the last 168 hours. Cardiac Enzymes: No results for input(s): CKTOTAL, CKMB, CKMBINDEX, TROPONINI in the last 168 hours. BNP (last 3 results) No results for input(s): PROBNP in the last 8760 hours. HbA1C: No results for input(s): HGBA1C in the last 72 hours. CBG:  Recent Labs Lab 05/25/16 0758 05/25/16 1208 05/25/16 1737 05/25/16 2131 05/26/16 0747  GLUCAP 106* 110* 138* 125* 103*   Lipid Profile: No results for  input(s): CHOL, HDL, LDLCALC, TRIG, CHOLHDL, LDLDIRECT in the last 72 hours. Thyroid Function Tests: No results for input(s): TSH, T4TOTAL, FREET4, T3FREE, THYROIDAB in the last 72 hours. Anemia Panel:  Recent Labs  05/25/16 0339  VITAMINB12 795  FOLATE 6.2  FERRITIN 105  TIBC 309  IRON 9*  RETICCTPCT 0.7   Sepsis Labs:  Recent Labs Lab 05/22/16 2115 05/22/16 2226 05/23/16 0114 05/24/16 0344  PROCALCITON  --  10.63  --   --   LATICACIDVEN 2.65* 2.1* 2.7* 1.7    Recent Results (from the past 240 hour(s))  Blood Culture (routine x 2)     Status: Abnormal   Collection Time: 05/22/16  5:51 PM  Result Value Ref Range Status   Specimen Description BLOOD LEFT ANTECUBITAL  Final   Special Requests  BOTTLES DRAWN AEROBIC AND ANAEROBIC 5CC  Final   Culture  Setup Time   Final    GRAM NEGATIVE RODS IN BOTH AEROBIC AND ANAEROBIC BOTTLES CRITICAL RESULT CALLED TO, READ BACK BY AND VERIFIED WITH: J LEGGE,PHARMD AT 1310 05/23/16 BY L BENFIELD Performed [facility]    Culture ESCHERICHIA COLI (A)  Final   Report Status 05/25/2016 FINAL  Final   Organism ID, Bacteria ESCHERICHIA COLI  Final      Susceptibility   Escherichia coli - MIC*    AMPICILLIN <=2 SENSITIVE Sensitive     CEFAZOLIN <=4 SENSITIVE Sensitive     CEFEPIME <=1 SENSITIVE Sensitive     CEFTAZIDIME <=1 SENSITIVE Sensitive     CEFTRIAXONE <=1 SENSITIVE Sensitive     CIPROFLOXACIN <=0.25 SENSITIVE Sensitive     GENTAMICIN <=1 SENSITIVE Sensitive     IMIPENEM <=0.25 SENSITIVE Sensitive     TRIMETH/SULFA <=20 SENSITIVE Sensitive     AMPICILLIN/SULBACTAM <=2 SENSITIVE Sensitive     PIP/TAZO <=4 SENSITIVE Sensitive     * ESCHERICHIA COLI  Blood Culture ID Panel (Reflexed)     Status: Abnormal   Collection Time: 05/22/16  5:51 PM  Result Value Ref Range Status   Enterococcus species NOT DETECTED NOT DETECTED Final   Vancomycin resistance NOT DETECTED NOT DETECTED Final   Listeria monocytogenes NOT DETECTED  NOT DETECTED Final   Staphylococcus species NOT DETECTED NOT DETECTED Final   Staphylococcus aureus NOT DETECTED NOT DETECTED Final   Methicillin resistance NOT DETECTED NOT DETECTED Final   Streptococcus species NOT DETECTED NOT DETECTED Final   Streptococcus agalactiae NOT DETECTED NOT DETECTED Final   Streptococcus pneumoniae NOT DETECTED NOT DETECTED Final   Streptococcus pyogenes NOT DETECTED NOT DETECTED Final   Acinetobacter baumannii NOT DETECTED NOT DETECTED Final   Enterobacteriaceae species DETECTED (A) NOT DETECTED Final    Comment: CRITICAL RESULT CALLED TO, READ BACK BY AND VERIFIED WITH: J LEGGE,PHARMD AT 1310 05/23/16 BY L BENFIELD    Enterobacter cloacae complex NOT DETECTED NOT DETECTED Final   Escherichia coli DETECTED (A) NOT DETECTED Final    Comment: CRITICAL RESULT CALLED TO, READ BACK BY AND VERIFIED WITH: J LEGGE,PHARMD AT 1310 05/23/16 BY L BENFIELD    Klebsiella oxytoca NOT DETECTED NOT DETECTED Final   Klebsiella pneumoniae NOT DETECTED NOT DETECTED Final   Proteus species NOT DETECTED NOT DETECTED Final   Serratia marcescens NOT DETECTED NOT DETECTED Final   Carbapenem resistance NOT DETECTED NOT DETECTED Final   Haemophilus influenzae NOT DETECTED NOT DETECTED Final   Neisseria meningitidis NOT DETECTED NOT DETECTED Final   Pseudomonas aeruginosa NOT DETECTED NOT DETECTED Final   Candida albicans NOT DETECTED NOT DETECTED Final   Candida glabrata NOT DETECTED NOT DETECTED Final   Candida krusei NOT DETECTED NOT DETECTED Final   Candida parapsilosis NOT DETECTED NOT DETECTED Final   Candida tropicalis NOT DETECTED NOT DETECTED Final    Comment: Performed [facility]  Blood Culture (routine x 2)     Status: Abnormal   Collection Time: 05/22/16  6:04 PM  Result Value Ref Range Status   Specimen Description BLOOD RIGHT ANTECUBITAL  Final   Special Requests BOTTLES DRAWN AEROBIC AND ANAEROBIC 5CC  Final   Culture  Setup Time   Final    GRAM  NEGATIVE RODS IN BOTH AEROBIC AND ANAEROBIC BOTTLES CRITICAL RESULT CALLED TO, READ BACK BY AND VERIFIED WITH: J LEGGE,PHARMD AT 1310 05/23/16 BY L BENFIELD  Culture (A)  Final    ESCHERICHIA COLI SUSCEPTIBILITIES PERFORMED ON PREVIOUS CULTURE WITHIN THE LAST 5 DAYS. Performed at [facility]    Report Status 05/25/2016 FINAL  Final  Urine C&S     Status: Abnormal   Collection Time: 05/22/16  6:50 PM  Result Value Ref Range Status   Specimen Description URINE, CLEAN CATCH  Final   Special Requests NONE  Final   Culture (A)  Final    <10,000 COLONIES/mL INSIGNIFICANT GROWTH Performed at [facility]    Report Status 05/24/2016 FINAL  Final  Culture, blood (routine x 2)     Status: None (Preliminary result)   Collection Time: 05/25/16  8:36 AM  Result Value Ref Range Status   Specimen Description BLOOD RIGHT ARM  Final   Special Requests BOTTLES DRAWN AEROBIC AND ANAEROBIC 10CC  Final   Culture   Final    NO GROWTH <12 HOURS Performed at [facility]    Report Status PENDING  Incomplete  Culture, blood (routine x 2)     Status: None (Preliminary result)   Collection Time: 05/25/16  8:37 AM  Result Value Ref Range Status   Specimen Description BLOOD RIGHT HAND  Final   Special Requests BOTTLES DRAWN AEROBIC ONLY 7CC  Final   Culture   Final    NO GROWTH <12 HOURS Performed at [facility]    Report Status PENDING  Incomplete         Radiology Studies: Mr Abdomen W Wo Contrast  05/24/2016  CLINICAL DATA:  Left kidney upper pole soft tissue prominence on noncontrast CT in this patient who has dysuria, hematuria, and possible pyelonephritis. EXAM: MRI ABDOMEN WITHOUT AND WITH CONTRAST TECHNIQUE: Multiplanar multisequence MR imaging of the abdomen was performed both before and after the administration of intravenous contrast. CONTRAST:  17mL MULTIHANCE GADOBENATE DIMEGLUMINE 529 MG/ML IV SOLN COMPARISON:  05/23/2016 FINDINGS: Lower chest:  Trace  right pleural effusion. Hepatobiliary: Diffuse hepatic steatosis. Severe wall thickening of the gallbladder. Mild perihepatic ascites. No biliary dilatation identified. Pancreas: Low-level peripancreatic stranding, although this may be secondary. No significant abnormal pancreatic enhancement. Spleen: Unremarkable Adrenals/Urinary Tract: Adrenal glands normal. Right kidney normal. Patchy hypoenhancement throughout the left kidney compatible with pyelonephritis. Lobular contour both kidneys due to fetal lobulation. This includes the upper pole region where there was some convexity on noncontrast CT. There certainly disrupted enhancement in this vicinity but not appreciably different from in the rest of the kidney, and I favor this all be due to infection rather than neoplasm. Stomach/Bowel: Unremarkable Vascular/Lymphatic: Small retroperitoneal lymph nodes are likely reactive. Other: Perihepatic ascites. Left greater than right perirenal stranding. There is inflammatory stranding in the retroperitoneum. A fluid signal intensity left ovarian cyst measuring up to 3.7 cm in long axis does not require further workup. Small amount of pelvic ascites. Musculoskeletal: Unremarkable IMPRESSION: 1. There is pyelonephritis on the left, and the swelling and hypoenhancement in the left kidney upper pole with contour convexity is highly likely to be due to pyelonephritis and fetal lobulation of the kidneys rather than a mass. There certainly can be some overlap in appearance between infection and mass but given the overall appearance, pyelonephritis is far and away the most likely cause for the upper pole convexity. If the patient has persistent hematuria despite effective treatment of the pyelonephritis, then repeat workup may be warranted when there is not severe active infection of the kidney confusing findings. 2. Renal and retroperitoneal stranding compatible with  inflammation. Small reactive retroperitoneal lymph nodes. 3.  There is prominent wall thickening in the gallbladder -cholecystitis is not excluded. 4. Mild perihepatic ascites. Trace right pleural effusion. There is also evidence of some pelvic ascites and a small left ovarian cyst. 5. Diffuse hepatic steatosis. Electronically Signed   By: Gaylyn Rong M.D.   On: 05/24/2016 15:51        Scheduled Meds: . sodium chloride   Intravenous Once  . cefTRIAXone (ROCEPHIN)  IV  2 g Intravenous Q24H  . enoxaparin (LOVENOX) injection  40 mg Subcutaneous QHS  . insulin aspart  0-5 Units Subcutaneous QHS  . insulin aspart  0-9 Units Subcutaneous TID WC  . lisinopril  20 mg Oral Daily  . magnesium sulfate 1 - 4 g bolus IVPB  2 g Intravenous Once  . ondansetron  4 mg Intravenous Once  . polyethylene glycol  17 g Oral Daily  . potassium chloride  40 mEq Oral Once  . senna-docusate  1 tablet Oral BID   Continuous Infusions:     LOS: 3 days    Time spent: 35 minutes.     Albertine Grates, MD PhD [facility] Pager [phone]  If 7PM-7AM, please contact night-coverage [email or web address] Password [email or web address] 05/26/2016, 8:05 AM

## 2016-05-27 DIAGNOSIS — D509 Iron deficiency anemia, unspecified: Secondary | ICD-10-CM

## 2016-05-27 DIAGNOSIS — R7989 Other specified abnormal findings of blood chemistry: Secondary | ICD-10-CM

## 2016-05-27 LAB — HEPATIC FUNCTION PANEL
ALBUMIN: 2.5 g/dL — AB (ref 3.5–5.0)
ALK PHOS: 181 U/L — AB (ref 38–126)
ALT: 43 U/L (ref 14–54)
AST: 155 U/L — ABNORMAL HIGH (ref 15–41)
BILIRUBIN TOTAL: 0.8 mg/dL (ref 0.3–1.2)
Bilirubin, Direct: 0.4 mg/dL (ref 0.1–0.5)
Indirect Bilirubin: 0.4 mg/dL (ref 0.3–0.9)
TOTAL PROTEIN: 6.7 g/dL (ref 6.5–8.1)

## 2016-05-27 LAB — GLUCOSE, CAPILLARY: GLUCOSE-CAPILLARY: 119 mg/dL — AB (ref 65–99)

## 2016-05-27 LAB — BASIC METABOLIC PANEL
ANION GAP: 8 (ref 5–15)
CHLORIDE: 102 mmol/L (ref 101–111)
CO2: 27 mmol/L (ref 22–32)
Calcium: 8.2 mg/dL — ABNORMAL LOW (ref 8.9–10.3)
Creatinine, Ser: 0.61 mg/dL (ref 0.44–1.00)
Glucose, Bld: 118 mg/dL — ABNORMAL HIGH (ref 65–99)
POTASSIUM: 3.1 mmol/L — AB (ref 3.5–5.1)
SODIUM: 137 mmol/L (ref 135–145)

## 2016-05-27 LAB — CBC
HEMATOCRIT: 26.1 % — AB (ref 36.0–46.0)
Hemoglobin: 8.2 g/dL — ABNORMAL LOW (ref 12.0–15.0)
MCH: 21.4 pg — AB (ref 26.0–34.0)
MCHC: 31.4 g/dL (ref 30.0–36.0)
MCV: 68 fL — AB (ref 78.0–100.0)
PLATELETS: 423 10*3/uL — AB (ref 150–400)
RBC: 3.84 MIL/uL — ABNORMAL LOW (ref 3.87–5.11)
RDW: 22.2 % — ABNORMAL HIGH (ref 11.5–15.5)
WBC: 13.5 10*3/uL — ABNORMAL HIGH (ref 4.0–10.5)

## 2016-05-27 LAB — MAGNESIUM: MAGNESIUM: 1.4 mg/dL — AB (ref 1.7–2.4)

## 2016-05-27 LAB — TSH: TSH: 3.464 u[IU]/mL (ref 0.350–4.500)

## 2016-05-27 MED ORDER — BLOOD GLUCOSE MONITOR KIT: PACK | Status: DC

## 2016-05-27 MED ORDER — CARVEDILOL 6.25 MG PO TABS: 6.2500 mg | ORAL_TABLET | Freq: Two times a day (BID) | ORAL | Status: DC

## 2016-05-27 MED ORDER — LOSARTAN POTASSIUM 50 MG PO TABS
50.0000 mg | ORAL_TABLET | Freq: Every day | ORAL | Status: DC
  Administered 2016-05-27: 50 mg via ORAL
  Filled 2016-05-27: qty 1

## 2016-05-27 MED ORDER — MAGNESIUM SULFATE 2 GM/50ML IV SOLN
2.0000 g | Freq: Once | INTRAVENOUS | Status: AC
  Administered 2016-05-27: 2 g via INTRAVENOUS
  Filled 2016-05-27: qty 50

## 2016-05-27 MED ORDER — METFORMIN HCL 500 MG PO TABS: 500.0000 mg | ORAL_TABLET | Freq: Two times a day (BID) | ORAL | Status: DC

## 2016-05-27 MED ORDER — POTASSIUM CHLORIDE CRYS ER 20 MEQ PO TBCR
40.0000 meq | EXTENDED_RELEASE_TABLET | Freq: Once | ORAL | Status: AC
  Administered 2016-05-27: 40 meq via ORAL
  Filled 2016-05-27: qty 2

## 2016-05-27 MED ORDER — CEPHALEXIN 750 MG PO CAPS: 750.0000 mg | ORAL_CAPSULE | Freq: Three times a day (TID) | ORAL | Status: DC

## 2016-05-27 MED ORDER — CARVEDILOL 3.125 MG PO TABS
3.1250 mg | ORAL_TABLET | Freq: Once | ORAL | Status: AC
  Administered 2016-05-27: 3.125 mg via ORAL
  Filled 2016-05-27: qty 1

## 2016-05-27 MED ORDER — LOSARTAN POTASSIUM 50 MG PO TABS: 50.0000 mg | ORAL_TABLET | Freq: Every day | ORAL | Status: DC

## 2016-05-27 NOTE — Discharge Summary (Signed)
Discharge Summary  [name]:[id number] DOB: Apr 24, 1971  PCP: No primary care provider on file.  Admit date: 05/22/2016 Discharge date: 05/27/2016  Time spent: >51mns  Recommendations for Outpatient Follow-up:  1. F/u with PMD within a week  for hospital discharge follow up, repeat cbc/cmp at follow up, hepatitis panel pending at discharge, pmd to follow up on final result, pmd to repeat mri ab to f/o on left kidney upper pole hypoenhancement changes. Newly diagnosed htn/ dm2/anemia, pmd to continue titrate meds. 2. F/u with gyn for heavy menses, repeat pelvic uKoreain 6 -12 week to f/u on left ovarian cyst.  Discharge Diagnoses:  Active Hospital Problems   Diagnosis Date Noted  . Sepsis [place] 05/22/2016  . Acute pyelonephritis 05/22/2016  . Hyperglycemia 05/22/2016  . Hypertension 05/22/2016  . Pyelonephritis 05/22/2016    Resolved Hospital Problems   Diagnosis Date Noted Date Resolved  No resolved problems to display.    Discharge Condition: stable  Diet recommendation: heart healthy/carb modified  Filed Weights   05/22/16 1813 05/22/16 2211  Weight: 83.462 kg (184 lb) 84.3 kg (185 lb 13.6 oz)    History of present illness:  Allison GAVIDIAis a 45y.o. female with a history of HTN and GERD who presents to the ED with complaints of dysuria and lower ABD and Back Pain x 2 weeks and fevers and chills and nausea and Vomiting for the past 24 hours. She was found to have a fever to 103.1 in the ED along with a positive UA and elevated BUN/Cr elevated Glucose level of 308 which is new for her.   Hospital Course:  Principal Problem:   Sepsis ([place] Active Problems:   Acute pyelonephritis   Pyelonephritis   Hyperglycemia   Hypertension  Sepsis; Pyelonephritis. E coli Bacteremia; presented on admission with leukocytosis, fever, lactic acidosis,  CT renal stone protocol : No definite hydronephrosis or renal calculi are noted. Multiple calcifications are noted in  the pelvis most consistent with phleboliths, but the possibility of distal ureteral calculi cannot be excluded. No significant ureteral dilatation is noted. Left perinephric stranding is noted consistent with the patient's history of pyelonephritis. However, also noted as 5.4 x 2.6 cm soft tissue prominence arising from the upper pole of left kidney ; the possibility of this representing mass or neoplasm cannot be excluded, and further evaluation with MRI or CT scan with contrast administration is recommended.   MRI ordered to further evaluate CT findings, per mri report patient will need to have repeat imaging once infection completely treated. Better, repeat blood culture no growth , received iv abx in the hospital, discharged on oral abx for finish total of 14day abx treatment.  AKI; Last Cr per records at 0.6. Cr on admission at 1.6. Suspect related to sepsis, Infection.  s/p IV fluids. Cr normalized.   Pyelonephritis: treated with IV abx , discharged on oral abx, no pain at discharge.  Hypokalemia/hypomagnesemia:  Replete   Hyperglycemia:  Hb-A1c 8.2 No prior history of Diabetes, likely new diagnosis.  Diabetic teaching Discharged on metformin  HTN; Was not on bp meds at home, reported h/o lisinopril allergy with cough, change to losartan and start low dose coreg, monitor. Lasix x1 due to concerning of fluids overload.  bp meds adjusted at discharge, pmd to continue adjust meds.  Microcytic Anemia; with iron deficient and inappropriately low retic counts, patient reported h/o heavy menses, Stool negative for occult blood. Outpatient gyn follow up.  4 cm rounded hypodensity seen  in left ovary most consistent withovarian cyst.  Pelvic ultrasound:  1. Complex left ovarian cyst measures 3.8 cm, likely a hemorrhagic cyst. If the patient is premenopausal no further follow-up is needed. The patient is poorly postmenopausal, recommend short-interval follow up ultrasound in 6-12 weeks  is recommended, preferably during the week following the patient's normal menses. 2. Normal appearance of the uterus and right ovary.   lft elevation with fatty infiltrate in the liver on CT ab, hepatitis panel pending, patient does drink alcohol daily. pmd to continue monitor liver function.   Code Status: Full Code.  Family Communication: pateint Disposition Plan: Home   Consultants:   none  Procedures:   prbc transfusion x1 units on 5/31  Antimicrobials:   Ceftriaxone 5-28 to 6/1  Cefazolin from 6/1  Discharge Exam: BP 148/90 mmHg  Pulse 85  Temp(Src) 98.2 F (36.8 C) (Oral)  Resp 16  Ht '5\' 3"'  (1.6 m)  Wt 84.3 kg (185 lb 13.6 oz)  BMI 32.93 kg/m2  SpO2 95%  LMP 04/29/2016  General: aaox3 Cardiovascular: rrr Respiratory: CTABL  Discharge Instructions You were cared for by a hospitalist during your hospital stay. If you have any questions about your discharge medications or the care you received while you were in the hospital after you are discharged, you can call the unit and asked to speak with the hospitalist on call if the hospitalist that took care of you is not available. Once you are discharged, your primary care physician will handle any further medical issues. Please note that NO REFILLS for any discharge medications will be authorized once you are discharged, as it is imperative that you return to your primary care physician (or establish a relationship with a primary care physician if you do not have one) for your aftercare needs so that they can reassess your need for medications and monitor your lab values.      Discharge Instructions    Diet - low sodium heart healthy    Complete by:  As directed   Carb modified     For home use only DME Glucometer    Complete by:  As directed      Increase activity slowly    Complete by:  As directed             Medication List    TAKE these medications        blood glucose meter kit and supplies Kit    Dispense based on patient and insurance preference. Use up to four times daily as directed. (FOR ICD-9 250.00, 250.01).     carvedilol 6.25 MG tablet  Commonly known as:  COREG  Take 1 tablet (6.25 mg total) by mouth 2 (two) times daily with a meal.     cephALEXin 750 MG capsule  Commonly known as:  KEFLEX  Take 1 capsule (750 mg total) by mouth 3 (three) times daily.     cetirizine 10 MG tablet  Commonly known as:  ZYRTEC  Take 10 mg by mouth daily as needed for allergies.     losartan 50 MG tablet  Commonly known as:  COZAAR  Take 1 tablet (50 mg total) by mouth daily.     metFORMIN 500 MG tablet  Commonly known as:  GLUCOPHAGE  Take 1 tablet (500 mg total) by mouth 2 (two) times daily with a meal.       Allergies  Allergen Reactions  . Lisinopril Cough   Follow-up Information    Follow up  with HARPER,CHARLES A, MD In 1 month.   Specialty:  Obstetrics and Gynecology   Why:  heavy menses, left ovarina cyst, repeat pelvic US in 6-12 wks   Contact information:   [address] [place] [address] [phone]       Follow up with Philis Fendt, MD In 1 week.   Specialty:  Internal Medicine   Why:  hospital discharge follow up, repeat cbc/cmp, f/u on liver function, repeat mri abdomen in a month to f/u on hypoenhancement in the left kidney upper pole    Contact information:   [place] [address] [phone]        The results of significant diagnostics from this hospitalization (including imaging, microbiology, ancillary and laboratory) are listed below for reference.    Significant Diagnostic Studies: Dg Chest 2 View  05/26/2016  CLINICAL DATA:  45 year old female with fever and cough. Initial encounter. EXAM: CHEST  2 VIEW COMPARISON:  02/05/2014 and earlier. FINDINGS: Cardiac silhouette is mildly increased. Other mediastinal contours are within normal limits. Visualized tracheal air column is within normal limits. There is  patchy increased opacity at the right lung base on the frontal view, not well correlated on the lateral. No definite pleural effusion. There is also mild peribronchial increased opacity at the left lung base. No pneumothorax. No acute osseous abnormality identified. IMPRESSION: Right greater than left peribronchial and more confluent right lung base opacity compatible in this setting with acute bilateral bronchopneumonia (consider viral/atypical etiologies). No definite pleural effusion. Electronically Signed   By: Genevie Ann M.D.   On: 05/26/2016 09:43   Mr Abdomen W Wo Contrast  05/24/2016  CLINICAL DATA:  Left kidney upper pole soft tissue prominence on noncontrast CT in this patient who has dysuria, hematuria, and possible pyelonephritis. EXAM: MRI ABDOMEN WITHOUT AND WITH CONTRAST TECHNIQUE: Multiplanar multisequence MR imaging of the abdomen was performed both before and after the administration of intravenous contrast. CONTRAST:  5m MULTIHANCE GADOBENATE DIMEGLUMINE 529 MG/ML IV SOLN COMPARISON:  05/23/2016 FINDINGS: Lower chest:  Trace right pleural effusion. Hepatobiliary: Diffuse hepatic steatosis. Severe wall thickening of the gallbladder. Mild perihepatic ascites. No biliary dilatation identified. Pancreas: Low-level peripancreatic stranding, although this may be secondary. No significant abnormal pancreatic enhancement. Spleen: Unremarkable Adrenals/Urinary Tract: Adrenal glands normal. Right kidney normal. Patchy hypoenhancement throughout the left kidney compatible with pyelonephritis. Lobular contour both kidneys due to fetal lobulation. This includes the upper pole region where there was some convexity on noncontrast CT. There certainly disrupted enhancement in this vicinity but not appreciably different from in the rest of the kidney, and I favor this all be due to infection rather than neoplasm. Stomach/Bowel: Unremarkable Vascular/Lymphatic: Small retroperitoneal lymph nodes are likely  reactive. Other: Perihepatic ascites. Left greater than right perirenal stranding. There is inflammatory stranding in the retroperitoneum. A fluid signal intensity left ovarian cyst measuring up to 3.7 cm in long axis does not require further workup. Small amount of pelvic ascites. Musculoskeletal: Unremarkable IMPRESSION: 1. There is pyelonephritis on the left, and the swelling and hypoenhancement in the left kidney upper pole with contour convexity is highly likely to be due to pyelonephritis and fetal lobulation of the kidneys rather than a mass. There certainly can be some overlap in appearance between infection and mass but given the overall appearance, pyelonephritis is far and away the most likely cause for the upper pole convexity. If the patient has persistent hematuria despite effective treatment of the pyelonephritis, then repeat workup  may be warranted when there is not severe active infection of the kidney confusing findings. 2. Renal and retroperitoneal stranding compatible with inflammation. Small reactive retroperitoneal lymph nodes. 3. There is prominent wall thickening in the gallbladder -cholecystitis is not excluded. 4. Mild perihepatic ascites. Trace right pleural effusion. There is also evidence of some pelvic ascites and a small left ovarian cyst. 5. Diffuse hepatic steatosis. Electronically Signed   By: Van Clines M.D.   On: 05/24/2016 15:51   US Pelvis Complete  05/26/2016  CLINICAL DATA:  Left ovarian cyst seen on CT. EXAM: TRANSABDOMINAL ULTRASOUND OF PELVIS TECHNIQUE: Transabdominal ultrasound examination of the pelvis was performed including evaluation of the uterus, ovaries, adnexal regions, and pelvic cul-de-sac. COMPARISON:  CT 05/23/2016 MRI abdomen 05/24/2016 FINDINGS: Uterus Measurements: 8.7 x 5.1 x 5.8 cm. No fibroids or other mass visualized. Nabothian cysts in the cervix. Endometrium Thickness: 5.8 mm.  No focal abnormality visualized. Right ovary Measurements: 2.7 x  1.7 x 2.4 cm. Normal appearance/no adnexal mass. Ovarian blood flow seen. Left ovary Measurements: 4.7 x 4.1 x 4.7 cm. There is a 3.8 x 2.1 x 3.6 cm complex cyst in the left ovary with lacy internal echoes. Blood flow seen to the left ovary, no definite internal flow to the complex cyst. Other findings: Free-fluid on MRI is not visualized sonographically. IMPRESSION: 1. Complex left ovarian cyst measures 3.8 cm, likely a hemorrhagic cyst. If the patient is premenopausal no further follow-up is needed. The patient is poorly postmenopausal, recommend short-interval follow up ultrasound in 6-12 weeks is recommended, preferably during the week following the patient's normal menses. 2. Normal appearance of the uterus and right ovary. Electronically Signed   By: Jeb Levering M.D.   On: 05/26/2016 22:03   Ct Renal Stone Study  05/23/2016  CLINICAL DATA:  Dysuria, gross hematuria. EXAM: CT ABDOMEN AND PELVIS WITHOUT CONTRAST TECHNIQUE: Multidetector CT imaging of the abdomen and pelvis was performed following the standard protocol without IV contrast. COMPARISON:  None. FINDINGS: Visualized lung bases are unremarkable. No significant osseous abnormality is noted. Fatty infiltration of the liver is noted. The spleen and pancreas appear normal. No gallstones are noted. Adrenal glands appear normal. Left perinephric stranding is noted. Possible mass measuring 5.4 x 2.6 cm is seen involving the superior pole of the left kidney. No definite hydronephrosis is noted on either side, there is no definite evidence renal calculi. Multiple calcifications are noted in pelvis most consistent with phleboliths, but the possibility of 1 of these representing ureteral calculi cannot be excluded as the ureters cannot be definitively identified in the distal portions. Urinary bladder is lead minimally distended. The appendix appears normal. There is no evidence of bowel obstruction. No abnormal fluid collection is noted. Small fat  containing periumbilical hernia is noted. Uterus and right ovary appear normal. 4 cm left ovarian cyst is noted. Mildly enlarged left retroperitoneal lymph nodes are noted with the largest measuring 13 x 8 mm. These most likely are inflammatory in etiology. IMPRESSION: Fatty infiltration of the liver. No definite hydronephrosis or renal calculi are noted. Multiple calcifications are noted in the pelvis most consistent with phleboliths, but the possibility of distal ureteral calculi cannot be excluded. No significant ureteral dilatation is noted. Small fat containing periumbilical hernia. 4 cm rounded hypodensity seen in left ovary most consistent with ovarian cyst. Pelvic ultrasound is recommended for further evaluation. Left perinephric stranding is noted consistent with the patient's history of pyelonephritis. However, also noted as 5.4 x 2.6 cm soft  tissue prominence arising from the upper pole of left kidney ; the possibility of this representing mass or neoplasm cannot be excluded, and further evaluation with MRI or CT scan with contrast administration is recommended. Electronically Signed   By: Marijo Conception, M.D.   On: 05/23/2016 14:23    Microbiology: Recent Results (from the past 240 hour(s))  Blood Culture (routine x 2)     Status: Abnormal   Collection Time: 05/22/16  5:51 PM  Result Value Ref Range Status   Specimen Description BLOOD LEFT ANTECUBITAL  Final   Special Requests BOTTLES DRAWN AEROBIC AND ANAEROBIC 5CC  Final   Culture  Setup Time   Final    GRAM NEGATIVE RODS IN BOTH AEROBIC AND ANAEROBIC BOTTLES CRITICAL RESULT CALLED TO, READ BACK BY AND VERIFIED WITH: J LEGGE,PHARMD AT 1310 05/23/16 BY L BENFIELD Performed at [place] [facility] (A)  Final   Report Status 05/25/2016 FINAL  Final   Organism ID, Bacteria ESCHERICHIA COLI  Final      Susceptibility   Escherichia coli - MIC*    AMPICILLIN <=2 SENSITIVE Sensitive     CEFAZOLIN <=4 SENSITIVE  Sensitive     CEFEPIME <=1 SENSITIVE Sensitive     CEFTAZIDIME <=1 SENSITIVE Sensitive     CEFTRIAXONE <=1 SENSITIVE Sensitive     CIPROFLOXACIN <=0.25 SENSITIVE Sensitive     GENTAMICIN <=1 SENSITIVE Sensitive     IMIPENEM <=0.25 SENSITIVE Sensitive     TRIMETH/SULFA <=20 SENSITIVE Sensitive     AMPICILLIN/SULBACTAM <=2 SENSITIVE Sensitive     PIP/TAZO <=4 SENSITIVE Sensitive     * ESCHERICHIA COLI  Blood Culture ID Panel (Reflexed)     Status: Abnormal   Collection Time: 05/22/16  5:51 PM  Result Value Ref Range Status   Enterococcus species NOT DETECTED NOT DETECTED Final   Vancomycin resistance NOT DETECTED NOT DETECTED Final   Listeria monocytogenes NOT DETECTED NOT DETECTED Final   Staphylococcus species NOT DETECTED NOT DETECTED Final   Staphylococcus aureus NOT DETECTED NOT DETECTED Final   Methicillin resistance NOT DETECTED NOT DETECTED Final   Streptococcus species NOT DETECTED NOT DETECTED Final   Streptococcus agalactiae NOT DETECTED NOT DETECTED Final   Streptococcus pneumoniae NOT DETECTED NOT DETECTED Final   Streptococcus pyogenes NOT DETECTED NOT DETECTED Final   Acinetobacter baumannii NOT DETECTED NOT DETECTED Final   Enterobacteriaceae species DETECTED (A) NOT DETECTED Final    Comment: CRITICAL RESULT CALLED TO, READ BACK BY AND VERIFIED WITH: J LEGGE,PHARMD AT 1310 05/23/16 BY L BENFIELD    Enterobacter cloacae complex NOT DETECTED NOT DETECTED Final   Escherichia coli DETECTED (A) NOT DETECTED Final    Comment: CRITICAL RESULT CALLED TO, READ BACK BY AND VERIFIED WITH: J LEGGE,PHARMD AT 1310 05/23/16 BY L BENFIELD    Klebsiella oxytoca NOT DETECTED NOT DETECTED Final   Klebsiella pneumoniae NOT DETECTED NOT DETECTED Final   Proteus species NOT DETECTED NOT DETECTED Final   Serratia marcescens NOT DETECTED NOT DETECTED Final   Carbapenem resistance NOT DETECTED NOT DETECTED Final   Haemophilus influenzae NOT DETECTED NOT DETECTED Final   Neisseria  meningitidis NOT DETECTED NOT DETECTED Final   Pseudomonas aeruginosa NOT DETECTED NOT DETECTED Final   Candida albicans NOT DETECTED NOT DETECTED Final   Candida glabrata NOT DETECTED NOT DETECTED Final   Candida krusei NOT DETECTED NOT DETECTED Final   Candida parapsilosis NOT DETECTED NOT DETECTED Final   Candida tropicalis NOT DETECTED NOT  DETECTED Final    Comment: Performed at [facility]  Blood Culture (routine x 2)     Status: Abnormal   Collection Time: 05/22/16  6:04 PM  Result Value Ref Range Status   Specimen Description BLOOD RIGHT ANTECUBITAL  Final   Special Requests BOTTLES DRAWN AEROBIC AND ANAEROBIC 5CC  Final   Culture  Setup Time   Final    GRAM NEGATIVE RODS IN BOTH AEROBIC AND ANAEROBIC BOTTLES CRITICAL RESULT CALLED TO, READ BACK BY AND VERIFIED WITH: J LEGGE,PHARMD AT 1310 05/23/16 BY L BENFIELD    Culture (A)  Final    ESCHERICHIA COLI SUSCEPTIBILITIES PERFORMED ON PREVIOUS CULTURE WITHIN THE LAST 5 DAYS. Performed at [facility]    Report Status 05/25/2016 FINAL  Final  Urine C&S     Status: Abnormal   Collection Time: 05/22/16  6:50 PM  Result Value Ref Range Status   Specimen Description URINE, CLEAN CATCH  Final   Special Requests NONE  Final   Culture (A)  Final    <10,000 COLONIES/mL INSIGNIFICANT GROWTH Performed at [facility]    Report Status 05/24/2016 FINAL  Final  Culture, blood (routine x 2)     Status: None (Preliminary result)   Collection Time: 05/25/16  8:36 AM  Result Value Ref Range Status   Specimen Description BLOOD RIGHT ARM  Final   Special Requests BOTTLES DRAWN AEROBIC AND ANAEROBIC 10CC  Final   Culture   Final    NO GROWTH 2 DAYS Performed at [facility]    Report Status PENDING  Incomplete  Culture, blood (routine x 2)     Status: None (Preliminary result)   Collection Time: 05/25/16  8:37 AM  Result Value Ref Range Status   Specimen Description BLOOD RIGHT HAND  Final   Special  Requests BOTTLES DRAWN AEROBIC ONLY [place]  Final   Culture   Final    NO GROWTH 2 DAYS Performed at [facility]    Report Status PENDING  Incomplete     Labs: Basic Metabolic Panel:  Recent Labs Lab 05/24/16 0344 05/25/16 0339 05/25/16 0837 05/25/16 1503 05/26/16 0343 05/27/16 0338  NA 130* 132*  --  133* 134* 137  K 3.2* 2.9*  --  3.3* 3.3* 3.1*  CL 100* 101  --  102 103 102  CO2 21* 23  --  '25 26 27  ' GLUCOSE 128* 108*  --  129* 103* 118*  BUN 18 10  --  7 <5* <5*  CREATININE 1.20* 0.81  --  0.76 0.64 0.61  CALCIUM 6.9* 7.0*  --  7.4* 7.6* 8.2*  MG  --   --  1.1*  --  1.5* 1.4*   Liver Function Tests:  Recent Labs Lab 05/25/16 0836 05/27/16 0338  AST 98* 155*  ALT 33 43  ALKPHOS 128* 181*  BILITOT 0.8 0.8  PROT 6.8 6.7  ALBUMIN 2.4* 2.5*   No results for input(s): LIPASE, AMYLASE in the last 168 hours. No results for input(s): AMMONIA in the last 168 hours. CBC:  Recent Labs Lab 05/24/16 0344 05/25/16 0339 05/25/16 1503 05/26/16 0343 05/27/16 0338  WBC 12.5* 10.0 9.8 11.1* 13.5*  HGB 7.3* 6.7* 8.2* 8.1* 8.2*  HCT 23.8* 22.0* 26.8* 25.7* 26.1*  MCV 67.4* 66.3* 67.5* 68.0* 68.0*  PLT 287 272 316 344 423*   Cardiac Enzymes: No results for input(s): CKTOTAL, CKMB, CKMBINDEX, TROPONINI in the last 168 hours. BNP: BNP (last 3 results) No  results for input(s): BNP in the last 8760 hours.  ProBNP (last 3 results) No results for input(s): PROBNP in the last 8760 hours.  CBG:  Recent Labs Lab 05/26/16 0747 05/26/16 1218 05/26/16 1701 05/26/16 2125 05/27/16 0737  GLUCAP 103* 169* 129* 139* 119*       Signed:  Alvie Speltz MD, PhD  [facility] 05/27/2016, 7:43 PM

## 2016-05-27 NOTE — Progress Notes (Signed)
Patient d/c home,stable. 

## 2016-05-27 NOTE — Progress Notes (Signed)
Prescription and d/c instructions given to the patient, verbalized understanding, teach back utilized.

## 2016-05-28 LAB — HEPATITIS PANEL, ACUTE
HEP A IGM: NEGATIVE
HEP B C IGM: NEGATIVE
Hepatitis B Surface Ag: NEGATIVE

## 2016-05-30 LAB — CULTURE, BLOOD (ROUTINE X 2)
CULTURE: NO GROWTH
CULTURE: NO GROWTH

## 2017-08-19 ENCOUNTER — Emergency Department (HOSPITAL_COMMUNITY)
Admission: EM | Admit: 2017-08-19 | Discharge: 2017-08-19 | Disposition: A | Discharge status: Home / Self Care | Payer: Medicaid Other | Attending: Emergency Medicine | Admitting: Emergency Medicine

## 2017-08-19 ENCOUNTER — Encounter (HOSPITAL_COMMUNITY): Payer: Self-pay | Admitting: Emergency Medicine

## 2017-08-19 ENCOUNTER — Emergency Department (HOSPITAL_COMMUNITY): Payer: Medicaid Other

## 2017-08-19 ENCOUNTER — Other Ambulatory Visit: Payer: Self-pay

## 2017-08-19 DIAGNOSIS — D649 Anemia, unspecified: Secondary | ICD-10-CM | POA: Diagnosis present

## 2017-08-19 DIAGNOSIS — R112 Nausea with vomiting, unspecified: Secondary | ICD-10-CM

## 2017-08-19 DIAGNOSIS — F10230 Alcohol dependence with withdrawal, uncomplicated: Secondary | ICD-10-CM | POA: Insufficient documentation

## 2017-08-19 DIAGNOSIS — R197 Diarrhea, unspecified: Secondary | ICD-10-CM

## 2017-08-19 DIAGNOSIS — R195 Other fecal abnormalities: Secondary | ICD-10-CM

## 2017-08-19 DIAGNOSIS — F10930 Alcohol use, unspecified with withdrawal, uncomplicated: Secondary | ICD-10-CM

## 2017-08-19 LAB — COMPREHENSIVE METABOLIC PANEL
ALT: 41 U/L (ref 14–54)
AST: 137 U/L — ABNORMAL HIGH (ref 15–41)
Albumin: 3.6 g/dL (ref 3.5–5.0)
Alkaline Phosphatase: 144 U/L — ABNORMAL HIGH (ref 38–126)
Anion gap: 14 (ref 5–15)
BUN: 7 mg/dL (ref 6–20)
CHLORIDE: 96 mmol/L — AB (ref 101–111)
CO2: 29 mmol/L (ref 22–32)
CREATININE: 0.8 mg/dL (ref 0.44–1.00)
Calcium: 9.1 mg/dL (ref 8.9–10.3)
Glucose, Bld: 116 mg/dL — ABNORMAL HIGH (ref 65–99)
Potassium: 3.2 mmol/L — ABNORMAL LOW (ref 3.5–5.1)
Sodium: 139 mmol/L (ref 135–145)
TOTAL PROTEIN: 9.4 g/dL — AB (ref 6.5–8.1)
Total Bilirubin: 1.1 mg/dL (ref 0.3–1.2)

## 2017-08-19 LAB — LIPASE, BLOOD: LIPASE: 43 U/L (ref 11–51)

## 2017-08-19 LAB — URINALYSIS, ROUTINE W REFLEX MICROSCOPIC
Bacteria, UA: NONE SEEN
GLUCOSE, UA: 50 mg/dL — AB
Ketones, ur: 5 mg/dL — AB
NITRITE: POSITIVE — AB
PH: 7 (ref 5.0–8.0)
Protein, ur: 100 mg/dL — AB
SPECIFIC GRAVITY, URINE: 1.015 (ref 1.005–1.030)

## 2017-08-19 LAB — I-STAT BETA HCG BLOOD, ED (NOT ORDERABLE)

## 2017-08-19 LAB — CBC
HCT: 26.6 % — ABNORMAL LOW (ref 36.0–46.0)
Hemoglobin: 7.6 g/dL — ABNORMAL LOW (ref 12.0–15.0)
MCH: 17.7 pg — ABNORMAL LOW (ref 26.0–34.0)
MCHC: 28.6 g/dL — ABNORMAL LOW (ref 30.0–36.0)
MCV: 61.9 fL — AB (ref 78.0–100.0)
Platelets: 256 10*3/uL (ref 150–400)
RBC: 4.3 MIL/uL (ref 3.87–5.11)
RDW: 22.3 % — ABNORMAL HIGH (ref 11.5–15.5)
WBC: 10.7 10*3/uL — AB (ref 4.0–10.5)

## 2017-08-19 LAB — POCT I-STAT TROPONIN I: Troponin i, poc: 0.01 ng/mL (ref 0.00–0.08)

## 2017-08-19 LAB — OCCULT BLOOD, POC DEVICE: Fecal Occult Bld: POSITIVE — AB

## 2017-08-19 MED ORDER — LORAZEPAM 2 MG/ML IJ SOLN
0.0000 mg | Freq: Four times a day (QID) | INTRAMUSCULAR | Status: DC
Start: 2017-08-19 — End: 2017-08-20
  Administered 2017-08-19: 2 mg via INTRAVENOUS
  Filled 2017-08-19: qty 1

## 2017-08-19 MED ORDER — LORAZEPAM 1 MG PO TABS: 0.0000 mg | ORAL_TABLET | Freq: Four times a day (QID) | ORAL | Status: DC

## 2017-08-19 MED ORDER — SODIUM CHLORIDE 0.9 % IV BOLUS (SEPSIS)
1000.0000 mL | Freq: Once | INTRAVENOUS | Status: AC
  Administered 2017-08-19: 1000 mL via INTRAVENOUS

## 2017-08-19 MED ORDER — VITAMIN B-1 100 MG PO TABS: 100.0000 mg | ORAL_TABLET | Freq: Every day | ORAL | Status: DC

## 2017-08-19 MED ORDER — THIAMINE HCL 100 MG/ML IJ SOLN
100.0000 mg | Freq: Every day | INTRAMUSCULAR | Status: DC
  Administered 2017-08-19: 100 mg via INTRAVENOUS
  Filled 2017-08-19: qty 2

## 2017-08-19 MED ORDER — CEPHALEXIN 500 MG PO CAPS: 500.0000 mg | ORAL_CAPSULE | Freq: Two times a day (BID) | ORAL | 0 refills | Status: AC

## 2017-08-19 MED ORDER — LORAZEPAM 2 MG/ML IJ SOLN: 0.0000 mg | Freq: Two times a day (BID) | INTRAMUSCULAR | Status: DC

## 2017-08-19 MED ORDER — ONDANSETRON HCL 4 MG/2ML IJ SOLN
4.0000 mg | Freq: Once | INTRAMUSCULAR | Status: AC
  Administered 2017-08-19: 4 mg via INTRAVENOUS
  Filled 2017-08-19: qty 2

## 2017-08-19 MED ORDER — LORAZEPAM 1 MG PO TABS
0.0000 mg | ORAL_TABLET | Freq: Two times a day (BID) | ORAL | Status: DC
Start: 2017-08-22 — End: 2017-08-20

## 2017-08-19 MED ORDER — ONDANSETRON HCL 4 MG PO TABS: 4.0000 mg | ORAL_TABLET | Freq: Four times a day (QID) | ORAL | 0 refills | Status: DC

## 2017-08-19 MED ORDER — CHLORDIAZEPOXIDE HCL 25 MG PO CAPS: ORAL_CAPSULE | ORAL | 0 refills | Status: DC

## 2017-08-19 NOTE — Discharge Instructions (Signed)
Please do not drink alcohol or drive while taking librium.    You may have diarrhea from the antibiotics.  It is very important that you continue to take the antibiotics even if you get diarrhea unless a medical professional tells you that you may stop taking them.  If you stop too early the bacteria you are being treated for will become stronger and you may need different, more powerful antibiotics that have more side effects and worsening diarrhea.  Please stay well hydrated and consider probiotics as they may decrease the severity of your diarrhea.  Please be aware that if you take any hormonal contraception (birth control pills, nexplanon, the ring, etc) that your birth control will not work while you are taking antibiotics and you need to use back up protection as directed on the birth control medication information insert.   Today you received medications that may make you sleepy or impair your ability to make decisions.  For the next 24 hours please do not drive, operate heavy machinery, care for a small child with out another adult present, or perform any activities that may cause harm to you or someone else if you were to fall asleep or be impaired.   You are being prescribed a medication which may make you sleepy. Please follow up of listed precautions for at least 24 hours after taking one dose.

## 2017-08-19 NOTE — ED Triage Notes (Signed)
Pt reports emesis and diarrhea for the past 2 days accompanied by generalized body pain. Pt also reports she has palpitations. Pt reports she normally drinks etoh daily, but has been unable to drink etoh due to emesis. Pt reports slight shaking and HA.

## 2017-08-19 NOTE — ED Provider Notes (Signed)
[place] DEPT Provider Note   CSN: [id number] Arrival date & time: 08/19/17  1408     History   Chief Complaint Chief Complaint  Patient presents with  . Emesis  . Palpitations    HPI Allison [name] is a 46 y.o. female who presents today for evaluation of nausea, vomiting, diarrhea, generalized body pain for 3 days. She reports that over the past week she has been trying to decrease her daily alcohol use, states that she normally has 3 drinks and 3 beers a day.  She reports that she stopped drinking 3 days ago and shortly after that began developing all of these symptoms. Additionally she reports chills, diaphoresis, and generally not feeling well. While chart review shows that patient reported palpations she says that they have been present for a while and that is why she is not here today.  HPI  Past Medical History:  Diagnosis Date  . GERD (gastroesophageal reflux disease)   . Hypertension     Patient Active Problem List   Diagnosis Date Noted  . Anemia 08/19/2017  . Acute pyelonephritis 05/22/2016  . Pyelonephritis 05/22/2016  . Hyperglycemia 05/22/2016  . Sepsis ([place]) 05/22/2016  . Hypertension 05/22/2016    History reviewed. No pertinent surgical history.  OB History    No data available       Home Medications    Prior to Admission medications   Medication Sig Start Date End Date Taking? Authorizing Provider  cetirizine (ZYRTEC) 10 MG tablet Take 10 mg by mouth daily as needed for allergies.   Yes [provider]  losartan (COZAAR) 50 MG tablet Take 1 tablet (50 mg total) by mouth daily. 05/27/16  Yes Florencia Reasons, MD  metFORMIN (GLUCOPHAGE) 500 MG tablet Take 1 tablet (500 mg total) by mouth 2 (two) times daily with a meal. 05/27/16  Yes Florencia Reasons, MD  blood glucose meter kit and supplies KIT Dispense based on patient and insurance preference. Use up to four times daily as directed. (FOR ICD-9 250.00, 250.01). Patient not taking: Reported on  08/19/2017 05/27/16   Florencia Reasons, MD  carvedilol (COREG) 6.25 MG tablet Take 1 tablet (6.25 mg total) by mouth 2 (two) times daily with a meal. Patient not taking: Reported on 08/19/2017 05/27/16   Florencia Reasons, MD  cephALEXin (KEFLEX) 500 MG capsule Take 1 capsule (500 mg total) by mouth 2 (two) times daily. 08/19/17 08/24/17  Lorin Glass, PA-C  chlordiazePOXIDE (LIBRIUM) 25 MG capsule 52m PO TID x 1D, then 25-527mPO BID X 1D, then 25-5072mO QD X 1D 08/19/17   HamLorin GlassA-C  ondansetron (ZOFRAN) 4 MG tablet Take 1 tablet (4 mg total) by mouth every 6 (six) hours. 08/19/17   HamLorin GlassA-C    Family History Family History  Problem Relation Age of Onset  . Hypertension Mother   . Diabetes Mother   . Hypertension Father     Social History Social History  Substance Use Topics  . Smoking status: Never Smoker  . Smokeless tobacco: Not on file  . Alcohol use 7.0 oz/week    14 drink(s) per week     Comment: 2 glasses per of beer or liquor     Allergies   Lisinopril   Review of Systems Review of Systems  Constitutional: Positive for chills and fatigue. Negative for fever.  HENT: Negative for ear pain and sore throat.   Eyes: Negative for pain and visual disturbance.  Respiratory: Negative for cough  and shortness of breath.   Cardiovascular: Positive for palpitations. Negative for chest pain.  Gastrointestinal: Positive for diarrhea, nausea and vomiting. Negative for abdominal pain.  Genitourinary: Positive for dysuria, frequency, urgency and vaginal bleeding (Normal, is on menstrual cycle.). Negative for decreased urine volume, difficulty urinating, hematuria, vaginal discharge and vaginal pain.  Musculoskeletal: Negative for arthralgias and back pain.  Skin: Negative for color change and rash.  Neurological: Negative for dizziness, seizures, syncope, weakness, light-headedness and headaches.  All other systems reviewed and are negative.   Physical  Exam Updated Vital Signs BP (!) 177/94 (BP Location: Left Arm)   Pulse 76   Temp 98.6 F (37 C) (Oral)   Resp 18   LMP 08/18/2017   SpO2 100%   Physical Exam  Constitutional: She appears well-developed and well-nourished. No distress.  HENT:  Head: Normocephalic and atraumatic.  Eyes: Conjunctivae are normal. Right eye exhibits no discharge. Left eye exhibits no discharge. No scleral icterus.  Neck: Normal range of motion.  Cardiovascular: Normal rate, regular rhythm, normal heart sounds and intact distal pulses.  Exam reveals no friction rub.   No murmur heard. Pulmonary/Chest: Effort normal and breath sounds normal. No stridor. No respiratory distress.  Abdominal: Soft. Normal appearance and bowel sounds are normal. She exhibits no distension and no mass. There is no tenderness. There is no rigidity, no guarding, no CVA tenderness, no tenderness at McBurney's point and negative Murphy's sign.  Musculoskeletal: She exhibits no edema or deformity.  Neurological: She is alert. She exhibits normal muscle tone. Coordination normal.  Skin: Skin is warm and dry. She is not diaphoretic.  Psychiatric: She has a normal mood and affect. Her behavior is normal. Her mood appears not anxious.  Nursing note and vitals reviewed.    ED Treatments / Results  Labs (all labs ordered are listed, but only abnormal results are displayed) Labs Reviewed  COMPREHENSIVE METABOLIC PANEL - Abnormal; Notable for the following:       Result Value   Potassium 3.2 (*)    Chloride 96 (*)    Glucose, Bld 116 (*)    Total Protein 9.4 (*)    AST 137 (*)    Alkaline Phosphatase 144 (*)    All other components within normal limits  CBC - Abnormal; Notable for the following:    WBC 10.7 (*)    Hemoglobin 7.6 (*)    HCT 26.6 (*)    MCV 61.9 (*)    MCH 17.7 (*)    MCHC 28.6 (*)    RDW 22.3 (*)    All other components within normal limits  URINALYSIS, ROUTINE W REFLEX MICROSCOPIC - Abnormal; Notable for the  following:    Color, Urine AMBER (*)    APPearance HAZY (*)    Glucose, UA 50 (*)    Hgb urine dipstick LARGE (*)    Bilirubin Urine MODERATE (*)    Ketones, ur 5 (*)    Protein, ur 100 (*)    Nitrite POSITIVE (*)    Leukocytes, UA MODERATE (*)    Squamous Epithelial / LPF 6-30 (*)    All other components within normal limits  OCCULT BLOOD, POC DEVICE - Abnormal; Notable for the following:    Fecal Occult Bld POSITIVE (*)    All other components within normal limits  LIPASE, BLOOD  OCCULT BLOOD X 1 CARD TO LAB, STOOL  I-STAT TROPONIN, ED  POCT I-STAT TROPONIN I  I-STAT BETA HCG BLOOD, ED ([facility], [facility], [facility] ONLY)  I-STAT BETA HCG BLOOD, ED (NOT ORDERABLE)    EKG  EKG Interpretation  Date/Time:  Saturday August 19 2017 16:08:52 EDT Ventricular Rate:  86 PR Interval:    QRS Duration: 85 QT Interval:  445 QTC Calculation: 533 R Axis:   31 Text Interpretation:  Sinus rhythm Probable anterior infarct, old Prolonged QT interval Baseline wander in lead(s) V6 Confirmed by Davonna Belling [phone]) on 08/19/2017 10:35:51 PM       Radiology Dg Chest 2 View  Result Date: 08/19/2017 CLINICAL DATA:  Heart palpitations for 1 month. Weakness, nausea, and chills for 2 days. EXAM: CHEST  2 VIEW COMPARISON:  05/26/2016 FINDINGS: The heart size and mediastinal contours are within normal limits. Both lungs are clear. The visualized skeletal structures are unremarkable. IMPRESSION: No active cardiopulmonary disease. Electronically Signed   By: Earle Gell M.D.   On: 08/19/2017 16:27    Procedures Procedures (including critical care time)  Medications Ordered in ED Medications  LORazepam (ATIVAN) injection 0-4 mg (2 mg Intravenous Given 08/19/17 2056)    Or  LORazepam (ATIVAN) tablet 0-4 mg ( Oral See Alternative 08/19/17 2056)  LORazepam (ATIVAN) injection 0-4 mg (not administered)    Or  LORazepam (ATIVAN) tablet 0-4 mg (not administered)  thiamine (VITAMIN B-1) tablet 100 mg ( Oral See  Alternative 08/19/17 2056)    Or  thiamine (B-1) injection 100 mg (100 mg Intravenous Given 08/19/17 2056)  sodium chloride 0.9 % bolus 1,000 mL (1,000 mLs Intravenous New Bag/Given 08/19/17 2056)  ondansetron (ZOFRAN) injection 4 mg (4 mg Intravenous Given 08/19/17 2056)     Initial Impression / Assessment and Plan / ED Course  I have reviewed the triage vital signs and the nursing notes.  Pertinent labs & imaging results that were available during my care of the patient were reviewed by me and considered in my medical decision making (see chart for details).    Gwenyth Allegra presents For evaluation of 3 days of nausea, vomiting, diarrhea, abdominal pain, generally not feeling well after she stopped drinking alcohol. She normally drinks 6 drinks a day. Her CIWA score was assessed and she was given ativan, along with zofran, fluids and thiamine which improved her symptoms.  She was found to be anemic and her stool was guaiac positive.  She is not symptomatic with her anemia and chart review shows that it she is almost at her baseline level.  She was instructed to start taking iron pills and to follow up with both her PCP and given referral to GI for her guaiac result.   She was given librium taper, zofran RX for her symptoms.  Urine was questionably infected with moderate leuks, nitrite positive, and blood present (however patient is menstruating).  Given presence of dysuria, increased urgency will treat UTI with Keflex. She is without CVA tenderness, or fevers, low suspicion for pyelonephritis.   At this time there does not appear to be any evidence of an acute emergency medical condition and the patient appears stable for discharge with appropriate outpatient follow up.Diagnosis was discussed with patient who verbalizes understanding and is agreeable to discharge. Pt case discussed with Dr. Alvino Chapel who agrees with my plan.    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   CHLORDIAZEPOXIDE (LIBRIUM) 25 MG CAPSULE    51m PO TID x 1D, then 25-535mPO BID X 1D, then 25-508mO QD X 1D   ONDANSETRON (ZOFRAN) 4 MG TABLET    Take 1 tablet (4 mg total) by mouth every 6 (six) hours.     HamLorin GlassA-C 08/21/17 0040    PicDavonna BellingD 08/26/17 [phone]

## 2018-02-28 ENCOUNTER — Encounter (HOSPITAL_COMMUNITY): Payer: Self-pay

## 2018-02-28 DIAGNOSIS — R197 Diarrhea, unspecified: Secondary | ICD-10-CM | POA: Insufficient documentation

## 2018-02-28 DIAGNOSIS — R112 Nausea with vomiting, unspecified: Secondary | ICD-10-CM | POA: Insufficient documentation

## 2018-02-28 DIAGNOSIS — R748 Abnormal levels of other serum enzymes: Secondary | ICD-10-CM | POA: Insufficient documentation

## 2018-02-28 DIAGNOSIS — R809 Proteinuria, unspecified: Secondary | ICD-10-CM | POA: Insufficient documentation

## 2018-02-28 DIAGNOSIS — D649 Anemia, unspecified: Secondary | ICD-10-CM | POA: Insufficient documentation

## 2018-02-28 DIAGNOSIS — I10 Essential (primary) hypertension: Secondary | ICD-10-CM | POA: Insufficient documentation

## 2018-02-28 DIAGNOSIS — E876 Hypokalemia: Secondary | ICD-10-CM | POA: Insufficient documentation

## 2018-02-28 DIAGNOSIS — M545 Low back pain: Secondary | ICD-10-CM | POA: Insufficient documentation

## 2018-02-28 LAB — COMPREHENSIVE METABOLIC PANEL
ALT: 43 U/L (ref 14–54)
ANION GAP: 15 (ref 5–15)
AST: 172 U/L — ABNORMAL HIGH (ref 15–41)
Albumin: 3.6 g/dL (ref 3.5–5.0)
Alkaline Phosphatase: 196 U/L — ABNORMAL HIGH (ref 38–126)
BUN: 8 mg/dL (ref 6–20)
CO2: 28 mmol/L (ref 22–32)
CREATININE: 0.77 mg/dL (ref 0.44–1.00)
Calcium: 8.5 mg/dL — ABNORMAL LOW (ref 8.9–10.3)
Chloride: 96 mmol/L — ABNORMAL LOW (ref 101–111)
GFR calc non Af Amer: >60 mL/min (ref >60)
Glucose, Bld: 111 mg/dL — ABNORMAL HIGH (ref 65–99)
Potassium: 3 mmol/L — ABNORMAL LOW (ref 3.5–5.1)
Sodium: 139 mmol/L (ref 135–145)
Total Bilirubin: 1.1 mg/dL (ref 0.3–1.2)
Total Protein: 8.9 g/dL — ABNORMAL HIGH (ref 6.5–8.1)

## 2018-02-28 LAB — CBC
HEMATOCRIT: 27.2 % — AB (ref 36.0–46.0)
HEMOGLOBIN: 7.6 g/dL — AB (ref 12.0–15.0)
MCH: 16.9 pg — AB (ref 26.0–34.0)
MCHC: 27.9 g/dL — ABNORMAL LOW (ref 30.0–36.0)
MCV: 60.6 fL — AB (ref 78.0–100.0)
Platelets: 242 10*3/uL (ref 150–400)
RBC: 4.49 MIL/uL (ref 3.87–5.11)
RDW: 21.4 % — ABNORMAL HIGH (ref 11.5–15.5)
WBC: 5 10*3/uL (ref 4.0–10.5)

## 2018-02-28 LAB — I-STAT BETA HCG BLOOD, ED (MC, WL, AP ONLY): I-stat hCG, quantitative: <5 m[IU]/mL (ref <5)

## 2018-02-28 LAB — LIPASE, BLOOD: LIPASE: 31 U/L (ref 11–51)

## 2018-02-28 MED ORDER — ONDANSETRON 4 MG PO TBDP
4.0000 mg | ORAL_TABLET | Freq: Once | ORAL | Status: AC | PRN
  Administered 2018-02-28: 4 mg via ORAL
  Filled 2018-02-28: qty 1

## 2018-02-28 NOTE — ED Triage Notes (Signed)
Pt complains of vomiting and diarrhea since this am, she also states that her throat burns and she can't keep anything down

## 2018-03-01 ENCOUNTER — Emergency Department (HOSPITAL_COMMUNITY)
Admission: EM | Admit: 2018-03-01 | Discharge: 2018-03-01 | Disposition: A | Discharge status: Home / Self Care | Payer: Self-pay | Attending: Emergency Medicine | Admitting: Emergency Medicine

## 2018-03-01 ENCOUNTER — Emergency Department (HOSPITAL_COMMUNITY): Payer: Self-pay

## 2018-03-01 DIAGNOSIS — R748 Abnormal levels of other serum enzymes: Secondary | ICD-10-CM

## 2018-03-01 DIAGNOSIS — D649 Anemia, unspecified: Secondary | ICD-10-CM

## 2018-03-01 DIAGNOSIS — R197 Diarrhea, unspecified: Secondary | ICD-10-CM

## 2018-03-01 DIAGNOSIS — E876 Hypokalemia: Secondary | ICD-10-CM

## 2018-03-01 DIAGNOSIS — R809 Proteinuria, unspecified: Secondary | ICD-10-CM

## 2018-03-01 DIAGNOSIS — R112 Nausea with vomiting, unspecified: Secondary | ICD-10-CM

## 2018-03-01 LAB — URINALYSIS, ROUTINE W REFLEX MICROSCOPIC
BILIRUBIN URINE: NEGATIVE
GLUCOSE, UA: NEGATIVE mg/dL
KETONES UR: 80 mg/dL — AB
LEUKOCYTES UA: NEGATIVE
Nitrite: NEGATIVE
PH: 8 (ref 5.0–8.0)
Protein, ur: >=300 mg/dL — AB
Specific Gravity, Urine: 1.027 (ref 1.005–1.030)
WBC, UA: NONE SEEN WBC/hpf (ref 0–5)

## 2018-03-01 MED ORDER — PROMETHAZINE HCL 25 MG PO TABS: 12.5000 mg | ORAL_TABLET | Freq: Once | ORAL | Status: DC

## 2018-03-01 MED ORDER — SODIUM CHLORIDE 0.9 % IV BOLUS (SEPSIS): 1000.0000 mL | Freq: Once | INTRAVENOUS | Status: DC

## 2018-03-01 MED ORDER — ONDANSETRON HCL 4 MG/2ML IJ SOLN
4.0000 mg | Freq: Once | INTRAMUSCULAR | Status: AC
  Administered 2018-03-01: 4 mg via INTRAVENOUS
  Filled 2018-03-01: qty 2

## 2018-03-01 MED ORDER — SODIUM CHLORIDE 0.9 % IV BOLUS (SEPSIS)
500.0000 mL | Freq: Once | INTRAVENOUS | Status: AC
  Administered 2018-03-01: 500 mL via INTRAVENOUS

## 2018-03-01 MED ORDER — ACETAMINOPHEN 325 MG PO TABS
650.0000 mg | ORAL_TABLET | Freq: Once | ORAL | Status: DC
Start: 2018-03-01 — End: 2018-03-01
  Filled 2018-03-01: qty 2

## 2018-03-01 MED ORDER — PANTOPRAZOLE SODIUM 40 MG IV SOLR
40.0000 mg | Freq: Once | INTRAVENOUS | Status: AC
  Administered 2018-03-01: 40 mg via INTRAVENOUS
  Filled 2018-03-01: qty 40

## 2018-03-01 MED ORDER — KETOROLAC TROMETHAMINE 15 MG/ML IJ SOLN
15.0000 mg | Freq: Once | INTRAMUSCULAR | Status: AC
  Administered 2018-03-01: 15 mg via INTRAVENOUS
  Filled 2018-03-01: qty 1

## 2018-03-01 MED ORDER — CHLORDIAZEPOXIDE HCL 25 MG PO CAPS: ORAL_CAPSULE | ORAL | 0 refills | Status: DC

## 2018-03-01 MED ORDER — FERROUS SULFATE 325 (65 FE) MG PO TABS: 325.0000 mg | ORAL_TABLET | Freq: Every day | ORAL | 0 refills | Status: DC

## 2018-03-01 NOTE — ED Notes (Signed)
Patient states this is a recurring issue with her menstrual cycle for the past 3 months. Symptoms begin with the start of her period and resolve when her cycle ends.

## 2018-03-01 NOTE — Discharge Instructions (Signed)
You were given a prescription for iron pills, which you should start daily.  You were also given a prescription for a Librium taper, which you may start taking to help you reduce your alcohol intake. Please follow the instructions listed on the discharge paperwork for this medication.  You are also given resources to follow-up with counselors regarding alcohol use.  [facility] to schedule an appointment for follow-up within 1 week if possible, and to establish primary care.  You will need to follow up with the clinic for further management of your high blood pressure. You may also contact our medical clinic that you used to be seen at as well.  You were given a referral for gastroenterology because you had a positive Hemoccult test in the past, and you should follow-up with him for further evaluation of this.  You will need to return to the emergency department if you are having any chest pain, shortness of breath, persistent vomiting, fevers, abdominal pain, or any new or worsening symptoms.

## 2018-03-01 NOTE — ED Provider Notes (Signed)
[place] DEPT Provider Note   CSN: [id number] Arrival date & time: 02/28/18  2127     History   Chief Complaint Chief Complaint  Patient presents with  . Emesis    HPI Allison [name] is a 47 y.o. female.  HPI   Pt is a 47 y/o female presenting to the ED c/o NVD and dull bilat lower back pressure that began 2 days ago.    Denies bloody emesis, bloody BMs, or dark/tarry stools. Patient states that she has similar symptoms every time she starts her menses for the last several months.  States she started her menses 3 days ago.  States the symptoms are no different than symptoms with prior menses.  Also complains of bilat lower back pain, sore throat (secondary to vomiting), chills/sweats, body aches. Denies documented fevers, abdominal pain. Pt states she has had left upper aching 8/10 CP for the last 2 days that is intermittent in nature and only present after she vomits. States episodes last about 20 minutes after she vomits.  Denies any persistent pain in her chest.  Not worse with exertion, not associated with diaphoresis.  Does not radiate.  Also reports palpitations, but states that this has been ongoing for several months and they are unchanged..  Denies dyspnea on exertion. States she has a history of anemia but is not currently taking iron pills.  Was given referral to GI during last visit, but has not followed up with him. States has similar sxs when she is on her menses.  She denies headaches, vision changes, lightheadedness, numbness. Pt states she drinks ETOH. She drinks about 2-3 shots per day. Last drink was yesterday around 10-11am.   Past Medical History:  Diagnosis Date  . GERD (gastroesophageal reflux disease)   . Hypertension     Patient Active Problem List   Diagnosis Date Noted  . Anemia 08/19/2017  . Acute pyelonephritis 05/22/2016  . Pyelonephritis 05/22/2016  . Hyperglycemia 05/22/2016  . Sepsis ([place]) 05/22/2016  .  Hypertension 05/22/2016    History reviewed. No pertinent surgical history.  OB History    No data available       Home Medications    Prior to Admission medications   Medication Sig Start Date End Date Taking? Authorizing Provider  cetirizine (ZYRTEC) 10 MG tablet Take 10 mg by mouth daily as needed for allergies.   Yes [provider]  blood glucose meter kit and supplies KIT Dispense based on patient and insurance preference. Use up to four times daily as directed. (FOR ICD-9 250.00, 250.01). Patient not taking: Reported on 08/19/2017 05/27/16   Florencia Reasons, MD  carvedilol (COREG) 6.25 MG tablet Take 1 tablet (6.25 mg total) by mouth 2 (two) times daily with a meal. Patient not taking: Reported on 08/19/2017 05/27/16   Florencia Reasons, MD  chlordiazePOXIDE (LIBRIUM) 25 MG capsule 71m PO TID x 1D, then 25-5107mPO BID X 1D, then 25-5022mO QD X 1D 03/01/18   Dmya Long S, PA-C  ferrous sulfate 325 (65 FE) MG tablet Take 1 tablet (325 mg total) by mouth daily with breakfast. 03/01/18   Naomi Castrogiovanni S, PA-C  losartan (COZAAR) 50 MG tablet Take 1 tablet (50 mg total) by mouth daily. Patient not taking: Reported on 03/01/2018 05/27/16   Xu,Florencia ReasonsD  metFORMIN (GLUCOPHAGE) 500 MG tablet Take 1 tablet (500 mg total) by mouth 2 (two) times daily with a meal. Patient not taking: Reported on 03/01/2018 05/27/16  Florencia Reasons, MD  ondansetron (ZOFRAN) 4 MG tablet Take 1 tablet (4 mg total) by mouth every 6 (six) hours. Patient not taking: Reported on 03/01/2018 08/19/17   Lorin Glass, PA-C    Family History Family History  Problem Relation Age of Onset  . Hypertension Mother   . Diabetes Mother   . Hypertension Father     Social History Social History   Tobacco Use  . Smoking status: Never Smoker  . Smokeless tobacco: Never Used  Substance Use Topics  . Alcohol use: Yes    Alcohol/week: 7.0 oz    Types: 14 Standard drinks or equivalent per week    Comment: 2 glasses per of beer or  liquor  . Drug use: No     Allergies   Lisinopril   Review of Systems Review of Systems  Constitutional: Positive for chills and diaphoresis.  HENT: Positive for sore throat. Negative for congestion.   Eyes: Negative for visual disturbance.  Respiratory: Negative for cough and shortness of breath.   Cardiovascular: Positive for chest pain and palpitations. Negative for leg swelling.  Gastrointestinal: Positive for diarrhea, nausea and vomiting. Negative for abdominal pain, blood in stool and constipation.  Genitourinary: Negative for difficulty urinating, dysuria, enuresis, frequency, hematuria and urgency.  Musculoskeletal: Positive for back pain. Negative for gait problem.  Skin: Negative for rash.  Neurological: Negative for dizziness, weakness, light-headedness, numbness and headaches.     Physical Exam Updated Vital Signs BP (!) 197/89 (BP Location: Left Arm)   Pulse 78   Temp 99.2 F (37.3 C) (Oral)   Resp 14   LMP 02/28/2018   SpO2 100%   Physical Exam  Constitutional: She appears well-developed and well-nourished.  Appears uncomfortable  HENT:  Head: Normocephalic and atraumatic.  Eyes: Conjunctivae and EOM are normal. Pupils are equal, round, and reactive to light.  Neck: Neck supple.  Cardiovascular: Normal rate, regular rhythm, normal heart sounds and intact distal pulses.  No murmur heard. Pulmonary/Chest: Effort normal and breath sounds normal. No respiratory distress. She has no wheezes.  Abdominal: Soft. There is no tenderness.  Musculoskeletal: She exhibits no edema.  ttp to bilat lower back to sciatic notches the reproduces her pain, no midline ttp  Neurological: She is alert.  Mental Status:  Alert, thought content appropriate, able to give a coherent history. Speech fluent without evidence of aphasia. Able to follow 2 step commands without difficulty.  Cranial Nerves:  II:  pupils equal, round, reactive to light III,IV, VI: ptosis not present,  extra-ocular motions intact bilaterally  V,VII: smile symmetric, facial light touch sensation equal VIII: hearing grossly normal to voice  X: uvula elevates symmetrically  XI: bilateral shoulder shrug symmetric and strong XII: midline tongue extension without fassiculations Motor:  Normal tone. 5/5 strength of BUE and BLE major muscle groups including strong and equal grip strength and dorsiflexion/plantar flexion Sensory: light touch normal in all extremities. DTRs: patellar DTRs 2+ symmetric b/l Cerebellar: normal finger-to-nose with bilateral upper extremities Gait: normal gait and balance. Able to walk on toes and heels with ease.  CV: 2+ radial and DP/PT pulses  Skin: Skin is warm and dry. She is not diaphoretic.  Psychiatric: She has a normal mood and affect.  Nursing note and vitals reviewed.    ED Treatments / Results  Labs (all labs ordered are listed, but only abnormal results are displayed) Labs Reviewed  COMPREHENSIVE METABOLIC PANEL - Abnormal; Notable for the following components:      Result  Value   Potassium 3.0 (*)    Chloride 96 (*)    Glucose, Bld 111 (*)    Calcium 8.5 (*)    Total Protein 8.9 (*)    AST 172 (*)    Alkaline Phosphatase 196 (*)    All other components within normal limits  CBC - Abnormal; Notable for the following components:   Hemoglobin 7.6 (*)    HCT 27.2 (*)    MCV 60.6 (*)    MCH 16.9 (*)    MCHC 27.9 (*)    RDW 21.4 (*)    All other components within normal limits  URINALYSIS, ROUTINE W REFLEX MICROSCOPIC - Abnormal; Notable for the following components:   Color, Urine AMBER (*)    APPearance HAZY (*)    Hgb urine dipstick MODERATE (*)    Ketones, ur 80 (*)    Protein, ur >=300 (*)    Bacteria, UA FEW (*)    Squamous Epithelial / LPF 0-5 (*)    All other components within normal limits  LIPASE, BLOOD  I-STAT BETA HCG BLOOD, ED ([facility], [facility], [facility] ONLY)    EKG  EKG Interpretation  Date/Time:  Thursday March 01 2018 09:22:35  EST Ventricular Rate:  81 PR Interval:    QRS Duration: 83 QT Interval:  459 QTC Calculation: 533 R Axis:   38 Text Interpretation:  Sinus rhythm Prolonged QT interval Baseline wander in lead(s) III since last tracing no significant change Confirmed by Daleen Bo [id number]) on 03/01/2018 3:05:24 PM       Radiology Dg Chest 2 View  Result Date: 03/01/2018 CLINICAL DATA:  Vomiting and diarrhea since this morning. EXAM: CHEST - 2 VIEW COMPARISON:  08/19/2017 FINDINGS: Normal heart size and mediastinal contours. There is no edema, consolidation, effusion, or pneumothorax. No osseous findings. IMPRESSION: Negative chest. Electronically Signed   By: Monte Fantasia M.D.   On: 03/01/2018 09:50    Procedures Procedures (including critical care time)  Medications Ordered in ED Medications  ondansetron (ZOFRAN-ODT) disintegrating tablet 4 mg (4 mg Oral Given 02/28/18 2228)  ondansetron (ZOFRAN) injection 4 mg (4 mg Intravenous Given 03/01/18 0958)  sodium chloride 0.9 % bolus 500 mL (0 mLs Intravenous Stopped 03/01/18 1215)  ketorolac (TORADOL) 15 MG/ML injection 15 mg (15 mg Intravenous Given 03/01/18 1007)  pantoprazole (PROTONIX) injection 40 mg (40 mg Intravenous Given 03/01/18 1007)     Initial Impression / Assessment and Plan / ED Course  I have reviewed the triage vital signs and the nursing notes.  Pertinent labs & imaging results that were available during my care of the patient were reviewed by me and considered in my medical decision making (see chart for details).    Discussed pt presentation, exam findings, results of workup with Dr. Eulis Foster, who agrees that nature of CP does not sound consistent with ACS and that troponin unnecessary. Also does not feel imaging of brain is necessary given pt is having no headache or visual changes. Feels that current labwork and UA do not require further inpt workup.   Final Clinical Impressions(s) / ED Diagnoses   Final diagnoses:  Nausea vomiting  and diarrhea  Anemia, unspecified type  Elevated liver enzymes  Elevated alkaline phosphatase level  Proteinuria, unspecified type  Hypokalemia   47 year old female presented with nausea, vomiting, diarrhea for the last 3 days.  States she has similar symptoms whenever she has her menses.  No changes in symptoms from prior episodes.  No blood in vomit or stool.  Patient hypertensive, but otherwise vital signs stable and she is afebrile.    With regard to intermittent chest pain, sxs only associated with vomiting and resolved after episodes. Hx not suspicious for ACS. ECG with no ischemic changes. CXR negative for infilatrates, PTX, or widened mediastinum.  With regard to HTN, Has not been taking pressure medications for several months.  No headache, persistent chest pain, shortness of breath, and doubt hypertensive emergency at this time.  Hypertension likely related to patient's symptoms and medication noncompliance.  May also be related to mild alcohol withdrawal.   Pt endorses wanting to quit drinking ETOH y and requested resources to f/u with. Resources given and rx given for librium taper given.  Patient is nontoxic, nonseptic appearing, in no apparent distress.  Patient's symptoms adequately managed in emergency department.  Fluid bolus given.  Labs, imaging and vitals reviewed. Labs with stable anemia to 7.6, elevated liver enzymes (baseline likely related to chronic ETOH use), elevated alk phos (baseline), and normal cr. UA negative for infection, but showed significant proteinuria and ketonuria which were all discussed with pt and she was advised to f/u on. Patient does not meet the SIRS or Sepsis criteria.  On repeat exam patient does not have a surgical abdomin and there are no peritoneal signs.  No indication of appendicitis, bowel obstruction, bowel perforation, cholecystitis, choledocholithiasis, diverticulitis, PID or ectopic pregnancy.  Patient discharged home with symptomatic  treatment as well as rx for po iron to be initiated at once daily. Suspect sxs today are related to pts menstrual cycle rather than an acute bacterial or viral infection. Also given strict instructions for follow-up with their primary care physician and GI.I have also discussed reasons to return immediately to the ER.  Patient expresses understanding and agrees with plan.  ED Discharge Orders        Ordered    chlordiazePOXIDE (LIBRIUM) 25 MG capsule     03/01/18 1139    ferrous sulfate 325 (65 FE) MG tablet  Daily with breakfast     03/01/18 1139       ,  S, PA-C 03/01/18 1827    Daleen Bo, MD 03/02/18 1155

## 2018-06-12 ENCOUNTER — Encounter (HOSPITAL_COMMUNITY): Payer: Self-pay | Admitting: Emergency Medicine

## 2018-06-12 ENCOUNTER — Emergency Department (HOSPITAL_COMMUNITY)
Admission: EM | Admit: 2018-06-12 | Discharge: 2018-06-12 | Disposition: A | Discharge status: Home / Self Care | Payer: Self-pay | Attending: Physician Assistant | Admitting: Physician Assistant

## 2018-06-12 DIAGNOSIS — Z5321 Procedure and treatment not carried out due to patient leaving prior to being seen by health care provider: Secondary | ICD-10-CM | POA: Insufficient documentation

## 2018-06-12 DIAGNOSIS — F10929 Alcohol use, unspecified with intoxication, unspecified: Secondary | ICD-10-CM | POA: Insufficient documentation

## 2018-06-12 LAB — COMPREHENSIVE METABOLIC PANEL
ALBUMIN: 3.3 g/dL — AB (ref 3.5–5.0)
ALK PHOS: 318 U/L — AB (ref 38–126)
ALT: 74 U/L — AB (ref 14–54)
AST: 355 U/L — AB (ref 15–41)
Anion gap: 11 (ref 5–15)
BILIRUBIN TOTAL: 0.9 mg/dL (ref 0.3–1.2)
CALCIUM: 8.7 mg/dL — AB (ref 8.9–10.3)
CO2: 27 mmol/L (ref 22–32)
Chloride: 105 mmol/L (ref 101–111)
Creatinine, Ser: 0.7 mg/dL (ref 0.44–1.00)
GFR calc Af Amer: >60 mL/min (ref >60)
GLUCOSE: 121 mg/dL — AB (ref 65–99)
POTASSIUM: 3.2 mmol/L — AB (ref 3.5–5.1)
Sodium: 143 mmol/L (ref 135–145)
TOTAL PROTEIN: 8.3 g/dL — AB (ref 6.5–8.1)

## 2018-06-12 LAB — CBC
HCT: 27 % — ABNORMAL LOW (ref 36.0–46.0)
HEMOGLOBIN: 7.4 g/dL — AB (ref 12.0–15.0)
MCH: 18.1 pg — AB (ref 26.0–34.0)
MCHC: 27.4 g/dL — ABNORMAL LOW (ref 30.0–36.0)
MCV: 66.2 fL — ABNORMAL LOW (ref 78.0–100.0)
Platelets: 199 10*3/uL (ref 150–400)
RBC: 4.08 MIL/uL (ref 3.87–5.11)
RDW: 22.4 % — ABNORMAL HIGH (ref 11.5–15.5)
WBC: 5.5 10*3/uL (ref 4.0–10.5)

## 2018-06-12 LAB — RAPID URINE DRUG SCREEN, HOSP PERFORMED
Amphetamines: NOT DETECTED
Benzodiazepines: NOT DETECTED
COCAINE: NOT DETECTED
OPIATES: NOT DETECTED
TETRAHYDROCANNABINOL: POSITIVE — AB

## 2018-06-12 LAB — I-STAT BETA HCG BLOOD, ED (MC, WL, AP ONLY): I-stat hCG, quantitative: <5 m[IU]/mL (ref <5)

## 2018-06-12 LAB — ETHANOL: ALCOHOL ETHYL (B): 355 mg/dL — AB (ref <10)

## 2018-06-12 NOTE — ED Triage Notes (Signed)
Patient to ED requesting detox from alcohol, states she drinks about a half pint liquor daily but she needs to stop because she wants to be there more for her kids. Last drink about 2 hours ago. Patient denies SI/HI. No apparent distress noted at this time.

## 2018-06-12 NOTE — ED Notes (Signed)
No answer for vitals  

## 2018-06-12 NOTE — ED Notes (Signed)
No answer for vitals. Called 3 times

## 2019-06-07 ENCOUNTER — Encounter (HOSPITAL_COMMUNITY): Payer: Self-pay | Admitting: Emergency Medicine

## 2019-06-07 ENCOUNTER — Emergency Department (HOSPITAL_COMMUNITY)
Admission: EM | Admit: 2019-06-07 | Discharge: 2019-06-07 | Disposition: A | Discharge status: Home / Self Care | Payer: Self-pay | Attending: Emergency Medicine | Admitting: Emergency Medicine

## 2019-06-07 DIAGNOSIS — F1210 Cannabis abuse, uncomplicated: Secondary | ICD-10-CM | POA: Insufficient documentation

## 2019-06-07 DIAGNOSIS — E876 Hypokalemia: Secondary | ICD-10-CM

## 2019-06-07 DIAGNOSIS — F1010 Alcohol abuse, uncomplicated: Secondary | ICD-10-CM

## 2019-06-07 DIAGNOSIS — F10920 Alcohol use, unspecified with intoxication, uncomplicated: Secondary | ICD-10-CM

## 2019-06-07 DIAGNOSIS — I10 Essential (primary) hypertension: Secondary | ICD-10-CM | POA: Insufficient documentation

## 2019-06-07 LAB — CBC WITH DIFFERENTIAL/PLATELET
Abs Immature Granulocytes: 0.03 10*3/uL (ref 0.00–0.07)
Basophils Absolute: 0.2 10*3/uL — ABNORMAL HIGH (ref 0.0–0.1)
Basophils Relative: 2 %
Eosinophils Absolute: 0.1 10*3/uL (ref 0.0–0.5)
Eosinophils Relative: 2 %
HCT: 33.4 % — ABNORMAL LOW (ref 36.0–46.0)
Hemoglobin: 10 g/dL — ABNORMAL LOW (ref 12.0–15.0)
Immature Granulocytes: 0 %
Lymphocytes Relative: 29 %
Lymphs Abs: 2.1 10*3/uL (ref 0.7–4.0)
MCH: 22.5 pg — ABNORMAL LOW (ref 26.0–34.0)
MCHC: 29.9 g/dL — ABNORMAL LOW (ref 30.0–36.0)
MCV: 75.2 fL — ABNORMAL LOW (ref 80.0–100.0)
Monocytes Absolute: 0.5 10*3/uL (ref 0.1–1.0)
Monocytes Relative: 7 %
Neutro Abs: 4.3 10*3/uL (ref 1.7–7.7)
Neutrophils Relative %: 60 %
Platelets: 320 10*3/uL (ref 150–400)
RBC: 4.44 MIL/uL (ref 3.87–5.11)
RDW: 20 % — ABNORMAL HIGH (ref 11.5–15.5)
WBC: 7.2 10*3/uL (ref 4.0–10.5)
nRBC: 0 % (ref 0.0–0.2)

## 2019-06-07 LAB — COMPREHENSIVE METABOLIC PANEL
ALT: 24 U/L (ref 0–44)
AST: 98 U/L — ABNORMAL HIGH (ref 15–41)
Albumin: 2 g/dL — ABNORMAL LOW (ref 3.5–5.0)
Alkaline Phosphatase: 199 U/L — ABNORMAL HIGH (ref 38–126)
Anion gap: 7 (ref 5–15)
BUN: 5 mg/dL — ABNORMAL LOW (ref 6–20)
CO2: 22 mmol/L (ref 22–32)
Calcium: 7.5 mg/dL — ABNORMAL LOW (ref 8.9–10.3)
Chloride: 114 mmol/L — ABNORMAL HIGH (ref 98–111)
Creatinine, Ser: 0.93 mg/dL (ref 0.44–1.00)
GFR calc Af Amer: >60 mL/min (ref >60)
GFR calc non Af Amer: >60 mL/min (ref >60)
Glucose, Bld: 87 mg/dL (ref 70–99)
Potassium: 2.5 mmol/L — CL (ref 3.5–5.1)
Sodium: 143 mmol/L (ref 135–145)
Total Bilirubin: 3.6 mg/dL — ABNORMAL HIGH (ref 0.3–1.2)
Total Protein: 7.7 g/dL (ref 6.5–8.1)

## 2019-06-07 LAB — ETHANOL: Alcohol, Ethyl (B): 519 mg/dL (ref <10)

## 2019-06-07 MED ORDER — MAGNESIUM OXIDE 400 (241.3 MG) MG PO TABS: 800.0000 mg | ORAL_TABLET | Freq: Once | ORAL | Status: DC

## 2019-06-07 MED ORDER — LACTATED RINGERS IV BOLUS
1000.0000 mL | Freq: Once | INTRAVENOUS | Status: AC
  Administered 2019-06-07: 1000 mL via INTRAVENOUS

## 2019-06-07 MED ORDER — POTASSIUM CHLORIDE CRYS ER 20 MEQ PO TBCR: 60.0000 meq | EXTENDED_RELEASE_TABLET | Freq: Once | ORAL | Status: DC

## 2019-06-07 NOTE — ED Notes (Signed)
Bed: [facility] Expected date:  Expected time:  Means of arrival:  Comments: 
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
## 2019-06-07 NOTE — ED Notes (Signed)
Pt continues to insult and is uncooperative with this RN.

## 2019-06-07 NOTE — ED Notes (Signed)
Pt refused to let the tech and nurse change her into clean scrubs since her clothes are wet and dirty.

## 2019-06-07 NOTE — ED Notes (Addendum)
Pt able to walk without assistance and talking clearly. Pt walked with security and nurse.  Pt trying to leave through EMS bay multiple times.  Pt's IVC has been resended.  Pt demanding to leave at this time  MD aware and agrees for discharging patient.  Pt refused D/C vitals and discharge signature.  Pt rude and verbally abusive/assulting to staff during discharge time and entire ED stay.

## 2019-06-07 NOTE — ED Notes (Signed)
Pt still too intoxicated to walk at this time

## 2019-06-07 NOTE — ED Notes (Addendum)
Pt refused medications.

## 2019-06-07 NOTE — ED Notes (Signed)
Ebony NT changed pt's linens

## 2019-06-07 NOTE — ED Notes (Addendum)
Pt states she has anxiety and depression.  When this RN asked what she takes and if she would take something to help, she said to "fuck off!"

## 2019-06-07 NOTE — ED Notes (Signed)
Pt's IVC'd paperwork was resended.  Pt medically discharged when sober enough to walk.

## 2019-06-07 NOTE — ED Notes (Signed)
This RN changed the pt's linens.  Pt very agitated when asked to sit in a chair at bedside so this RN could change her linens.

## 2019-06-07 NOTE — ED Notes (Addendum)
IVC papers filed by the pt's husband for this patient were brought in by [employer] at this time.
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
## 2019-06-07 NOTE — ED Notes (Signed)
Pt continually trying to leave.

## 2019-06-08 ENCOUNTER — Encounter (HOSPITAL_COMMUNITY): Payer: Self-pay | Admitting: Emergency Medicine

## 2019-06-08 ENCOUNTER — Emergency Department (HOSPITAL_COMMUNITY)
Admission: EM | Admit: 2019-06-08 | Discharge: 2019-06-10 | Disposition: A | Discharge status: Other Acute Inpatient Hospital | Payer: Self-pay | Attending: Emergency Medicine | Admitting: Emergency Medicine

## 2019-06-08 DIAGNOSIS — Z20828 Contact with and (suspected) exposure to other viral communicable diseases: Secondary | ICD-10-CM | POA: Insufficient documentation

## 2019-06-08 DIAGNOSIS — R45851 Suicidal ideations: Secondary | ICD-10-CM | POA: Insufficient documentation

## 2019-06-08 DIAGNOSIS — Z79899 Other long term (current) drug therapy: Secondary | ICD-10-CM | POA: Insufficient documentation

## 2019-06-08 DIAGNOSIS — I10 Essential (primary) hypertension: Secondary | ICD-10-CM | POA: Insufficient documentation

## 2019-06-08 DIAGNOSIS — F319 Bipolar disorder, unspecified: Secondary | ICD-10-CM | POA: Insufficient documentation

## 2019-06-08 LAB — COMPREHENSIVE METABOLIC PANEL
ALT: 29 U/L (ref 0–44)
AST: 120 U/L — ABNORMAL HIGH (ref 15–41)
Albumin: 2.1 g/dL — ABNORMAL LOW (ref 3.5–5.0)
Alkaline Phosphatase: 197 U/L — ABNORMAL HIGH (ref 38–126)
Anion gap: 12 (ref 5–15)
BUN: <5 mg/dL — ABNORMAL LOW (ref 6–20)
CO2: 20 mmol/L — ABNORMAL LOW (ref 22–32)
Calcium: 7.9 mg/dL — ABNORMAL LOW (ref 8.9–10.3)
Chloride: 113 mmol/L — ABNORMAL HIGH (ref 98–111)
Creatinine, Ser: 1.03 mg/dL — ABNORMAL HIGH (ref 0.44–1.00)
GFR calc Af Amer: >60 mL/min (ref >60)
GFR calc non Af Amer: >60 mL/min (ref >60)
Glucose, Bld: 83 mg/dL (ref 70–99)
Potassium: 2.8 mmol/L — ABNORMAL LOW (ref 3.5–5.1)
Sodium: 145 mmol/L (ref 135–145)
Total Bilirubin: 3.7 mg/dL — ABNORMAL HIGH (ref 0.3–1.2)
Total Protein: 8 g/dL (ref 6.5–8.1)

## 2019-06-08 LAB — CBC
HCT: 30.2 % — ABNORMAL LOW (ref 36.0–46.0)
Hemoglobin: 9.4 g/dL — ABNORMAL LOW (ref 12.0–15.0)
MCH: 22.7 pg — ABNORMAL LOW (ref 26.0–34.0)
MCHC: 31.1 g/dL (ref 30.0–36.0)
MCV: 72.8 fL — ABNORMAL LOW (ref 80.0–100.0)
Platelets: 276 10*3/uL (ref 150–400)
RBC: 4.15 MIL/uL (ref 3.87–5.11)
RDW: 20 % — ABNORMAL HIGH (ref 11.5–15.5)
WBC: 9.7 10*3/uL (ref 4.0–10.5)
nRBC: 0 % (ref 0.0–0.2)

## 2019-06-08 LAB — I-STAT BETA HCG BLOOD, ED (MC, WL, AP ONLY): I-stat hCG, quantitative: 7.3 m[IU]/mL — ABNORMAL HIGH (ref <5)

## 2019-06-08 LAB — ETHANOL: Alcohol, Ethyl (B): 342 mg/dL (ref <10)

## 2019-06-08 MED ORDER — INSULIN ASPART 100 UNIT/ML ~~LOC~~ SOLN: 0.0000 [IU] | Freq: Three times a day (TID) | SUBCUTANEOUS | Status: DC

## 2019-06-08 MED ORDER — LORAZEPAM 2 MG/ML IJ SOLN: 0.0000 mg | Freq: Four times a day (QID) | INTRAMUSCULAR | Status: DC

## 2019-06-08 MED ORDER — ONDANSETRON HCL 4 MG PO TABS
4.0000 mg | ORAL_TABLET | Freq: Three times a day (TID) | ORAL | Status: DC | PRN
  Administered 2019-06-09: 4 mg via ORAL
  Filled 2019-06-08: qty 1

## 2019-06-08 MED ORDER — METFORMIN HCL 500 MG PO TABS
500.0000 mg | ORAL_TABLET | Freq: Two times a day (BID) | ORAL | Status: DC
  Administered 2019-06-09: 17:00:00 500 mg via ORAL
  Filled 2019-06-08 (×2): qty 1

## 2019-06-08 MED ORDER — LOSARTAN POTASSIUM 50 MG PO TABS
50.0000 mg | ORAL_TABLET | Freq: Every day | ORAL | Status: DC
  Administered 2019-06-09: 50 mg via ORAL
  Filled 2019-06-08: qty 1

## 2019-06-08 MED ORDER — THIAMINE HCL 100 MG/ML IJ SOLN: 100.0000 mg | Freq: Every day | INTRAMUSCULAR | Status: DC

## 2019-06-08 MED ORDER — ALUM & MAG HYDROXIDE-SIMETH 200-200-20 MG/5ML PO SUSP: 30.0000 mL | Freq: Four times a day (QID) | ORAL | Status: DC | PRN

## 2019-06-08 MED ORDER — VITAMIN B-1 100 MG PO TABS
100.0000 mg | ORAL_TABLET | Freq: Every day | ORAL | Status: DC
  Administered 2019-06-09: 100 mg via ORAL
  Filled 2019-06-08: qty 1

## 2019-06-08 MED ORDER — LORAZEPAM 1 MG PO TABS: 0.0000 mg | ORAL_TABLET | Freq: Four times a day (QID) | ORAL | Status: DC

## 2019-06-08 MED ORDER — POTASSIUM CHLORIDE CRYS ER 20 MEQ PO TBCR
40.0000 meq | EXTENDED_RELEASE_TABLET | Freq: Once | ORAL | Status: AC
  Administered 2019-06-09: 40 meq via ORAL
  Filled 2019-06-08: qty 2

## 2019-06-08 MED ORDER — LORAZEPAM 1 MG PO TABS: 0.0000 mg | ORAL_TABLET | Freq: Two times a day (BID) | ORAL | Status: DC

## 2019-06-08 MED ORDER — CARVEDILOL 12.5 MG PO TABS
6.2500 mg | ORAL_TABLET | Freq: Two times a day (BID) | ORAL | Status: DC
  Administered 2019-06-09 (×2): 6.25 mg via ORAL
  Filled 2019-06-08 (×2): qty 1

## 2019-06-08 MED ORDER — LORAZEPAM 2 MG/ML IJ SOLN: 0.0000 mg | Freq: Two times a day (BID) | INTRAMUSCULAR | Status: DC

## 2019-06-08 NOTE — ED Notes (Signed)
Pt in restroom changing.

## 2019-06-08 NOTE — ED Triage Notes (Signed)
Patient arrived with [employer] officers - IVC , suicidal ( multiple attempts  to jump in front of cars ) and alcoholism .
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
## 2019-06-09 ENCOUNTER — Other Ambulatory Visit: Payer: Self-pay

## 2019-06-09 LAB — I-STAT CHEM 8, ED
BUN: <3 mg/dL — ABNORMAL LOW (ref 6–20)
BUN: <3 mg/dL — ABNORMAL LOW (ref 6–20)
Calcium, Ion: 1.06 mmol/L — ABNORMAL LOW (ref 1.15–1.40)
Calcium, Ion: 1.08 mmol/L — ABNORMAL LOW (ref 1.15–1.40)
Chloride: 113 mmol/L — ABNORMAL HIGH (ref 98–111)
Chloride: 112 mmol/L — ABNORMAL HIGH (ref 98–111)
Creatinine, Ser: 1.5 mg/dL — ABNORMAL HIGH (ref 0.44–1.00)
Creatinine, Ser: 1.5 mg/dL — ABNORMAL HIGH (ref 0.44–1.00)
Glucose, Bld: 76 mg/dL (ref 70–99)
Glucose, Bld: 77 mg/dL (ref 70–99)
HCT: 24 % — ABNORMAL LOW (ref 36.0–46.0)
HCT: 26 % — ABNORMAL LOW (ref 36.0–46.0)
Hemoglobin: 8.2 g/dL — ABNORMAL LOW (ref 12.0–15.0)
Hemoglobin: 8.8 g/dL — ABNORMAL LOW (ref 12.0–15.0)
Potassium: 3 mmol/L — ABNORMAL LOW (ref 3.5–5.1)
Potassium: 3.4 mmol/L — ABNORMAL LOW (ref 3.5–5.1)
Sodium: 149 mmol/L — ABNORMAL HIGH (ref 135–145)
Sodium: 148 mmol/L — ABNORMAL HIGH (ref 135–145)
TCO2: 23 mmol/L (ref 22–32)
TCO2: 23 mmol/L (ref 22–32)

## 2019-06-09 LAB — CBG MONITORING, ED
Glucose-Capillary: 70 mg/dL (ref 70–99)
Glucose-Capillary: 63 mg/dL — ABNORMAL LOW (ref 70–99)
Glucose-Capillary: 74 mg/dL (ref 70–99)
Glucose-Capillary: 86 mg/dL (ref 70–99)

## 2019-06-09 LAB — RAPID URINE DRUG SCREEN, HOSP PERFORMED
Amphetamines: NOT DETECTED
Barbiturates: NOT DETECTED
Benzodiazepines: NOT DETECTED
Cocaine: NOT DETECTED
Opiates: NOT DETECTED
Tetrahydrocannabinol: NOT DETECTED

## 2019-06-09 LAB — SARS CORONAVIRUS 2: SARS Coronavirus 2: NOT DETECTED

## 2019-06-09 MED ORDER — POTASSIUM CHLORIDE CRYS ER 20 MEQ PO TBCR
40.0000 meq | EXTENDED_RELEASE_TABLET | Freq: Once | ORAL | Status: AC
  Administered 2019-06-09: 40 meq via ORAL
  Filled 2019-06-09: qty 2

## 2019-06-09 NOTE — ED Notes (Signed)
Pt in bed sleeping
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
## 2019-06-09 NOTE — ED Notes (Signed)
Pt belongings inventoried and placed in locker #7.  

## 2019-06-09 NOTE — ED Notes (Signed)
Offered patient a [place] sandwich, refused.

## 2019-06-09 NOTE — ED Notes (Signed)
Pt ambulated to restroom. 

## 2019-06-09 NOTE — ED Notes (Signed)
Pt ambulated to restroom. Pt given bigger socks and ice upon request.

## 2019-06-09 NOTE — BH Assessment (Signed)
Clinician attempted to make contact with pt's husband/IVC petitioner for collateral information. There was no answer and the voicemail was not set up in which to leave a HIPAA-compliant voicemail message.  Allison [name], husband/petitioner: [phone]
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
## 2019-06-09 NOTE — ED Notes (Signed)
Allison [name] (family friend) was called and updated per patient permission. Allison [name] would like to speak with nurses and Drs. To give as much history and information in behavioral changes as she has seen over the last 6 months.  [phone]  Pt is now speaking with Allison [name] on the phone.

## 2019-06-09 NOTE — ED Notes (Signed)
Updated husband about care plan. He verbalized understanding . Husband is distraught and is asking for any and all help for his wife. He stated there are children in the home that has "seen to much as it is and this is the last straw" He is concerned the pt is not willing to get help and stay sober.   Pt spoke to husband on phone.

## 2019-06-09 NOTE — ED Notes (Signed)
Dinner tray has been ordered 

## 2019-06-09 NOTE — Progress Notes (Signed)
Per Doree Albee, pt has been accepted to [facility] bed 302-1. Accepting provider is Lindon Romp, NP. Attending provider is Dr. Parke Poisson, MD. Patient can arrive between 21:00-21:30. Number for report is [phone]. AC spoke with Eustaquio Maize, RN regarding disposition.   Chalmers Guest. Guerry Bruin, MSW, [place] Work/Disposition Phone: [phone] Fax: [phone]

## 2019-06-10 ENCOUNTER — Encounter (HOSPITAL_COMMUNITY): Payer: Self-pay | Admitting: *Deleted

## 2019-06-10 ENCOUNTER — Inpatient Hospital Stay (HOSPITAL_COMMUNITY)
Admission: AD | Admit: 2019-06-10 | Discharge: 2019-06-13 | DRG: 897 | Disposition: A | Discharge status: Home / Self Care | Payer: Federal, State, Local not specified - Other | Attending: Psychiatry | Admitting: Psychiatry

## 2019-06-10 ENCOUNTER — Other Ambulatory Visit: Payer: Self-pay

## 2019-06-10 DIAGNOSIS — Z833 Family history of diabetes mellitus: Secondary | ICD-10-CM | POA: Diagnosis not present

## 2019-06-10 DIAGNOSIS — R748 Abnormal levels of other serum enzymes: Secondary | ICD-10-CM | POA: Diagnosis present

## 2019-06-10 DIAGNOSIS — F419 Anxiety disorder, unspecified: Secondary | ICD-10-CM | POA: Diagnosis present

## 2019-06-10 DIAGNOSIS — Z79899 Other long term (current) drug therapy: Secondary | ICD-10-CM

## 2019-06-10 DIAGNOSIS — F319 Bipolar disorder, unspecified: Secondary | ICD-10-CM | POA: Diagnosis present

## 2019-06-10 DIAGNOSIS — K219 Gastro-esophageal reflux disease without esophagitis: Secondary | ICD-10-CM | POA: Diagnosis present

## 2019-06-10 DIAGNOSIS — K810 Acute cholecystitis: Secondary | ICD-10-CM | POA: Diagnosis present

## 2019-06-10 DIAGNOSIS — F1024 Alcohol dependence with alcohol-induced mood disorder: Principal | ICD-10-CM | POA: Diagnosis present

## 2019-06-10 DIAGNOSIS — E876 Hypokalemia: Secondary | ICD-10-CM | POA: Diagnosis present

## 2019-06-10 DIAGNOSIS — R001 Bradycardia, unspecified: Secondary | ICD-10-CM | POA: Diagnosis present

## 2019-06-10 DIAGNOSIS — F1290 Cannabis use, unspecified, uncomplicated: Secondary | ICD-10-CM | POA: Diagnosis present

## 2019-06-10 DIAGNOSIS — R52 Pain, unspecified: Secondary | ICD-10-CM

## 2019-06-10 DIAGNOSIS — Z811 Family history of alcohol abuse and dependence: Secondary | ICD-10-CM

## 2019-06-10 DIAGNOSIS — R45851 Suicidal ideations: Secondary | ICD-10-CM | POA: Diagnosis present

## 2019-06-10 DIAGNOSIS — F3189 Other bipolar disorder: Secondary | ICD-10-CM | POA: Diagnosis not present

## 2019-06-10 DIAGNOSIS — D509 Iron deficiency anemia, unspecified: Secondary | ICD-10-CM | POA: Diagnosis present

## 2019-06-10 DIAGNOSIS — I10 Essential (primary) hypertension: Secondary | ICD-10-CM | POA: Diagnosis present

## 2019-06-10 DIAGNOSIS — Z8249 Family history of ischemic heart disease and other diseases of the circulatory system: Secondary | ICD-10-CM | POA: Diagnosis not present

## 2019-06-10 DIAGNOSIS — F1094 Alcohol use, unspecified with alcohol-induced mood disorder: Secondary | ICD-10-CM | POA: Diagnosis not present

## 2019-06-10 DIAGNOSIS — Y908 Blood alcohol level of 240 mg/100 ml or more: Secondary | ICD-10-CM | POA: Diagnosis present

## 2019-06-10 LAB — GLUCOSE, CAPILLARY
Glucose-Capillary: 65 mg/dL — ABNORMAL LOW (ref 70–99)
Glucose-Capillary: 79 mg/dL (ref 70–99)
Glucose-Capillary: 71 mg/dL (ref 70–99)
Glucose-Capillary: 121 mg/dL — ABNORMAL HIGH (ref 70–99)

## 2019-06-10 MED ORDER — FERROUS SULFATE 325 (65 FE) MG PO TABS
325.0000 mg | ORAL_TABLET | Freq: Every day | ORAL | Status: DC
  Administered 2019-06-11 – 2019-06-13 (×3): 325 mg via ORAL
  Filled 2019-06-10: qty 1
  Filled 2019-06-10: qty 7
  Filled 2019-06-10 (×4): qty 1
  Filled 2019-06-10: qty 7

## 2019-06-10 MED ORDER — CARVEDILOL 6.25 MG PO TABS
6.2500 mg | ORAL_TABLET | Freq: Two times a day (BID) | ORAL | Status: DC
  Administered 2019-06-10 – 2019-06-11 (×2): 6.25 mg via ORAL
  Filled 2019-06-10 (×5): qty 1
  Filled 2019-06-10: qty 2

## 2019-06-10 MED ORDER — HYDROXYZINE HCL 25 MG PO TABS: 25.0000 mg | ORAL_TABLET | Freq: Four times a day (QID) | ORAL | Status: DC | PRN

## 2019-06-10 MED ORDER — ACETAMINOPHEN 325 MG PO TABS
650.0000 mg | ORAL_TABLET | Freq: Four times a day (QID) | ORAL | Status: DC | PRN
  Filled 2019-06-10: qty 2

## 2019-06-10 MED ORDER — MAGNESIUM HYDROXIDE 400 MG/5ML PO SUSP: 30.0000 mL | Freq: Every day | ORAL | Status: DC | PRN

## 2019-06-10 MED ORDER — ENSURE ENLIVE PO LIQD: 237.0000 mL | ORAL | Status: DC

## 2019-06-10 MED ORDER — HYDRALAZINE HCL 25 MG PO TABS
25.0000 mg | ORAL_TABLET | Freq: Three times a day (TID) | ORAL | Status: DC | PRN
  Administered 2019-06-10: 25 mg via ORAL
  Filled 2019-06-10: qty 1

## 2019-06-10 MED ORDER — LOSARTAN POTASSIUM 50 MG PO TABS
50.0000 mg | ORAL_TABLET | Freq: Every day | ORAL | Status: DC
  Administered 2019-06-10 – 2019-06-13 (×4): 50 mg via ORAL
  Filled 2019-06-10 (×3): qty 1
  Filled 2019-06-10: qty 7
  Filled 2019-06-10: qty 1
  Filled 2019-06-10: qty 7
  Filled 2019-06-10 (×2): qty 1

## 2019-06-10 MED ORDER — VITAMIN B-1 100 MG PO TABS
100.0000 mg | ORAL_TABLET | Freq: Every day | ORAL | Status: DC
  Administered 2019-06-11 – 2019-06-13 (×3): 100 mg via ORAL
  Filled 2019-06-10 (×5): qty 1

## 2019-06-10 MED ORDER — INSULIN ASPART 100 UNIT/ML ~~LOC~~ SOLN: 0.0000 [IU] | Freq: Three times a day (TID) | SUBCUTANEOUS | Status: DC

## 2019-06-10 MED ORDER — LOPERAMIDE HCL 2 MG PO CAPS
2.0000 mg | ORAL_CAPSULE | ORAL | Status: DC | PRN
  Administered 2019-06-10: 08:00:00 4 mg via ORAL
  Filled 2019-06-10: qty 2

## 2019-06-10 MED ORDER — ADULT MULTIVITAMIN W/MINERALS CH
1.0000 | ORAL_TABLET | Freq: Every day | ORAL | Status: DC
  Administered 2019-06-10 – 2019-06-13 (×4): 1 via ORAL
  Filled 2019-06-10 (×7): qty 1

## 2019-06-10 MED ORDER — ENSURE ENLIVE PO LIQD: 237.0000 mL | Freq: Two times a day (BID) | ORAL | Status: DC

## 2019-06-10 MED ORDER — THIAMINE HCL 100 MG/ML IJ SOLN: 100.0000 mg | Freq: Once | INTRAMUSCULAR | Status: DC

## 2019-06-10 MED ORDER — METFORMIN HCL 500 MG PO TABS
500.0000 mg | ORAL_TABLET | Freq: Two times a day (BID) | ORAL | Status: DC
  Administered 2019-06-10: 500 mg via ORAL
  Filled 2019-06-10 (×6): qty 1

## 2019-06-10 MED ORDER — LORAZEPAM 0.5 MG PO TABS
0.5000 mg | ORAL_TABLET | Freq: Four times a day (QID) | ORAL | Status: DC | PRN
  Administered 2019-06-10 – 2019-06-12 (×4): 0.5 mg via ORAL
  Filled 2019-06-10 (×4): qty 1

## 2019-06-10 MED ORDER — AMLODIPINE BESYLATE 5 MG PO TABS
5.0000 mg | ORAL_TABLET | Freq: Every day | ORAL | Status: DC
  Administered 2019-06-10 – 2019-06-11 (×2): 5 mg via ORAL
  Filled 2019-06-10 (×5): qty 1

## 2019-06-10 MED ORDER — CLONIDINE HCL 0.1 MG PO TABS
0.1000 mg | ORAL_TABLET | Freq: Three times a day (TID) | ORAL | Status: DC | PRN
  Administered 2019-06-10: 21:00:00 0.1 mg via ORAL

## 2019-06-10 MED ORDER — TRAZODONE HCL 50 MG PO TABS: 50.0000 mg | ORAL_TABLET | Freq: Every evening | ORAL | Status: DC | PRN

## 2019-06-10 MED ORDER — HYDROXYZINE HCL 25 MG PO TABS: 25.0000 mg | ORAL_TABLET | Freq: Three times a day (TID) | ORAL | Status: DC | PRN

## 2019-06-10 MED ORDER — LORAZEPAM 1 MG PO TABS: 1.0000 mg | ORAL_TABLET | Freq: Four times a day (QID) | ORAL | Status: AC | PRN

## 2019-06-10 MED ORDER — ALUM & MAG HYDROXIDE-SIMETH 200-200-20 MG/5ML PO SUSP: 30.0000 mL | ORAL | Status: DC | PRN

## 2019-06-10 MED ORDER — ONDANSETRON 4 MG PO TBDP: 4.0000 mg | ORAL_TABLET | Freq: Four times a day (QID) | ORAL | Status: DC | PRN

## 2019-06-10 NOTE — Plan of Care (Addendum)
Patient was anxious and guarded upon approach. Denies SI HI AVH. Complaining of diarrhea, medication given. Patient said she slept ok even though she got here late last night. Patient is compliant with mediations. Denies side effects. Safety is maintained with 15 minute checks as well as environmental checks. Will continue to monitor and provide support.  Problem: Education: Goal: Utilization of techniques to improve thought processes will improve Outcome: Progressing Goal: Knowledge of the prescribed therapeutic regimen will improve Outcome: Progressing   Problem: Activity: Goal: Interest or engagement in leisure activities will improve Outcome: Progressing Goal: Imbalance in normal sleep/wake cycle will improve Outcome: Progressing

## 2019-06-10 NOTE — BHH Suicide Risk Assessment (Signed)
[facility] Admission Suicide Risk Assessment   Nursing information obtained from:  Patient, Review of record Demographic factors:  Unemployed Current Mental Status:  Suicidal ideation indicated by others, Suicide plan, Self-harm thoughts Loss Factors:  NA Historical Factors:  Prior suicide attempts, Impulsivity Risk Reduction Factors:  Responsible for children under 47 years of age, Living with another person, especially a relative, Sense of responsibility to family  Total Time spent with patient: 45 minutes Principal Problem:  Alcohol Use Disorder, Alcohol Induced Mood Disorder versus MDD Diagnosis:  Alcohol Use Disorder, Alcohol Induced Mood Disorder versus MDD Subjective Data:   Continued Clinical Symptoms:  Alcohol Use Disorder Identification Test Final Score (AUDIT): 10 The "Alcohol Use Disorders Identification Test", Guidelines for Use in Primary Care, Second Edition.  World Pharmacologist [facility]). Score between 0-7:  no or low risk or alcohol related problems. Score between 8-15:  moderate risk of alcohol related problems. Score between 16-19:  high risk of alcohol related problems. Score 20 or above:  warrants further diagnostic evaluation for alcohol dependence and treatment.   CLINICAL FACTORS:  48 year old married female, presented to ED via EMS after being found intoxicated with ETOH . Admission BAL 519. Initially agitated , combative. Admitted under IVC by family reporting patient had been behaving aggressively and threatening suicide . Patient currently denies suicidal ideations and minimizes neuro-vegetative symptoms, but does present depressed, vaguely anxious. She reports history of alcohol abuse, reports she had been sober since February until about a week ago. She is not currently presenting with active withdrawal symptoms.    Psychiatric Specialty Exam: Physical Exam  ROS  Blood pressure (!) 167/93, pulse (!) 59, temperature 98.4 F (36.9 C), temperature source Oral,  resp. rate 18, height 5\' 3"  (1.6 m), weight 66.7 kg, SpO2 100 %.Body mass index is 26.04 kg/m.   see admit note MSE    COGNITIVE FEATURES THAT CONTRIBUTE TO RISK:  Closed-mindedness and Loss of executive function    SUICIDE RISK:   Moderate:  Frequent suicidal ideation with limited intensity, and duration, some specificity in terms of plans, no associated intent, good self-control, limited dysphoria/symptomatology, some risk factors present, and identifiable protective factors, including available and accessible social support.  PLAN OF CARE: Patient will be admitted to inpatient psychiatric unit for stabilization and safety. Will provide and encourage milieu participation. Provide medication management and maked adjustments as needed.  Will also provide medication management to address potential WDL symptoms. Will follow daily.    I certify that inpatient services furnished can reasonably be expected to improve the patient's condition.   Jenne Campus, MD 06/10/2019, 4:48 PM

## 2019-06-10 NOTE — Progress Notes (Signed)
I was curb sided by Dr. Parke Poisson for hypertension management on this patient.  Apparently, patient's recent blood pressures have been high in the range of 165-537 systolic.  Prior to this, they were not as high.  Patient has a history of hypertension and she takes losartan 50 mg p.o. daily and carvedilol 6.125 p.o. twice daily at home which have been resumed.  She also has bradycardia with heart rate around 50.  I was informed that patient has no symptoms of ACS such as chest pain or shortness of breath.  I recommended continuing current medications and watching carefully and adding amlodipine 5 mg p.o. daily with first dose now and also adding hydralazine 25 mg p.o. every 8 hours for systolic blood pressure more than 160 as well as clonidine 0.1 mcg p.o. every 8 hours as needed for systolic blood pressure more than 160.  Her creatinine also jumped to 1.5 yesterday.  I recommended repeating her BMP tomorrow morning.  Please do not hesitate to reach back if any further assistance is needed.

## 2019-06-10 NOTE — Progress Notes (Signed)
NUTRITION ASSESSMENT RD working remotely.   Pt identified as at risk on the Malnutrition Screen Tool  INTERVENTION: - continue Ensure Enlive but will decrease from BID to once/day, each supplement provides 350 kcal and 20 grams of protein. - continue to encourage PO intakes.    NUTRITION DIAGNOSIS: Unintentional weight loss related to sub-optimal intake as evidenced by pt report.   Goal: Pt to meet >/= 90% of their estimated nutrition needs.  Monitor:  PO intake  Assessment:  Patient admitted for depression and alcohol abuse/detox. Patient reported her husband and children are her main stressors. Ensure Enlive was ordered BID per ONS protocol.   Per chart review, current weight is 147 lb and weight on 06/12/18 was 160 lb. This indicates 13 lb weight loss (8.1% body weight) in the past 1 year; not significant for time frame. No more recent weight hx available.    48 y.o. female  Height: Ht Readings from Last 1 Encounters:  06/10/19 5\' 3"  (1.6 m)    Weight: Wt Readings from Last 1 Encounters:  06/10/19 66.7 kg    Weight Hx: Wt Readings from Last 10 Encounters:  06/10/19 66.7 kg  06/12/18 72.6 kg  05/22/16 84.3 kg  02/06/12 99.8 kg  11/14/11 117.9 kg    BMI:  Body mass index is 26.04 kg/m. Pt meets criteria for overweight status based on current BMI.  Estimated Nutritional Needs: Kcal: 25-30 kcal/kg Protein: > 1 gram protein/kg Fluid: 1 ml/kcal  Diet Order:  Diet Order            Diet regular Room service appropriate? Yes; Fluid consistency: Thin  Diet effective now             Pt is also offered choice of unit snacks mid-morning and mid-afternoon.  Pt is eating as desired.   Lab results and medications reviewed.     Jarome Matin, MS, RD, LDN, [facility] Main Inpatient Clinical Dietitian Pager # [phone] After hours/weekend pager # [phone]

## 2019-06-10 NOTE — Progress Notes (Signed)
Admission note:  Pt is a 48 year old AAF admitted to the care of Dr. Parke Poisson for alcohol abuse as well as depression.  Pt states that she is presently on a medication for depression/anxiety but feels that it isn't working.  Pt states that she has had a period of sobriety but her kids and her husband are her biggest stressors right now.  Pt states that she drank a bottle of wine bout three times this past week due to stress with daughter's graduation.  Pt denies s/s of withdrawal at this time.  Pt states that she has experienced blackouts before but not seizure activity or DTs.  Pt state that this is her first time as inpatient.  Pt states that she received her night time medication at the hospital  before coming over for admission.  Pt is cooperative with admission process.

## 2019-06-10 NOTE — Progress Notes (Signed)
Nursing Progress Note: 7p-7a D: Pt currently presents with a depressed/anxious affect and behavior. Interacting appropriately with the milieu. Pt reports good sleep during the previous night with current medication regimen.   A: Pt provided with medications per providers orders. Pt's labs and vitals were monitored throughout the night. Pt supported emotionally and encouraged to express concerns and questions. Pt educated on medications.  R: Pt's safety ensured with 15 minute and environmental checks. Pt currently denies SI, HI, and AVH. Pt verbally contracts to seek staff if SI,HI, or AVH occurs and to consult with staff before acting on any harmful thoughts. Will continue to monitor.    [place] NOVEL CORONAVIRUS (COVID-19) DAILY CHECK-OFF SYMPTOMS - answer yes or no to each - every day NO YES  Have you had a fever in the past 24 hours?  . Fever (Temp > 37.80C / 100F) X   Have you had any of these symptoms in the past 24 hours? . New Cough .  Sore Throat  .  Shortness of Breath .  Difficulty Breathing .  Unexplained Body Aches   X   Have you had any one of these symptoms in the past 24 hours not related to allergies?   . Runny Nose .  Nasal Congestion .  Sneezing   X   If you have had runny nose, nasal congestion, sneezing in the past 24 hours, has it worsened?  X   EXPOSURES - check yes or no X   Have you traveled outside the state in the past 14 days?  X   Have you been in contact with someone with a confirmed diagnosis of COVID-19 or PUI in the past 14 days without wearing appropriate PPE?  X   Have you been living in the same home as a person with confirmed diagnosis of COVID-19 or a PUI (household contact)?    X   Have you been diagnosed with COVID-19?    X              What to do next: Answered NO to all: Answered YES to anything:   Proceed with unit schedule Follow the [facility] Inpatient Flowsheet.    

## 2019-06-10 NOTE — Progress Notes (Signed)
Pt's blood sugar was 65 this morning, Pt given Gatorade, rechecked and it was 79.  Pt has remained in bed, she is a new admission that arrived around 0230 this morning.  She is a high fall risk.

## 2019-06-10 NOTE — Tx Team (Signed)
Initial Treatment Plan 06/10/2019 3:45 AM Allison [name] BPP:[id number]    PATIENT STRESSORS: Marital or family conflict Occupational concerns Substance abuse   PATIENT STRENGTHS: Average or above average intelligence Motivation for treatment/growth Supportive family/friends   PATIENT IDENTIFIED PROBLEMS: Substance abue  Depression  Suicidal Ideation          "stop thinking about drinking"  "get well and go back to work"     DISCHARGE CRITERIA:  Improved stabilization in mood, thinking, and/or behavior Need for constant or close observation no longer present Withdrawal symptoms are absent or subacute and managed without 24-hour nursing intervention  PRELIMINARY DISCHARGE PLAN: Attend 12-step recovery group Participate in family therapy Return to previous living arrangement  PATIENT/FAMILY INVOLVEMENT: This treatment plan has been presented to and reviewed with the patient, Allison [name].  The patient and family have been given the opportunity to ask questions and make suggestions.  Margaretann Loveless, RN 06/10/2019, 3:45 AM

## 2019-06-10 NOTE — H&P (Addendum)
Psychiatric Admission Assessment Adult  Patient Identification: Allison [name] MRN:  [id number] Date of Evaluation:  06/10/2019 Chief Complaint:  " my husband saw me walking down the road , said I looked like I was using drugs" Principal Diagnosis: Alcohol Use Disorder  Diagnosis:  Alcohol Use Disorder, Alcohol Induced Mood Disorder versus MDD History of Present Illness: 48 year old married female, presented to ED on 6/12 via EMS, after being found on the side of a road , intoxicated . Initially presented agitated, loud, combative. Was IVCd by her husband, reporting that patient had been behaving aggressively towards family and threatening to kill herself , attempting to jump into traffic. Patient denies any suicidal ideations, and states that " I think he was worried about me, he just wanted to get me in somewhere". She does report history of depression and anxiety, mainly earlier this year, and states she had been worried about her husband's physical health, who recently had cardiac surgery,  but states she had been feeling better over recent weeks, after husband's surgery in April and after she was started on Lexapro a few weeks ago . Denies having had any suicidal ideations prior to admission. She presents depressed, with a constricted affect. Currently denies significant neuro-vegetative symptoms of depression, and states sleep, appetite, energy level have been "OK', and denies recent anhedonia. She also denies any suicidal ideations. History of alcohol use disorder, reports she had been sober since February 2020 until last week , when she relapsed, which she attributes to daughter's high school graduation. Admission BAL 519 on June 12. Admission UDS negative. * Currently does not present with active alcohol WDL symptoms- no tremors, no diaphoresis, no restlessness, no flushing, no visual disturbances, no headache, BP 154/81, pulse 78  Associated Signs/Symptoms: Depression Symptoms:  depressed  mood, anxiety, reports she has lost weight intentionally  (Hypo) Manic Symptoms:   None noted or endorsed  Anxiety Symptoms: describes some increased anxiety recently. Psychotic Symptoms:  Denies  PTSD Symptoms: Denies  Total Time spent with patient: 45 minutes  Past Psychiatric History: reports a prior admission last year at [facility] for detoxification.  She reports history of depression, anxiety, states " mainly at the beginning of this year, before that I was more happy go lucky". States she feels she had been improving recently. Denies history of mania or hypomania. Denies history of  Denies any history of suicidal attempts or history of self injurious behaviors. Denies history of psychosis. Denies history of PTSD.  She states she was recently started on Lexapro for anxiety.    Is the patient at risk to self? Yes.    Has the patient been a risk to self in the past 6 months? No.  Has the patient been a risk to self within the distant past? No.  Is the patient a risk to others? No.  Has the patient been a risk to others in the past 6 months? No.  Has the patient been a risk to others within the distant past? No.   Prior Inpatient Therapy:  as above Prior Outpatient Therapy:  reports psychiatric medication prescribed by her PCP  Alcohol Screening: 1. How often do you have a drink containing alcohol?: 2 to 4 times a month 2. How many drinks containing alcohol do you have on a typical day when you are drinking?: 3 or 4 3. How often do you have six or more drinks on one occasion?: Less than monthly AUDIT-C Score: 4 4. How often during the last  year have you found that you were not able to stop drinking once you had started?: Never 5. How often during the last year have you failed to do what was normally expected from you becasue of drinking?: Never 6. How often during the last year have you needed a first drink in the morning to get yourself going after a heavy drinking session?:  Never 7. How often during the last year have you had a feeling of guilt of remorse after drinking?: Less than monthly 8. How often during the last year have you been unable to remember what happened the night before because you had been drinking?: Less than monthly 9. Have you or someone else been injured as a result of your drinking?: No 10. Has a relative or friend or a doctor or another health worker been concerned about your drinking or suggested you cut down?: Yes, during the last year Alcohol Use Disorder Identification Test Final Score (AUDIT): 10 Alcohol Brief Interventions/Follow-up: Brief Advice, Medication Offered/Prescribed, Alcohol Education Substance Abuse History in the last 12 months:  History of alcohol use disorder . She reports she had been a moderate drinker but that alcohol consumption had increased last year, leading for her to seek detox several months ago. States she was sober/ abstinent since February until one week ago.   Consequences of Substance Abuse: Denies history of seizures, she does report history of alcohol related blackouts, no history of DUI Previous Psychotropic Medications: Lexapro 10 mgrs daily, started about two months ago Psychological Evaluations:  No  Past Medical History: History of HTN. Denies history of DM at this time. Home medication list - Carvedilol 6.25 mgrs BID, Cozaar 50 mgrs QDAY, which she states she had been taking irregularly . Glucophage 500 mgrs BID. Patient states she does not remember ever taking this medication .    Past Medical History:  Diagnosis Date  . GERD (gastroesophageal reflux disease)   . Hypertension    History reviewed. No pertinent surgical history. Family History: parents alive, separated, has 3 half siblings  Family History  Problem Relation Age of Onset  . Hypertension Mother   . Diabetes Mother   . Hypertension Father    Family Psychiatric  History: father has history of alcohol use disorder, PTSD. No history  of suicides in family  Tobacco Screening: She does not smoke or use tobacco products Social History: 45, married, has 5 children, ranging in ages from 37 to 43. Lives with her husband and three youngest children. Self employed .  Social History   Substance and Sexual Activity  Alcohol Use Yes  . Alcohol/week: 14.0 standard drinks  . Types: 14 Standard drinks or equivalent per week   Comment: pint of liquor     Social History   Substance and Sexual Activity  Drug Use Yes  . Types: Marijuana    Additional Social History:       Allergies:   Allergies  Allergen Reactions  . Lisinopril Cough   Lab Results:  Results for orders placed or performed during the hospital encounter of 06/10/19 (from the past 48 hour(s))  Glucose, capillary     Status: Abnormal   Collection Time: 06/10/19  5:55 AM  Result Value Ref Range   Glucose-Capillary 65 (L) 70 - 99 mg/dL   Comment 1 Notify RN    Comment 2 Document in Chart   Glucose, capillary     Status: None   Collection Time: 06/10/19  6:18 AM  Result Value Ref Range  Glucose-Capillary 79 70 - 99 mg/dL  Glucose, capillary     Status: None   Collection Time: 06/10/19 12:20 PM  Result Value Ref Range   Glucose-Capillary 71 70 - 99 mg/dL    Blood Alcohol level:  Lab Results  Component Value Date   ETH 342 (HH) 06/08/2019   ETH 519 (HH) 41/32/4401    Metabolic Disorder Labs:  Lab Results  Component Value Date   HGBA1C 8.2 (H) 05/22/2016   MPG 189 05/22/2016   No results found for: PROLACTIN No results found for: CHOL, TRIG, HDL, CHOLHDL, VLDL, LDLCALC  Current Medications: Current Facility-Administered Medications  Medication Dose Route Frequency Provider Last Rate Last Dose  . acetaminophen (TYLENOL) tablet 650 mg  650 mg Oral Q6H PRN Money, Lowry Ram, FNP      . alum & mag hydroxide-simeth (MAALOX/MYLANTA) 200-200-20 MG/5ML suspension 30 mL  30 mL Oral Q4H PRN Money, Darnelle Maffucci B, FNP      . carvedilol (COREG) tablet 6.25 mg   6.25 mg Oral BID WC Money, Darnelle Maffucci B, FNP   6.25 mg at 06/10/19 0272  . feeding supplement (ENSURE ENLIVE) (ENSURE ENLIVE) liquid 237 mL  237 mL Oral Q24H Cobos, Fernando A, MD      . hydrOXYzine (ATARAX/VISTARIL) tablet 25 mg  25 mg Oral Q6H PRN Money, Lowry Ram, FNP      . hydrOXYzine (ATARAX/VISTARIL) tablet 25 mg  25 mg Oral TID PRN Money, Lowry Ram, FNP      . insulin aspart (novoLOG) injection 0-15 Units  0-15 Units Subcutaneous TID WC Money, Darnelle Maffucci B, FNP      . loperamide (IMODIUM) capsule 2-4 mg  2-4 mg Oral PRN Money, Lowry Ram, FNP   4 mg at 06/10/19 5366  . LORazepam (ATIVAN) tablet 1 mg  1 mg Oral Q6H PRN Money, Lowry Ram, FNP      . losartan (COZAAR) tablet 50 mg  50 mg Oral Daily Money, Lowry Ram, FNP   50 mg at 06/10/19 4403  . magnesium hydroxide (MILK OF MAGNESIA) suspension 30 mL  30 mL Oral Daily PRN Money, Darnelle Maffucci B, FNP      . metFORMIN (GLUCOPHAGE) tablet 500 mg  500 mg Oral BID WC Money, Lowry Ram, FNP   500 mg at 06/10/19 4742  . multivitamin with minerals tablet 1 tablet  1 tablet Oral Daily Money, Lowry Ram, FNP   1 tablet at 06/10/19 5956  . ondansetron (ZOFRAN-ODT) disintegrating tablet 4 mg  4 mg Oral Q6H PRN Money, Darnelle Maffucci B, FNP      . thiamine (B-1) injection 100 mg  100 mg Intramuscular Once Money, Lowry Ram, FNP      . [START ON 06/11/2019] thiamine (VITAMIN B-1) tablet 100 mg  100 mg Oral Daily Money, Lowry Ram, FNP      . traZODone (DESYREL) tablet 50 mg  50 mg Oral QHS PRN Money, Lowry Ram, FNP       PTA Medications: Medications Prior to Admission  Medication Sig Dispense Refill Last Dose  . blood glucose meter kit and supplies KIT Dispense based on patient and insurance preference. Use up to four times daily as directed. (FOR ICD-9 250.00, 250.01). (Patient not taking: Reported on 08/19/2017) 1 each 0   . carvedilol (COREG) 6.25 MG tablet Take 1 tablet (6.25 mg total) by mouth 2 (two) times daily with a meal. (Patient not taking: Reported on 08/19/2017) 60 tablet 0   .  chlordiazePOXIDE (LIBRIUM) 25 MG capsule 27m PO TID x  1D, then 25-28m PO BID X 1D, then 25-514mPO QD X 1D (Patient not taking: Reported on 06/09/2019) 10 capsule 0   . escitalopram (LEXAPRO) 10 MG tablet Take 10 mg by mouth every evening.     . ferrous sulfate 325 (65 FE) MG tablet Take 1 tablet (325 mg total) by mouth daily with breakfast. 30 tablet 0   . folic acid (FOLVITE) 1 MG tablet Take 1 mg by mouth daily.     . Marland Kitchenosartan (COZAAR) 50 MG tablet Take 1 tablet (50 mg total) by mouth daily. (Patient not taking: Reported on 03/01/2018) 30 tablet 0   . metFORMIN (GLUCOPHAGE) 500 MG tablet Take 1 tablet (500 mg total) by mouth 2 (two) times daily with a meal. (Patient not taking: Reported on 03/01/2018) 60 tablet 0   . ondansetron (ZOFRAN) 4 MG tablet Take 1 tablet (4 mg total) by mouth every 6 (six) hours. (Patient not taking: Reported on 03/01/2018) 10 tablet 0   . thiamine (VITAMIN B-1) 100 MG tablet Take 100 mg by mouth daily.       Musculoskeletal: Strength & Muscle Tone: within normal limits Gait & Station: normal Patient leans: N/A  Psychiatric Specialty Exam: Physical Exam  Review of Systems  Constitutional: Negative for chills and fever.  HENT: Negative.   Eyes: Negative.   Respiratory: Negative for cough and shortness of breath.   Cardiovascular: Negative for chest pain.  Gastrointestinal: Negative for nausea and vomiting.  Genitourinary: Negative.   Musculoskeletal: Negative.   Skin: Negative for rash.  Neurological: Negative for seizures and headaches.  Endo/Heme/Allergies: Negative.   Psychiatric/Behavioral: Positive for depression, substance abuse and suicidal ideas.  All other systems reviewed and are negative.   Blood pressure (!) 167/93, pulse (!) 59, temperature 98.4 F (36.9 C), temperature source Oral, resp. rate 18, height '5\' 3"'  (1.6 m), weight 66.7 kg, SpO2 100 %.Body mass index is 26.04 kg/m.  General Appearance: Fairly Groomed  Eye Contact:  Fair  Speech:   Normal Rate  Volume:  Decreased  Mood:  reports mood is " all right", presents depressed   Affect:  Constricted and vaguely anxious   Thought Process:  Linear and Descriptions of Associations: Intact  Orientation:  Full (Time, Place, and Person)  Thought Content:  denies hallucinations, no delusions expressed, does not appear internally preoccupied   Suicidal Thoughts:  No denies suicidal or self injurious ideations at this time, contracts for safety on unit. Denies any homicidal or violent ideations.  Homicidal Thoughts:  No  Memory:  recent and remote fair   Judgement:  Fair  Insight:  Fair  Psychomotor Activity:  Decreased- no current restlessness or psychomotor agitation, no distal tremors or diaphoresis.  Concentration:  Concentration: Good and Attention Span: Good  Recall:  Good  Fund of Knowledge:  Good  Language:  Good  Akathisia:  Negative  Handed:  Right  AIMS (if indicated):     Assets:  Communication Skills Desire for Improvement Social Support  ADL's:  Intact  Cognition:  WNL  Sleep:  Number of Hours: 1.75    Treatment Plan Summary: Daily contact with patient to assess and evaluate symptoms and progress in treatment, Medication management, Plan inpatient treatment  and medications as below  Observation Level/Precautions:  15 minute checks  Laboratory:  as needed , TSH, Lipid Panel, EKG . HgbA1C.  BMP in AM .  Psychotherapy:  Milieu, group therapy   Medications:   Reports she had been started on Lexapro 10 mgrs  QDAY a few weeks ago, with good response and tolerance. Of note, however, patient's QTc is prolonged at 509, so at this time will not yet  resume this medication. Will discontinue Vitaril PRN/Trazodone PRN/ Zofran PRNs as these may cause QTc prolongation.    Will  not continue Metformin at this time, patient does not currently endorse DM, states she has not taken this medication " for a long time"and CBGs have been on low normal range  ( today 65,  79)  Ativan PRN for alcohol WDL , per CIWA score as needed   Resume FeSO4 supplementation   Consultations:  Have consulted with hospitalist , Dr Doristine Bosworth, with regards to antihypertensive management.  Patient presents with HTN .  At 5, 30 PM BP 182/90, pulse 52.  Currently denies headache, chest pain or other associated symptoms at this time.  I do not think HTN is likely to be related to alcohol WDL at present ,as presents without symptoms of withdrawal( no tremors, no diaphoresis, no restlessness)  and with bradycardia.  Recommendation from hospitalist is to add Amlodipine 5 mgrs QDAY and Hydralazine 25 mgrs Q 8 hours PRN for systolic >800 as needed .  Discharge Concerns:  -   Estimated LOS: 4-5 days   Other:     Physician Treatment Plan for Primary Diagnosis:  Alcohol Use Disorder Long Term Goal(s): Improvement in symptoms so as ready for discharge  Short Term Goals: Ability to identify changes in lifestyle to reduce recurrence of condition will improve and Ability to identify triggers associated with substance abuse/mental health issues will improve  Physician Treatment Plan for Secondary Diagnosis: Alcohol Use Disorder versus MDD Long Term Goal(s): Improvement in symptoms so as ready for discharge  Short Term Goals: Ability to identify changes in lifestyle to reduce recurrence of condition will improve, Ability to verbalize feelings will improve, Ability to disclose and discuss suicidal ideas, Ability to demonstrate self-control will improve, Ability to identify and develop effective coping behaviors will improve and Ability to maintain clinical measurements within normal limits will improve  I certify that inpatient services furnished can reasonably be expected to improve the patient's condition.    Jenne Campus, MD 6/15/20202:58 PM

## 2019-06-10 NOTE — Progress Notes (Signed)
Recreation Therapy Notes  Date:  6.15.20 Time: 0930 Location: [address] Dayroom  Group Topic: Stress Management  Goal Area(s) Addresses:  Patient will identify positive stress management techniques. Patient will identify benefits of using stress management post d/c.  Intervention: Stress Management  Activity :  Meditation.  LRT introduced the stress management technique of meditation.  LRT played a meditation of making the most of your day.  Patients were to listen and follow along as meditation played to engage in activity.  Education:  Stress Management, Discharge Planning.   Education Outcome: Acknowledges Education  Clinical Observations/Feedback:  Pt did not attend group session.    Victorino Sparrow, LRT/CTRS         Ria Comment, Tamarcus Condie A 06/10/2019 11:13 AM

## 2019-06-10 NOTE — Tx Team (Signed)
Interdisciplinary Treatment and Diagnostic Plan Update  06/10/2019 Time of Session: 9:00am Allison [name] MRN: [id number]  Principal Diagnosis: <principal problem not specified>  Secondary Diagnoses: Active Problems:   Bipolar disorder, unspecified ([place])   Current Medications:  Current Facility-Administered Medications  Medication Dose Route Frequency Provider Last Rate Last Dose  . acetaminophen (TYLENOL) tablet 650 mg  650 mg Oral Q6H PRN Money, Lowry Ram, FNP      . alum & mag hydroxide-simeth (MAALOX/MYLANTA) 200-200-20 MG/5ML suspension 30 mL  30 mL Oral Q4H PRN Money, Darnelle Maffucci B, FNP      . carvedilol (COREG) tablet 6.25 mg  6.25 mg Oral BID WC Money, Darnelle Maffucci B, FNP   6.25 mg at 06/10/19 9169  . feeding supplement (ENSURE ENLIVE) (ENSURE ENLIVE) liquid 237 mL  237 mL Oral Q24H [name], Allison A, MD      . hydrOXYzine (ATARAX/VISTARIL) tablet 25 mg  25 mg Oral Q6H PRN Money, Lowry Ram, FNP      . hydrOXYzine (ATARAX/VISTARIL) tablet 25 mg  25 mg Oral TID PRN Money, Lowry Ram, FNP      . insulin aspart (novoLOG) injection 0-15 Units  0-15 Units Subcutaneous TID WC Money, Darnelle Maffucci B, FNP      . loperamide (IMODIUM) capsule 2-4 mg  2-4 mg Oral PRN Money, Lowry Ram, FNP   4 mg at 06/10/19 4503  . LORazepam (ATIVAN) tablet 1 mg  1 mg Oral Q6H PRN Money, Lowry Ram, FNP      . losartan (COZAAR) tablet 50 mg  50 mg Oral Daily Money, Lowry Ram, FNP   50 mg at 06/10/19 8882  . magnesium hydroxide (MILK OF MAGNESIA) suspension 30 mL  30 mL Oral Daily PRN Money, Darnelle Maffucci B, FNP      . metFORMIN (GLUCOPHAGE) tablet 500 mg  500 mg Oral BID WC Money, Lowry Ram, FNP   500 mg at 06/10/19 8003  . multivitamin with minerals tablet 1 tablet  1 tablet Oral Daily Money, Lowry Ram, FNP   1 tablet at 06/10/19 4917  . ondansetron (ZOFRAN-ODT) disintegrating tablet 4 mg  4 mg Oral Q6H PRN Money, Darnelle Maffucci B, FNP      . thiamine (B-1) injection 100 mg  100 mg Intramuscular Once Money, Lowry Ram, FNP      . [START ON  06/11/2019] thiamine (VITAMIN B-1) tablet 100 mg  100 mg Oral Daily Money, Lowry Ram, FNP      . traZODone (DESYREL) tablet 50 mg  50 mg Oral QHS PRN Money, Lowry Ram, FNP       PTA Medications: Medications Prior to Admission  Medication Sig Dispense Refill Last Dose  . blood glucose meter kit and supplies KIT Dispense based on patient and insurance preference. Use up to four times daily as directed. (FOR ICD-9 250.00, 250.01). (Patient not taking: Reported on 08/19/2017) 1 each 0   . carvedilol (COREG) 6.25 MG tablet Take 1 tablet (6.25 mg total) by mouth 2 (two) times daily with a meal. (Patient not taking: Reported on 08/19/2017) 60 tablet 0   . chlordiazePOXIDE (LIBRIUM) 25 MG capsule 56m PO TID x 1D, then 25-557mPO BID X 1D, then 25-5072mO QD X 1D (Patient not taking: Reported on 06/09/2019) 10 capsule 0   . escitalopram (LEXAPRO) 10 MG tablet Take 10 mg by mouth every evening.     . ferrous sulfate 325 (65 FE) MG tablet Take 1 tablet (325 mg total) by mouth daily with breakfast. 30 tablet 0   .  folic acid (FOLVITE) 1 MG tablet Take 1 mg by mouth daily.     [name] losartan (COZAAR) 50 MG tablet Take 1 tablet (50 mg total) by mouth daily. (Patient not taking: Reported on 03/01/2018) 30 tablet 0   . metFORMIN (GLUCOPHAGE) 500 MG tablet Take 1 tablet (500 mg total) by mouth 2 (two) times daily with a meal. (Patient not taking: Reported on 03/01/2018) 60 tablet 0   . ondansetron (ZOFRAN) 4 MG tablet Take 1 tablet (4 mg total) by mouth every 6 (six) hours. (Patient not taking: Reported on 03/01/2018) 10 tablet 0   . thiamine (VITAMIN B-1) 100 MG tablet Take 100 mg by mouth daily.       Patient Stressors: Marital or family conflict Occupational concerns Substance abuse  Patient Strengths: Average or above average intelligence Motivation for treatment/growth Supportive family/friends  Treatment Modalities: Medication Management, Group therapy, Case management,  1 to 1 session with clinician,  Psychoeducation, Recreational therapy.   Physician Treatment Plan for Primary Diagnosis: <principal problem not specified> Long Term Goal(s):     Short Term Goals:    Medication Management: Evaluate patient's response, side effects, and tolerance of medication regimen.  Therapeutic Interventions: 1 to 1 sessions, Unit Group sessions and Medication administration.  Evaluation of Outcomes: Not Met  Physician Treatment Plan for Secondary Diagnosis: Active Problems:   Bipolar disorder, unspecified ([place])  Long Term Goal(s):     Short Term Goals:       Medication Management: Evaluate patient's response, side effects, and tolerance of medication regimen.  Therapeutic Interventions: 1 to 1 sessions, Unit Group sessions and Medication administration.  Evaluation of Outcomes: Not Met   RN Treatment Plan for Primary Diagnosis: <principal problem not specified> Long Term Goal(s): Knowledge of disease and therapeutic regimen to maintain health will improve  Short Term Goals: Ability to verbalize feelings will improve, Ability to identify and develop effective coping behaviors will improve and Compliance with prescribed medications will improve  Medication Management: RN will administer medications as ordered by provider, will assess and evaluate patient's response and provide education to patient for prescribed medication. RN will report any adverse and/or side effects to prescribing provider.  Therapeutic Interventions: 1 on 1 counseling sessions, Psychoeducation, Medication administration, Evaluate responses to treatment, Monitor vital signs and CBGs as ordered, Perform/monitor CIWA, COWS, AIMS and Fall Risk screenings as ordered, Perform wound care treatments as ordered.  Evaluation of Outcomes: Not Met   LCSW Treatment Plan for Primary Diagnosis: <principal problem not specified> Long Term Goal(s): Safe transition to appropriate next level of care at discharge, Engage patient in  therapeutic group addressing interpersonal concerns.  Short Term Goals: Engage patient in aftercare planning with referrals and resources, Increase social support, Identify triggers associated with mental health/substance abuse issues and Increase skills for wellness and recovery  Therapeutic Interventions: Assess for all discharge needs, 1 to 1 time with Social worker, Explore available resources and support systems, Assess for adequacy in community support network, Educate family and significant other(s) on suicide prevention, Complete Psychosocial Assessment, Interpersonal group therapy.  Evaluation of Outcomes: Not Met   Progress in Treatment: Attending groups: No. New to unit. Participating in groups: No. Taking medication as prescribed: Yes. Toleration medication: Yes. Family/Significant other contact made: No, will contact:  supports if consents are granted. Patient understands diagnosis: No. Discussing patient identified problems/goals with staff: Yes. Medical problems stabilized or resolved: Yes. Denies suicidal/homicidal ideation: Yes. Issues/concerns per patient self-inventory: No.  New problem(s) identified: No, Describe:  CSW  continuing to assess  New Short Term/Long Term Goal(s): detox, medication management for mood stabilization; elimination of SI thoughts; development of comprehensive mental wellness/sobriety plan.  Patient Goals:    Discharge Plan or Barriers: CSW continuing to assess for appropriate referrals.  Reason for Continuation of Hospitalization: Anxiety Depression Withdrawal symptoms  Estimated Length of Stay: 3-5 days  Attendees: Patient:  06/10/2019 10:09 AM  Physician:  06/10/2019 10:09 AM  Nursing:  06/10/2019 10:09 AM  RN Care Manager: 06/10/2019 10:09 AM  Social Worker: Stephanie Acre, [place] 06/10/2019 10:09 AM  Recreational Therapist:  06/10/2019 10:09 AM  Other:  06/10/2019 10:09 AM  Other:  06/10/2019 10:09 AM  Other: 06/10/2019 10:09 AM     Scribe for Treatment Team: Joellen Jersey, [place] 06/10/2019 10:09 AM

## 2019-06-10 NOTE — ED Notes (Addendum)
[employer] called for transport. Previous RN gave report to [facility]. Allison [name] at [facility] states that she is ready for her to be transported.

## 2019-06-10 NOTE — ED Notes (Signed)
Patient transported to [facility] health unit with [employer] officers .

## 2019-06-11 ENCOUNTER — Inpatient Hospital Stay (HOSPITAL_COMMUNITY): Payer: Federal, State, Local not specified - Other

## 2019-06-11 DIAGNOSIS — F1094 Alcohol use, unspecified with alcohol-induced mood disorder: Secondary | ICD-10-CM

## 2019-06-11 DIAGNOSIS — F3189 Other bipolar disorder: Secondary | ICD-10-CM

## 2019-06-11 LAB — LIPID PANEL
Cholesterol: 173 mg/dL (ref 0–200)
HDL: 41 mg/dL (ref >40)
Total CHOL/HDL Ratio: 4.2 RATIO
Triglycerides: 61 mg/dL (ref <150)
VLDL: 12 mg/dL (ref 0–40)

## 2019-06-11 LAB — BASIC METABOLIC PANEL
Anion gap: 9 (ref 5–15)
BUN: 6 mg/dL (ref 6–20)
CO2: 23 mmol/L (ref 22–32)
Calcium: 7.6 mg/dL — ABNORMAL LOW (ref 8.9–10.3)
Chloride: 109 mmol/L (ref 98–111)
Creatinine, Ser: 2.09 mg/dL — ABNORMAL HIGH (ref 0.44–1.00)
GFR calc Af Amer: 32 mL/min — ABNORMAL LOW (ref >60)
GFR calc non Af Amer: 28 mL/min — ABNORMAL LOW (ref >60)
Glucose, Bld: 71 mg/dL (ref 70–99)
Potassium: 2.8 mmol/L — ABNORMAL LOW (ref 3.5–5.1)
Sodium: 141 mmol/L (ref 135–145)

## 2019-06-11 LAB — HEMOGLOBIN A1C
Hgb A1c MFr Bld: 4.4 % — ABNORMAL LOW (ref 4.8–5.6)
Mean Plasma Glucose: 79.58 mg/dL

## 2019-06-11 LAB — TSH: TSH: 4.123 u[IU]/mL (ref 0.350–4.500)

## 2019-06-11 MED ORDER — CARVEDILOL 12.5 MG PO TABS
12.5000 mg | ORAL_TABLET | Freq: Two times a day (BID) | ORAL | Status: DC
  Administered 2019-06-11 – 2019-06-13 (×4): 12.5 mg via ORAL
  Filled 2019-06-11: qty 14
  Filled 2019-06-11: qty 1
  Filled 2019-06-11: qty 14
  Filled 2019-06-11 (×2): qty 1
  Filled 2019-06-11: qty 14
  Filled 2019-06-11 (×2): qty 1
  Filled 2019-06-11: qty 14
  Filled 2019-06-11: qty 1

## 2019-06-11 MED ORDER — FOLIC ACID 1 MG PO TABS
1.0000 mg | ORAL_TABLET | Freq: Every day | ORAL | Status: DC
  Administered 2019-06-11 – 2019-06-13 (×3): 1 mg via ORAL
  Filled 2019-06-11 (×5): qty 1

## 2019-06-11 MED ORDER — PANTOPRAZOLE SODIUM 40 MG PO TBEC
40.0000 mg | DELAYED_RELEASE_TABLET | Freq: Every day | ORAL | Status: DC
  Administered 2019-06-11 – 2019-06-13 (×3): 40 mg via ORAL
  Filled 2019-06-11 (×5): qty 1

## 2019-06-11 MED ORDER — POTASSIUM CHLORIDE CRYS ER 20 MEQ PO TBCR
20.0000 meq | EXTENDED_RELEASE_TABLET | Freq: Once | ORAL | Status: AC
  Administered 2019-06-11: 20 meq via ORAL
  Filled 2019-06-11: qty 1
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
## 2019-06-11 NOTE — Plan of Care (Addendum)
Patient was pleasant upon approach this morning. Denies any SO HO AVH. Denies physical pain. Patient's lower extrimeties are both swollen, pitting edema +2. BP is decreasing, HR is increasing. Patient denies any SOB or pain. Temp is WNL. MD notified of lab results, patient will be sent across the lab for an abdominal ultrasound to rule out gall stones. Patient is compliant with medications. Denies any side effects. Safety is maintained with 15 minute checks as well as environmental checks. Will continue to monitor and assess throughout the shift.  Problem: Education: Goal: Utilization of techniques to improve thought processes will improve Outcome: Progressing Goal: Knowledge of the prescribed therapeutic regimen will improve Outcome: Progressing   Problem: Activity: Goal: Interest or engagement in leisure activities will improve Outcome: Progressing

## 2019-06-11 NOTE — Progress Notes (Signed)
Nursing Progress Note: 7p-7a D: Pt currently presents with a depressed/anxious affect and behavior. Interacting appropriately with the milieu. Pt reports good sleep during the previous night with current medication regimen.   A: Pt provided with medications per providers orders. Pt's labs and vitals were monitored throughout the night. Pt supported emotionally and encouraged to express concerns and questions. Pt educated on medications.  R: Pt's safety ensured with 15 minute and environmental checks. Pt currently denies SI, HI, and AVH. Pt verbally contracts to seek staff if SI,HI, or AVH occurs and to consult with staff before acting on any harmful thoughts. Will continue to monitor.    [facility] NOVEL CORONAVIRUS (COVID-19) DAILY CHECK-OFF SYMPTOMS - answer yes or no to each - every day NO YES  Have you had a fever in the past 24 hours?  . Fever (Temp > 37.80C / 100F) X   Have you had any of these symptoms in the past 24 hours? . New Cough .  Sore Throat  .  Shortness of Breath .  Difficulty Breathing .  Unexplained Body Aches   X   Have you had any one of these symptoms in the past 24 hours not related to allergies?   . Runny Nose .  Nasal Congestion .  Sneezing   X   If you have had runny nose, nasal congestion, sneezing in the past 24 hours, has it worsened?  X   EXPOSURES - check yes or no X   Have you traveled outside the state in the past 14 days?  X   Have you been in contact with someone with a confirmed diagnosis of COVID-19 or PUI in the past 14 days without wearing appropriate PPE?  X   Have you been living in the same home as a person with confirmed diagnosis of COVID-19 or a PUI (household contact)?    X   Have you been diagnosed with COVID-19?    X              What to do next: Answered NO to all: Answered YES to anything:   Proceed with unit schedule Follow the [facility] Inpatient Flowsheet.

## 2019-06-11 NOTE — Plan of Care (Signed)
  Problem: Education: Goal: Utilization of techniques to improve thought processes will improve 06/11/2019 1016 by Megan Mans, RN Outcome: Progressing 06/11/2019 0902 by Megan Mans, RN Outcome: Progressing Goal: Knowledge of the prescribed therapeutic regimen will improve 06/11/2019 1016 by Megan Mans, RN Outcome: Progressing 06/11/2019 0902 by Megan Mans, RN Outcome: Progressing   Problem: Activity: Goal: Interest or engagement in leisure activities will improve 06/11/2019 1016 by Megan Mans, RN Outcome: Progressing 06/11/2019 0902 by Megan Mans, RN Outcome: Progressing Goal: Imbalance in normal sleep/wake cycle will improve Outcome: Progressing   Problem: Coping: Goal: Coping ability will improve Outcome: Progressing

## 2019-06-11 NOTE — Progress Notes (Signed)
[facility] MD Progress Note  06/11/2019 12:43 PM Allison [name]  MRN:  [id number] Subjective: Patient is a 48 year old female with a past psychiatric history significant for alcohol use disorder, alcohol induced mood disorder and alcohol dependence who was admitted on 06/10/2019 with aggressive threatening behavior to kill her self and continued alcohol abuse.  Objective: Patient is seen and examined.  Patient is a 48 year old female with the above-stated past psychiatric history who was placed under involuntary commitment by her husband and admitted on 06/10/2019.  She was placed on lorazepam 1 mg p.o. every 6 hours PRN a CIWA greater than 10.  She had blood pressure medications continued and the hospitalist recommended the addition of hydralazine for her blood pressure.  She had been treated with losartan and carvedilol as an outpatient.  Her laboratories returned and showed an increased alkaline phosphatase, liver function enzymes, and anemia.  Her hematocrit numbers had actually increased from when she had been admitted.  She denied any suicidal ideation today.  She denied any previous history of gallbladder or liver problems.  Her blood alcohol on admission was she was unable to quantify how much alcohol she had been drinking recently.  She stated she had had a period of sobriety prior to the 16th.  Her mood was stable at this point.  Review of the electronic medical record did not show any previous imaging on her liver or gallbladder.  Her blood pressure remains mildly elevated at 141/78, pulse is 75 and she is afebrile.  Her last CIWA was only 1 this a.m.  She slept 6.25 hours last night.  Principal Problem: <principal problem not specified> Diagnosis: Active Problems:   Bipolar disorder, unspecified ([place])  Total Time spent with patient: 20 minutes  Past Psychiatric History: The admission H&P  Past Medical History:  Past Medical History:  Diagnosis Date  . GERD (gastroesophageal reflux disease)    . Hypertension    History reviewed. No pertinent surgical history. Family History:  Family History  Problem Relation Age of Onset  . Hypertension Mother   . Diabetes Mother   . Hypertension Father    Family Psychiatric  History: See admission H&P Social History:  Social History   Substance and Sexual Activity  Alcohol Use Yes  . Alcohol/week: 14.0 standard drinks  . Types: 14 Standard drinks or equivalent per week   Comment: pint of liquor     Social History   Substance and Sexual Activity  Drug Use Yes  . Types: Marijuana    Social History   Socioeconomic History  . Marital status: Married    Spouse name: Not on file  . Number of children: Not on file  . Years of education: Not on file  . Highest education level: Not on file  Occupational History  . Not on file  Social Needs  . Financial resource strain: Patient refused  . Food insecurity    Worry: Patient refused    Inability: Patient refused  . Transportation needs    Medical: Patient refused    Non-medical: Patient refused  Tobacco Use  . Smoking status: Never Smoker  . Smokeless tobacco: Never Used  Substance and Sexual Activity  . Alcohol use: Yes    Alcohol/week: 14.0 standard drinks    Types: 14 Standard drinks or equivalent per week    Comment: pint of liquor  . Drug use: Yes    Types: Marijuana  . Sexual activity: Yes    Birth control/protection: Implant  Lifestyle  . Physical  activity    Days per week: Patient refused    Minutes per session: Patient refused  . Stress: Patient refused  Relationships  . Social Musician on phone: Patient refused    Gets together: Patient refused    Attends religious service: Patient refused    Active member of club or organization: Patient refused    Attends meetings of clubs or organizations: Patient refused    Relationship status: Patient refused  Other Topics Concern  . Not on file  Social History Narrative  . Not on file   Additional  Social History:                         Sleep: Good  Appetite:  Fair  Current Medications: Current Facility-Administered Medications  Medication Dose Route Frequency Provider Last Rate Last Dose  . acetaminophen (TYLENOL) tablet 650 mg  650 mg Oral Q6H PRN Money, Gerlene Burdock, FNP      . alum & mag hydroxide-simeth (MAALOX/MYLANTA) 200-200-20 MG/5ML suspension 30 mL  30 mL Oral Q4H PRN Money, Feliz Beam B, FNP      . carvedilol (COREG) tablet 12.5 mg  12.5 mg Oral BID WC Antonieta Pert, MD      . cloNIDine (CATAPRES) tablet 0.1 mg  0.1 mg Oral Q8H PRN Hughie Closs, MD   0.1 mg at 06/10/19 2119  . feeding supplement (ENSURE ENLIVE) (ENSURE ENLIVE) liquid 237 mL  237 mL Oral Q24H Cobos, Fernando A, MD      . ferrous sulfate tablet 325 mg  325 mg Oral Q breakfast Cobos, Rockey Situ, MD   325 mg at 06/11/19 0802  . folic acid (FOLVITE) tablet 1 mg  1 mg Oral Daily Antonieta Pert, MD   1 mg at 06/11/19 1205  . LORazepam (ATIVAN) tablet 0.5 mg  0.5 mg Oral Q6H PRN Cobos, Rockey Situ, MD   0.5 mg at 06/10/19 2119  . LORazepam (ATIVAN) tablet 1 mg  1 mg Oral Q6H PRN Money, Gerlene Burdock, FNP      . losartan (COZAAR) tablet 50 mg  50 mg Oral Daily Money, Gerlene Burdock, FNP   50 mg at 06/11/19 0802  . magnesium hydroxide (MILK OF MAGNESIA) suspension 30 mL  30 mL Oral Daily PRN Money, Gerlene Burdock, FNP      . multivitamin with minerals tablet 1 tablet  1 tablet Oral Daily Money, Gerlene Burdock, FNP   1 tablet at 06/11/19 0802  . thiamine (B-1) injection 100 mg  100 mg Intramuscular Once Money, Feliz Beam B, FNP      . thiamine (VITAMIN B-1) tablet 100 mg  100 mg Oral Daily Money, Gerlene Burdock, FNP   100 mg at 06/11/19 1610    Lab Results:  Results for orders placed or performed during the hospital encounter of 06/10/19 (from the past 48 hour(s))  Glucose, capillary     Status: Abnormal   Collection Time: 06/10/19  5:55 AM  Result Value Ref Range   Glucose-Capillary 65 (L) 70 - 99 mg/dL   Comment 1 Notify RN     Comment 2 Document in Chart   Glucose, capillary     Status: None   Collection Time: 06/10/19  6:18 AM  Result Value Ref Range   Glucose-Capillary 79 70 - 99 mg/dL  Glucose, capillary     Status: None   Collection Time: 06/10/19 12:20 PM  Result Value Ref Range   Glucose-Capillary 71 70 -  99 mg/dL  Glucose, capillary     Status: Abnormal   Collection Time: 06/10/19  8:11 PM  Result Value Ref Range   Glucose-Capillary 121 (H) 70 - 99 mg/dL  Hemoglobin [id number]     Status: Abnormal   Collection Time: 06/11/19  6:17 AM  Result Value Ref Range   Hgb A1c MFr Bld 4.4 (L) 4.8 - 5.6 %    Comment: (NOTE) Pre diabetes:          5.7%-6.4% Diabetes:              >6.4% Glycemic control for   <7.0% adults with diabetes    Mean Plasma Glucose 79.58 mg/dL    Comment: Performed at [facility], [address] [place], [place] [address]  TSH     Status: None   Collection Time: 06/11/19  6:17 AM  Result Value Ref Range   TSH 4.123 0.350 - 4.500 uIU/mL    Comment: Performed by a 3rd Generation assay with a functional sensitivity of <=0.01 uIU/mL. Performed at [facility], [address] [place], [place] [address]   Lipid panel     Status: None   Collection Time: 06/11/19  6:17 AM  Result Value Ref Range   Cholesterol 173 0 - 200 mg/dL    Comment: Performed at [facility], [address] [place], [place] [address]   Triglycerides 61 <150 mg/dL    Comment: Performed at [facility], [address] [place], [place] [address]   HDL 41 >40 mg/dL    Comment: Performed at [facility], [address] [place], [place] [address]   Total CHOL/HDL Ratio 4.2 RATIO    Comment: Performed at [facility], [address] [place], [place] [address]   VLDL 12 0 - 40 mg/dL    Comment: Performed at [facility], [address] [place], [place] [address]   LDL Cholesterol NOT CALCULATED 0 - 99 mg/dL    Comment: Performed at [facility], [address] [place], [place] [address]  Basic metabolic panel     Status: Abnormal   Collection Time: 06/11/19  6:17 AM  Result Value Ref Range   Sodium 141 135 - 145 mmol/L   Potassium 2.8 (L) 3.5 - 5.1 mmol/L   Chloride 109 98 - 111 mmol/L   CO2 23 22 - 32 mmol/L   Glucose, Bld 71 70 - 99 mg/dL   BUN 6 6 - 20 mg/dL   Creatinine, Ser 1.322.09 (H) 0.44 - 1.00 mg/dL   Calcium 7.6 (L) 8.9 - 10.3 mg/dL   GFR calc non Af Amer 28 (L) >60 mL/min   GFR calc Af Amer 32 (L) >60 mL/min   Anion gap 9 5 - 15    Comment: Performed at [facility], [address] [place], [place] [address]    Blood Alcohol level:  Lab Results  Component Value Date   ETH 342 [facility](HH) 06/08/2019   ETH 519 (HH) 06/07/2019    Metabolic Disorder Labs: Lab Results  Component Value Date   HGBA1C 4.4 (L) 06/11/2019   MPG 79.58 06/11/2019   MPG 189 05/22/2016   No results found for: PROLACTIN Lab Results  Component Value Date   CHOL 173 06/11/2019   TRIG 61 06/11/2019   HDL 41 06/11/2019   CHOLHDL 4.2 06/11/2019   VLDL 12 06/11/2019   LDLCALC NOT CALCULATED 06/11/2019  Physical Findings: AIMS: Facial and Oral Movements Muscles of Facial Expression: None, normal Lips and Perioral Area: None, normal Jaw: None, normal Tongue: None, normal,Extremity Movements Upper (arms, wrists, hands, fingers): None, normal Lower (legs, knees, ankles, toes): None, normal, Trunk Movements Neck, shoulders, hips: None, normal, Overall Severity Severity of abnormal movements (highest score from questions above): None, normal Incapacitation due to abnormal movements: None, normal Patient's awareness of abnormal movements (rate only patient's report): No Awareness, Dental Status Current problems with teeth and/or dentures?: No Does patient usually wear dentures?: No  CIWA:  CIWA-Ar Total: 1 COWS:     Musculoskeletal: Strength & Muscle Tone: within normal limits Gait & Station:  normal Patient leans: N/A  Psychiatric Specialty Exam: Physical Exam  Constitutional: She is oriented to person, place, and time. She appears well-developed and well-nourished.  HENT:  Head: Normocephalic and atraumatic.  Respiratory: Effort normal.  Neurological: She is alert and oriented to person, place, and time.    ROS  Blood pressure (!) 141/78, pulse 75, temperature 98.3 F (36.8 C), resp. rate 20, height 5\' 3"  (1.6 m), weight 66.7 kg, SpO2 100 %.Body mass index is 26.04 kg/m.  General Appearance: Disheveled  Eye Contact:  Fair  Speech:  Normal Rate  Volume:  Normal  Mood:  Anxious  Affect:  Congruent  Thought Process:  Coherent and Descriptions of Associations: Circumstantial  Orientation:  Full (Time, Place, and Person)  Thought Content:  Logical  Suicidal Thoughts:  No  Homicidal Thoughts:  No  Memory:  Immediate;   Fair Recent;   Fair Remote;   Fair  Judgement:  Fair  Insight:  Lacking  Psychomotor Activity:  Normal  Concentration:  Concentration: Fair and Attention Span: Fair  Recall:  [employer]Fair  Fund of Knowledge:  Fair  Language:  Fair  Akathisia:  Negative  Handed:  Right  AIMS (if indicated):     Assets:  Desire for Improvement Resilience  ADL's:  Intact  Cognition:  WNL  Sleep:  Number of Hours: 6.25     Treatment Plan Summary: Daily contact with patient to assess and evaluate symptoms and progress in treatment, Medication management and Plan : Patient is seen and examined.  Patient is a 48 year old female with the above-stated past psychiatric history who is seen in follow-up.   Diagnosis: #1 alcohol dependence, #2 alcohol withdrawal (uncomplicated), #3 anemia, #4 abnormal liver function enzymes, #5 hypertension, #6 hypokalemia  Patient is seen and examined.  Patient is a 48 year old female with the above-stated past psychiatric and medical history who is seen in follow-up.  From a psychiatric perspective she is stable at this point.  Her blood  pressure is elevated, but that may be related to her alcohol withdrawal symptoms.  Her CIWA was only 1 when it was last measured, so most likely secondary to her underlying essential hypertension.  Her liver function enzymes are abnormal, and her alkaline phosphatase is significantly elevated at 146.  We will obtain a abdominal ultrasound and look at her liver as well as her gallbladder to make sure nothing is going on there.  She denied any recent nausea, vomiting, fever, chills.  She also denied any blood in her stool.  She denied that she was on her menstrual period.  Her anemia seems to have improved by 2 g since admission, but I will add Protonix for gastric protection just in case she has a significant enough gastritis to lead to a loss of blood.  She is already on thiamine 100 mg  p.o. daily, and because of her abnormal MCV I will add folic acid 1 mg p.o. daily.  She also continues on her ferrous sulfate 325 mg p.o. daily.  It was recommended to start amlodipine and hydralazine.  The patient stated she had had lower extremity edema, and that may be related to the calcium channel blocker.  I am going to stop the hydralazine as well as the amlodipine, and increase her carvedilol to 12.5 mg p.o. twice daily.  We will continue the clonidine 0.1 mg p.o. every 8 hours for systolic blood pressure greater than 160.  We will also continue her losartan at 50 mg p.o. daily for hypertension.  We will continue to monitor her for withdrawal symptoms as well. 1.  Increase carvedilol to 12.5 mg p.o. twice daily for hypertension. 2.  Continue clonidine 0.1 mg p.o. every 8 hours as needed systolic blood pressure greater than 160. 3.  Continue ferrous sulfate 325 mg p.o. daily for anemia. 4.  Add folic acid 1 mg p.o. daily for nutritional supplementation. 5.  Continue lorazepam 1 mg p.o. every 6 hours PRN a CIWA greater than 10.  This is for alcohol withdrawal. 6.  Continue losartan 50 mg p.o. daily for hypertension. 7.   Give potassium chloride 20 mEq p.o. x1 for hypokalemia. 8.  Continue thiamine 100 mg p.o. daily for anemia nutritional supplementation. 9.  Stop hydralazine and amlodipine secondary to side effects. 10.  Abdominal ultrasound to rule out liver disease as well as gallbladder disease. 11.  Disposition planning-in progress.  Antonieta PertGreg Lawson Clary, MD 06/11/2019, 12:43 PM

## 2019-06-11 NOTE — [facility] Group Notes (Signed)
[facility] Group Therapy 06/11/2019 2:53 PM  Type of Therapy: [facility] Presentation  Participation Level: Did not attend.    Yazhini Mcaulay, MSW, LCSWA 06/11/2019 2:53 PM 

## 2019-06-12 LAB — CBC WITH DIFFERENTIAL/PLATELET
Abs Immature Granulocytes: 0.02 10*3/uL (ref 0.00–0.07)
Basophils Absolute: 0 10*3/uL (ref 0.0–0.1)
Basophils Relative: 0 %
Eosinophils Absolute: 0.2 10*3/uL (ref 0.0–0.5)
Eosinophils Relative: 3 %
HCT: 24.9 % — ABNORMAL LOW (ref 36.0–46.0)
Hemoglobin: 7.7 g/dL — ABNORMAL LOW (ref 12.0–15.0)
Immature Granulocytes: 0 %
Lymphocytes Relative: 28 %
Lymphs Abs: 1.3 10*3/uL (ref 0.7–4.0)
MCH: 23.1 pg — ABNORMAL LOW (ref 26.0–34.0)
MCHC: 30.9 g/dL (ref 30.0–36.0)
MCV: 74.6 fL — ABNORMAL LOW (ref 80.0–100.0)
Monocytes Absolute: 0.4 10*3/uL (ref 0.1–1.0)
Monocytes Relative: 7 %
Neutro Abs: 2.9 10*3/uL (ref 1.7–7.7)
Neutrophils Relative %: 62 %
Platelets: 141 10*3/uL — ABNORMAL LOW (ref 150–400)
RBC: 3.34 MIL/uL — ABNORMAL LOW (ref 3.87–5.11)
RDW: 19.3 % — ABNORMAL HIGH (ref 11.5–15.5)
WBC: 4.7 10*3/uL (ref 4.0–10.5)
nRBC: 0 % (ref 0.0–0.2)

## 2019-06-12 LAB — COMPREHENSIVE METABOLIC PANEL
ALT: 23 U/L (ref 0–44)
AST: 71 U/L — ABNORMAL HIGH (ref 15–41)
Albumin: 1.9 g/dL — ABNORMAL LOW (ref 3.5–5.0)
Alkaline Phosphatase: 175 U/L — ABNORMAL HIGH (ref 38–126)
Anion gap: 7 (ref 5–15)
BUN: 8 mg/dL (ref 6–20)
CO2: 23 mmol/L (ref 22–32)
Calcium: 7.7 mg/dL — ABNORMAL LOW (ref 8.9–10.3)
Chloride: 109 mmol/L (ref 98–111)
Creatinine, Ser: 1.7 mg/dL — ABNORMAL HIGH (ref 0.44–1.00)
GFR calc Af Amer: 41 mL/min — ABNORMAL LOW (ref >60)
GFR calc non Af Amer: 36 mL/min — ABNORMAL LOW (ref >60)
Glucose, Bld: 122 mg/dL — ABNORMAL HIGH (ref 70–99)
Potassium: 3.3 mmol/L — ABNORMAL LOW (ref 3.5–5.1)
Sodium: 139 mmol/L (ref 135–145)
Total Bilirubin: 2.8 mg/dL — ABNORMAL HIGH (ref 0.3–1.2)
Total Protein: 6.7 g/dL (ref 6.5–8.1)

## 2019-06-12 NOTE — Progress Notes (Signed)
Patient ID: Allison [name], female   DOB: 10/03/1971, 48 y.o.   MRN: [id number] [facility] NOVEL CORONAVIRUS (COVID-19) DAILY CHECK-OFF SYMPTOMS - answer yes or no to each - every day NO YES  Have you had a fever in the past 24 hours?  . Fever (Temp > 37.80C / 100F) X   Have you had any of these symptoms in the past 24 hours? . New Cough .  Sore Throat  .  Shortness of Breath .  Difficulty Breathing .  Unexplained Body Aches   X   Have you had any one of these symptoms in the past 24 hours not related to allergies?   . Runny Nose .  Nasal Congestion .  Sneezing   X   If you have had runny nose, nasal congestion, sneezing in the past 24 hours, has it worsened?  X   EXPOSURES - check yes or no X   Have you traveled outside the state in the past 14 days?  X   Have you been in contact with someone with a confirmed diagnosis of COVID-19 or PUI in the past 14 days without wearing appropriate PPE?  X   Have you been living in the same home as a person with confirmed diagnosis of COVID-19 or a PUI (household contact)?    X   Have you been diagnosed with COVID-19?    X              What to do next: Answered NO to all: Answered YES to anything:   Proceed with unit schedule Follow the [facility] Inpatient Flowsheet.

## 2019-06-12 NOTE — Progress Notes (Signed)
Patient ID: Allison [name], female   DOB: Apr 09, 1971, 48 y.o.   MRN: [id number] Pt affect and mood sad, anxious. Flat, depressed. Pt has been in and out of her room sitting in the dayroom quietly when out of room. Minimal interaction with peers. Pt has been focused on discharging tomorrow. Pt has been calling her husband and getting more anxious because he is telling her he does know anything about d/c tomorrow. Pt contracts for safety. No physical c/o.

## 2019-06-12 NOTE — Progress Notes (Signed)
[facility] MD Progress Note  06/12/2019 12:31 PM Allison [name]  MRN:  [id number] Subjective:  Patient is a 48 year old female with a past psychiatric history significant for alcohol use disorder, alcohol induced mood disorder and alcohol dependence who was admitted on 06/10/2019 with aggressive threatening behavior to kill her self and continued alcohol abuse.  Objective: Patient is seen and examined.  Patient is a 48 year old female with the above-stated past psychiatric history is seen in follow-up.  She is slowly improving.  Her mood and affect are improving.  She denied any active alcohol withdrawal symptoms.  She had abnormal liver function enzymes as well as alkaline phosphatase.  An abdominal ultrasound was ordered for yesterday.  There was concern for possible gallstones as well as inflammation secondary to her alcohol.  The ultrasound revealed essentially an equivocal bowel result for acute cholecystitis with the presence of gallbladder wall thickening, pericolic cystic fluid and biliary sludge/microlithiasis.  There was a negative sonographic Murphy sign.  The rest of the examination was negative.  Her repeat liver function enzymes are pending.  She denied any nausea, vomiting, fever or chills.  The changes in her blood pressure medicine have moderately improved her blood pressure.  Her pulse today 68, her blood pressure is 138/86.  She is afebrile.  She slept 5.75 hours last night.  Her last CIWA this a.m. was 0.  She denied any suicidal or homicidal ideation.  Principal Problem: <principal problem not specified> Diagnosis: Active Problems:   Bipolar disorder, unspecified ([name])  Total Time spent with patient: 20 minutes  Past Psychiatric History: See admission H&P  Past Medical History:  Past Medical History:  Diagnosis Date  . GERD (gastroesophageal reflux disease)   . Hypertension    History reviewed. No pertinent surgical history. Family History:  Family History  Problem Relation Age  of Onset  . Hypertension Mother   . Diabetes Mother   . Hypertension Father    Family Psychiatric  History: See admission H&P Social History:  Social History   Substance and Sexual Activity  Alcohol Use Yes  . Alcohol/week: 14.0 standard drinks  . Types: 14 Standard drinks or equivalent per week   Comment: pint of liquor     Social History   Substance and Sexual Activity  Drug Use Yes  . Types: Marijuana    Social History   Socioeconomic History  . Marital status: Married    Spouse name: Not on file  . Number of children: Not on file  . Years of education: Not on file  . Highest education level: Not on file  Occupational History  . Not on file  Social Needs  . Financial resource strain: Patient refused  . Food insecurity    Worry: Patient refused    Inability: Patient refused  . Transportation needs    Medical: Patient refused    Non-medical: Patient refused  Tobacco Use  . Smoking status: Never Smoker  . Smokeless tobacco: Never Used  Substance and Sexual Activity  . Alcohol use: Yes    Alcohol/week: 14.0 standard drinks    Types: 14 Standard drinks or equivalent per week    Comment: pint of liquor  . Drug use: Yes    Types: Marijuana  . Sexual activity: Yes    Birth control/protection: Implant  Lifestyle  . Physical activity    Days per week: Patient refused    Minutes per session: Patient refused  . Stress: Patient refused  Relationships  . Social connections  Talks on phone: Patient refused    Gets together: Patient refused    Attends religious service: Patient refused    Active member of club or organization: Patient refused    Attends meetings of clubs or organizations: Patient refused    Relationship status: Patient refused  Other Topics Concern  . Not on file  Social History Narrative  . Not on file   Additional Social History:                         Sleep: Fair  Appetite:  Fair  Current Medications: Current  Facility-Administered Medications  Medication Dose Route Frequency Provider Last Rate Last Dose  . acetaminophen (TYLENOL) tablet 650 mg  650 mg Oral Q6H PRN Money, Gerlene Burdockravis B, FNP      . alum & mag hydroxide-simeth (MAALOX/MYLANTA) 200-200-20 MG/5ML suspension 30 mL  30 mL Oral Q4H PRN Money, Feliz Beamravis B, FNP      . carvedilol (COREG) tablet 12.5 mg  12.5 mg Oral BID WC Antonieta Pertlary, Greg Lawson, MD   12.5 mg at 06/12/19 0950  . cloNIDine (CATAPRES) tablet 0.1 mg  0.1 mg Oral Q8H PRN Hughie ClossPahwani, Ravi, MD   0.1 mg at 06/10/19 2119  . feeding supplement (ENSURE ENLIVE) (ENSURE ENLIVE) liquid 237 mL  237 mL Oral Q24H Cobos, Fernando A, MD      . ferrous sulfate tablet 325 mg  325 mg Oral Q breakfast Cobos, Rockey SituFernando A, MD   325 mg at 06/12/19 0950  . folic acid (FOLVITE) tablet 1 mg  1 mg Oral Daily Antonieta Pertlary, Greg Lawson, MD   1 mg at 06/12/19 0950  . LORazepam (ATIVAN) tablet 0.5 mg  0.5 mg Oral Q6H PRN Cobos, Rockey SituFernando A, MD   0.5 mg at 06/11/19 2150  . LORazepam (ATIVAN) tablet 1 mg  1 mg Oral Q6H PRN Money, Gerlene Burdockravis B, FNP      . losartan (COZAAR) tablet 50 mg  50 mg Oral Daily Money, Travis B, FNP   50 mg at 06/12/19 0950  . magnesium hydroxide (MILK OF MAGNESIA) suspension 30 mL  30 mL Oral Daily PRN Money, Gerlene Burdockravis B, FNP      . multivitamin with minerals tablet 1 tablet  1 tablet Oral Daily Money, Gerlene Burdockravis B, FNP   1 tablet at 06/12/19 0950  . pantoprazole (PROTONIX) EC tablet 40 mg  40 mg Oral Daily Antonieta Pertlary, Greg Lawson, MD   40 mg at 06/12/19 0950  . thiamine (B-1) injection 100 mg  100 mg Intramuscular Once Money, Feliz Beamravis B, FNP      . thiamine (VITAMIN B-1) tablet 100 mg  100 mg Oral Daily Money, Gerlene Burdockravis B, FNP   100 mg at 06/12/19 [id number]    Lab Results:  Results for orders placed or performed during the hospital encounter of 06/10/19 (from the past 48 hour(s))  Glucose, capillary     Status: Abnormal   Collection Time: 06/10/19  8:11 PM  Result Value Ref Range   Glucose-Capillary 121 (H) 70 - 99 mg/dL   Hemoglobin X5MA1c     Status: Abnormal   Collection Time: 06/11/19  6:17 AM  Result Value Ref Range   Hgb A1c MFr Bld 4.4 (L) 4.8 - 5.6 %    Comment: (NOTE) Pre diabetes:          5.7%-6.4% Diabetes:              >6.4% Glycemic control for   <7.0% adults with diabetes  Mean Plasma Glucose 79.58 mg/dL    Comment: Performed at [facility], [address] [place], [place] [address]  TSH     Status: None   Collection Time: 06/11/19  6:17 AM  Result Value Ref Range   TSH 4.123 0.350 - 4.500 uIU/mL    Comment: Performed by a 3rd Generation assay with a functional sensitivity of <=0.01 uIU/mL. Performed at [facility], [address] [place], [place] [address]   Lipid panel     Status: None   Collection Time: 06/11/19  6:17 AM  Result Value Ref Range   Cholesterol 173 0 - 200 mg/dL    Comment: Performed at [facility], [address] [place], [place] [address]   Triglycerides 61 <150 mg/dL    Comment: Performed at [facility], [address] [place], [place] [address]   HDL 41 >40 mg/dL    Comment: Performed at [facility], [address] [place], [place] [address]   Total CHOL/HDL Ratio 4.2 RATIO    Comment: Performed at [facility], [address] [place], [place] [address]   VLDL 12 0 - 40 mg/dL    Comment: Performed at [facility], [address] [place], [place] [address]   LDL Cholesterol NOT CALCULATED 0 - 99 mg/dL    Comment: Performed at [facility], [address] [place], [place] [address]  Basic metabolic panel     Status: Abnormal   Collection Time: 06/11/19  6:17 AM  Result Value Ref Range   Sodium 141 135 - 145 mmol/L   Potassium 2.8 (L) 3.5 - 5.1 mmol/L   Chloride 109 98 - 111 mmol/L   CO2 23 22 - 32 mmol/L   Glucose, Bld 71 70 - 99 mg/dL   BUN 6 6 - 20 mg/dL   Creatinine, Ser 2.842.09 (H) 0.44 - 1.00 mg/dL   Calcium 7.6 (L) 8.9 - 10.3 mg/dL   GFR calc non Af Amer 28 (L) >60  mL/min   GFR calc Af Amer 32 (L) >60 mL/min   Anion gap 9 5 - 15    Comment: Performed at [facility], [address] [place], [place] [address]    Blood Alcohol level:  Lab Results  Component Value Date   ETH 342 [facility](HH) 06/08/2019   ETH 519 (HH) 06/07/2019    Metabolic Disorder Labs: Lab Results  Component Value Date   HGBA1C 4.4 (L) 06/11/2019   MPG 79.58 06/11/2019   MPG 189 05/22/2016   No results found for: PROLACTIN Lab Results  Component Value Date   CHOL 173 06/11/2019   TRIG 61 06/11/2019   HDL 41 06/11/2019   CHOLHDL 4.2 06/11/2019   VLDL 12 06/11/2019   LDLCALC NOT CALCULATED 06/11/2019    Physical Findings: AIMS: Facial and Oral Movements Muscles of Facial Expression: None, normal Lips and Perioral Area: None, normal Jaw: None, normal Tongue: None, normal,Extremity Movements Upper (arms, wrists, hands, fingers): None, normal Lower (legs, knees, ankles, toes): None, normal, Trunk Movements Neck, shoulders, hips: None, normal, Overall Severity Severity of abnormal movements (highest score from questions above): None, normal Incapacitation due to abnormal movements: None, normal Patient's awareness of abnormal movements (rate only patient's report): No Awareness, Dental Status Current problems with teeth and/or dentures?: No Does patient usually wear dentures?: No  CIWA:  CIWA-Ar Total: 0 COWS:     Musculoskeletal: Strength & Muscle Tone: within  normal limits Gait & Station: normal Patient leans: N/A  Psychiatric Specialty Exam: Physical Exam  Nursing note and vitals reviewed. Constitutional: She is oriented to person, place, and time. She appears well-developed and well-nourished.  HENT:  Head: Normocephalic and atraumatic.  Respiratory: Effort normal.  Neurological: She is alert and oriented to person, place, and time.    ROS  Blood pressure 138/86, pulse 68, temperature 98.2 F (36.8 C), temperature source Oral, resp. rate  20, height 5\' 3"  (1.6 m), weight 66.7 kg, SpO2 100 %.Body mass index is 26.04 kg/m.  General Appearance: Casual  Eye Contact:  Fair  Speech:  Normal Rate  Volume:  Normal  Mood:  Anxious  Affect:  Congruent  Thought Process:  Coherent and Descriptions of Associations: Intact  Orientation:  Full (Time, Place, and Person)  Thought Content:  Logical  Suicidal Thoughts:  No  Homicidal Thoughts:  No  Memory:  Immediate;   Fair Recent;   Fair Remote;   Fair  Judgement:  Intact  Insight:  Fair  Psychomotor Activity:  Normal  Concentration:  Concentration: Fair and Attention Span: Fair  Recall:  [employer]Fair  Fund of Knowledge:  Fair  Language:  Good  Akathisia:  Negative  Handed:  Right  AIMS (if indicated):     Assets:  Desire for Improvement Resilience  ADL's:  Intact  Cognition:  WNL  Sleep:  Number of Hours: 5.75     Treatment Plan Summary: Daily contact with patient to assess and evaluate symptoms and progress in treatment, Medication management and Plan : Patient is seen and examined.  Patient is a 48 year old female with the above-stated past psychiatric history who is seen in follow-up.   Diagnosis: #1 alcohol dependence, #2 alcohol withdrawal (uncomplicated), #3 anemia, #4 abnormal liver function enzymes, #5 hypertension, #6 hypokalemia  Patient is slowly improving.  No major withdrawal symptoms currently.  Mood and affect are stable.  Her blood pressure is improving.  Her potassium is still low and we will have to repeat that.  We will repeat her liver function enzymes as well as her alkaline phosphatase this p.m.  I will increase her losartan to 100 mg p.o. daily for hypertension.  No change in the carvedilol at this point.  Her heart rate is stable at the low 70s.  As well, her diabetes seems to be stable at this point.  No other changes in her medications at this time.  Hopefully she will continue to improve. 1.  Continue carvedilol 12.5 mg p.o. twice daily for hypertension. 2.   Continue clonidine 0.1 mg p.o. every 8 hours as needed systolic blood pressure greater than 160. 3.  Continue iron sulfate 325 mg p.o. daily with breakfast for anemia. 4.  Continue folic acid 1 mg p.o. daily for nutritional supplementation. 5.  Continue lorazepam as needed for CIWA greater than 10. 6.  Increase losartan 200 mg p.o. daily for hypertension. 7.  Continue multivitamin 1 tablet p.o. daily for nutritional supplementation. 8.  Continue Protonix 40 mg p.o. daily for gastric protection. 9.  Continue thiamine 100 mg p.o. daily for nutritional supplementation. 10.  Disposition planning-in progress.  Antonieta PertGreg Lawson Clary, MD 06/12/2019, 12:31 PM

## 2019-06-12 NOTE — [facility] Suicide Risk Assessment (Signed)
[place] INPATIENT:  Family/Significant Other Suicide Prevention Education  Suicide Prevention Education:  Education Completed; Vedia Coffer [phone] has been identified by the patient as the family member/significant other with whom the patient will be residing, and identified as the person(s) who will aid the patient in the event of a mental health crisis (suicidal ideations/suicide attempt).  With written consent from the patient, the family member/significant other has been provided the following suicide prevention education, prior to the and/or following the discharge of the patient.  The suicide prevention education provided includes the following:  Suicide risk factors  Suicide prevention and interventions  National Suicide Hotline telephone number  [facility] assessment telephone number  [facility] Emergency Assistance [place] and/or Residential Mobile Crisis Unit telephone number  Request made of family/significant other to:  Remove weapons (e.g., guns, rifles, knives), all items previously/currently identified as safety concern.    Remove drugs/medications (over-the-counter, prescriptions, illicit drugs), all items previously/currently identified as a safety concern.  The family member/significant other verbalizes understanding of the suicide prevention education information provided.  The family member/significant other agrees to remove the items of safety concern listed above. Mr. Rosemarie Ax reports the pt was brought to the hospital because she was wandering from home and drinking excessive amounts of alcohol. He reports no reservation about the pt returning home and denies her having access to guns or weapons in the home. Mr. Rosemarie Ax reports the pt has a strong support system.  Tammela Bales T Olevia Westervelt 06/12/2019, 4:08 PM

## 2019-06-12 NOTE — Progress Notes (Signed)
Nursing Progress Note: 7p-7a D:Pt currently presents with a depressed/anxiousaffect and behavior. Pt sts, "I am ready to go home." Interacting appropriatelywith the milieu. Pt reports goodsleep during the previous night with current medication regimen.   A:Pt provided with medications per providers orders. Pt's labs and vitals were monitored throughout the night. Pt supported emotionally and encouraged to express concerns and questions. Pt educated on medications.  R:Pt's safety ensured with 15 minute and environmental checks. Pt currently denies SI, HI, and AVH. Pt verbally contracts to seek staff if SI,HI, or AVH occurs and to consult with staff before acting on any harmful thoughts. Will continue to monitor.   [place] NOVEL CORONAVIRUS (COVID-19) DAILY CHECK-OFF SYMPTOMS - answer yes or no to each - every day NO YES  Have you had a fever in the past 24 hours?  . Fever (Temp > 37.80C / 100F) X   Have you had any of these symptoms in the past 24 hours? . New Cough .  Sore Throat  .  Shortness of Breath .  Difficulty Breathing .  Unexplained Body Aches   X   Have you had any one of these symptoms in the past 24 hours not related to allergies?   . Runny Nose .  Nasal Congestion .  Sneezing   X   If you have had runny nose, nasal congestion, sneezing in the past 24 hours, has it worsened?  X   EXPOSURES - check yes or no X   Have you traveled outside the state in the past 14 days?  X   Have you been in contact with someone with a confirmed diagnosis of COVID-19 or PUI in the past 14 days without wearing appropriate PPE?  X   Have you been living in the same home as a person with confirmed diagnosis of COVID-19 or a PUI (household contact)?    X   Have you been diagnosed with COVID-19?    X              What to do next: Answered NO to all: Answered YES to anything:   Proceed with unit schedule Follow the [facility] Inpatient Flowsheet.

## 2019-06-12 NOTE — Progress Notes (Signed)
Adult Psychoeducational Group Note  Date:  06/12/2019 Time:  9:10 PM  Group Topic/Focus:  Wrap-Up Group:   The focus of this group is to help patients review their daily goal of treatment and discuss progress on daily workbooks.  Participation Level:  Active  Participation Quality:  Appropriate  Affect:  Appropriate  Cognitive:  Alert  Insight: Appropriate  Engagement in Group:  Engaged  Modes of Intervention:  Discussion  Additional Comments:  Pt rated day 9/10. Pt rated that her goal is to stop being afraid to express herself. Pt wants to become more open with others.   Allison [name] 06/12/2019, 9:10 PM

## 2019-06-12 NOTE — Progress Notes (Signed)
Recreation Therapy Notes  Date:  6.17.20 Time: 0930 Location: [address] Dayroom  Group Topic: Stress Management  Goal Area(s) Addresses:  Patient will identify positive stress management techniques. Patient will identify benefits of using stress management post d/c.  Intervention:  Stress Management  Activity :  Meditation.  LRT introduced the stress management technique of meditation.  LRT played a meditation on letting go of the past and focusing on the present.  Patients were to follow along as the meditation was played to engage in activity.    Education:  Stress Management, Discharge Planning.   Education Outcome: Acknowledges Education  Clinical Observations/Feedback:  Pt did not attend group.     Victorino Sparrow, LRT/CTRS        Victorino Sparrow A 06/12/2019 11:23 AM

## 2019-06-13 MED ORDER — CARVEDILOL 12.5 MG PO TABS: 12.5000 mg | ORAL_TABLET | Freq: Two times a day (BID) | ORAL | 0 refills | Status: DC

## 2019-06-13 MED ORDER — FOLIC ACID 1 MG PO TABS: 1.0000 mg | ORAL_TABLET | Freq: Every day | ORAL | 0 refills | Status: DC

## 2019-06-13 MED ORDER — VITAMIN B-1 100 MG PO TABS: 100.0000 mg | ORAL_TABLET | Freq: Every day | ORAL | 0 refills | Status: DC

## 2019-06-13 MED ORDER — LOSARTAN POTASSIUM 50 MG PO TABS: 50.0000 mg | ORAL_TABLET | Freq: Every day | ORAL | 0 refills | Status: DC

## 2019-06-13 MED ORDER — PANTOPRAZOLE SODIUM 40 MG PO TBEC: 40.0000 mg | DELAYED_RELEASE_TABLET | Freq: Every day | ORAL | 0 refills | Status: DC

## 2019-06-13 MED ORDER — FERROUS SULFATE 325 (65 FE) MG PO TABS: 325.0000 mg | ORAL_TABLET | Freq: Every day | ORAL | 0 refills | Status: DC

## 2019-06-13 NOTE — Progress Notes (Signed)
Patient ID: Allison [name], female   DOB: 12/03/1971, 48 y.o.   MRN: [id number] Patient discharged to home/self care in the presence of family.  Patient denies SI, HI and AVH upon discharge.  Patient acknowledges receipt of all personal belongings and understanding of all discharge instructions.
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
## 2019-06-13 NOTE — Progress Notes (Signed)
  [facility] Adult Case Management Discharge Plan :  Will you be returning to the same living situation after discharge:  Yes,  home At discharge, do you have transportation home?: Yes,  husband will pick up Do you have the ability to pay for your medications: No. Referred to [facility] and [facility].   Release of information consent forms completed and in the chart; letter on chart.  Patient to Follow up at: Follow-up Information    [facility] Follow up on 06/19/2019.   Why: Telephonic hospital follow up appointment is Wednesday, 6/24 at 8:30a. The provider will cntact you.  Contact information: [place] [address] [place] [facility] Follow up on 06/18/2019.   Why: Primary care appointment with Dr. Raul Del is Tuesday, 6/23 at 2:30p. Please bring your photo ID, insurance card, and current medications.  Contact information: [address]  [place] [address] [phone]          Next level of care provider has access to [place] and Suicide Prevention discussed: Yes,  with husband  Have you used any form of tobacco in the last 30 days? (Cigarettes, Smokeless Tobacco, Cigars, and/or Pipes): No  Has patient been referred to the Quitline?: N/A patient is not a smoker  Patient has been referred for addiction treatment: Yes  Joellen Jersey, [place] 06/13/2019, 9:44 AM

## 2019-06-13 NOTE — [facility] Suicide Risk Assessment (Signed)
[facility] Discharge Suicide Risk Assessment   Principal Problem: <principal problem not specified> Discharge Diagnoses: Active Problems:   Bipolar disorder, unspecified ([place])   Total Time spent with patient: 15 minutes  Musculoskeletal: Strength & Muscle Tone: within normal limits Gait & Station: normal Patient leans: N/A  Psychiatric Specialty Exam: Review of Systems  All other systems reviewed and are negative.   Blood pressure (!) 160/92, pulse 66, temperature 98.6 F (37 C), temperature source Oral, resp. rate 20, height 5\' 3"  (1.6 m), weight 66.7 kg, SpO2 100 %.Body mass index is 26.04 kg/m.  General Appearance: Casual  Eye Contact::  Fair  Speech:  Normal Rate409  Volume:  Normal  Mood:  Euthymic  Affect:  Congruent  Thought Process:  Coherent and Descriptions of Associations: Intact  Orientation:  Full (Time, Place, and Person)  Thought Content:  Logical  Suicidal Thoughts:  No  Homicidal Thoughts:  No  Memory:  Immediate;   Fair Recent;   Fair Remote;   Fair  Judgement:  Intact  Insight:  Fair  Psychomotor Activity:  Normal  Concentration:  Fair  Recall:  [employer] of Knowledge:Fair  Language: Fair  Akathisia:  Negative  Handed:  Right  AIMS (if indicated):     Assets:  Desire for Improvement Housing Resilience  Sleep:  Number of Hours: 6.75  Cognition: WNL  ADL's:  Intact   Mental Status Per Nursing Assessment::   On Admission:  Suicidal ideation indicated by others, Suicide plan, Self-harm thoughts  Demographic Factors:  Low socioeconomic status  Loss Factors: Decline in physical health  Historical Factors: Impulsivity  Risk Reduction Factors:   Sense of responsibility to family, Living with another person, especially a relative and Positive social support  Continued Clinical Symptoms:  Depression:   Comorbid alcohol abuse/dependence Impulsivity Alcohol/Substance Abuse/Dependencies  Cognitive Features That Contribute To Risk:  None     Suicide Risk:  Minimal: No identifiable suicidal ideation.  Patients presenting with no risk factors but with morbid ruminations; may be classified as minimal risk based on the severity of the depressive symptoms  Follow-up Information    [facility] Follow up on 06/19/2019.   Why: Telephonic hospital follow up appointment is Wednesday, 6/24 at 8:30a. The provider will cntact you.  Contact information: [place] [address] [place] [facility] Follow up on 06/18/2019.   Why: Primary care appointment with Dr. Raul Del is Tuesday, 6/23 at 2:30p. Please bring your photo ID, insurance card, and current medications.  Contact information: [place] [address] [phone]          Plan Of Care/Follow-up recommendations:  Activity:  ad lib  Sharma Covert, MD 06/13/2019, 8:11 AM

## 2019-06-18 ENCOUNTER — Other Ambulatory Visit: Payer: Self-pay

## 2019-06-18 ENCOUNTER — Ambulatory Visit: Payer: Self-pay | Attending: Nurse Practitioner | Admitting: Nurse Practitioner

## 2019-06-23 ENCOUNTER — Other Ambulatory Visit: Payer: Self-pay

## 2019-06-23 ENCOUNTER — Emergency Department (HOSPITAL_COMMUNITY)
Admission: EM | Admit: 2019-06-23 | Discharge: 2019-06-24 | Disposition: A | Discharge status: Home / Self Care | Payer: Self-pay | Attending: Emergency Medicine | Admitting: Emergency Medicine

## 2019-06-23 DIAGNOSIS — I10 Essential (primary) hypertension: Secondary | ICD-10-CM | POA: Insufficient documentation

## 2019-06-23 DIAGNOSIS — Z79899 Other long term (current) drug therapy: Secondary | ICD-10-CM | POA: Insufficient documentation

## 2019-06-23 DIAGNOSIS — R45851 Suicidal ideations: Secondary | ICD-10-CM

## 2019-06-23 DIAGNOSIS — Z03818 Encounter for observation for suspected exposure to other biological agents ruled out: Secondary | ICD-10-CM | POA: Insufficient documentation

## 2019-06-23 DIAGNOSIS — F1020 Alcohol dependence, uncomplicated: Secondary | ICD-10-CM | POA: Insufficient documentation

## 2019-06-23 DIAGNOSIS — F332 Major depressive disorder, recurrent severe without psychotic features: Secondary | ICD-10-CM | POA: Insufficient documentation

## 2019-06-23 LAB — CBC
HCT: 29.5 % — ABNORMAL LOW (ref 36.0–46.0)
Hemoglobin: 9.2 g/dL — ABNORMAL LOW (ref 12.0–15.0)
MCH: 22.8 pg — ABNORMAL LOW (ref 26.0–34.0)
MCHC: 31.2 g/dL (ref 30.0–36.0)
MCV: 73 fL — ABNORMAL LOW (ref 80.0–100.0)
Platelets: 353 10*3/uL (ref 150–400)
RBC: 4.04 MIL/uL (ref 3.87–5.11)
RDW: 19.7 % — ABNORMAL HIGH (ref 11.5–15.5)
WBC: 7.5 10*3/uL (ref 4.0–10.5)
nRBC: 0 % (ref 0.0–0.2)

## 2019-06-23 LAB — COMPREHENSIVE METABOLIC PANEL
ALT: 28 U/L (ref 0–44)
AST: 90 U/L — ABNORMAL HIGH (ref 15–41)
Albumin: 2.2 g/dL — ABNORMAL LOW (ref 3.5–5.0)
Alkaline Phosphatase: 187 U/L — ABNORMAL HIGH (ref 38–126)
Anion gap: 8 (ref 5–15)
BUN: 5 mg/dL — ABNORMAL LOW (ref 6–20)
CO2: 25 mmol/L (ref 22–32)
Calcium: 7.8 mg/dL — ABNORMAL LOW (ref 8.9–10.3)
Chloride: 112 mmol/L — ABNORMAL HIGH (ref 98–111)
Creatinine, Ser: 1.03 mg/dL — ABNORMAL HIGH (ref 0.44–1.00)
GFR calc Af Amer: >60 mL/min (ref >60)
GFR calc non Af Amer: >60 mL/min (ref >60)
Glucose, Bld: 95 mg/dL (ref 70–99)
Potassium: 3.5 mmol/L (ref 3.5–5.1)
Sodium: 145 mmol/L (ref 135–145)
Total Bilirubin: 2.3 mg/dL — ABNORMAL HIGH (ref 0.3–1.2)
Total Protein: 8 g/dL (ref 6.5–8.1)

## 2019-06-23 LAB — SALICYLATE LEVEL: Salicylate Lvl: <7 mg/dL (ref 2.8–30.0)

## 2019-06-23 LAB — ACETAMINOPHEN LEVEL: Acetaminophen (Tylenol), Serum: <7 ug/mL — ABNORMAL LOW (ref 10–30)

## 2019-06-23 LAB — SARS CORONAVIRUS 2 BY RT PCR (HOSPITAL ORDER, PERFORMED IN ~~LOC~~ HOSPITAL LAB): SARS Coronavirus 2: NEGATIVE

## 2019-06-23 LAB — I-STAT BETA HCG BLOOD, ED (MC, WL, AP ONLY): I-stat hCG, quantitative: 6.3 m[IU]/mL — ABNORMAL HIGH (ref <5)

## 2019-06-23 LAB — ETHANOL: Alcohol, Ethyl (B): 456 mg/dL (ref <10)

## 2019-06-23 MED ORDER — LORAZEPAM 1 MG PO TABS: 0.0000 mg | ORAL_TABLET | Freq: Four times a day (QID) | ORAL | Status: DC

## 2019-06-23 MED ORDER — THIAMINE HCL 100 MG/ML IJ SOLN: 100.0000 mg | Freq: Every day | INTRAMUSCULAR | Status: DC

## 2019-06-23 MED ORDER — ZIPRASIDONE MESYLATE 20 MG IM SOLR
10.0000 mg | Freq: Once | INTRAMUSCULAR | Status: DC
  Filled 2019-06-23: qty 20

## 2019-06-23 MED ORDER — LORAZEPAM 1 MG PO TABS: 0.0000 mg | ORAL_TABLET | Freq: Two times a day (BID) | ORAL | Status: DC

## 2019-06-23 MED ORDER — STERILE WATER FOR INJECTION IJ SOLN
INTRAMUSCULAR | Status: AC
  Administered 2019-06-23: 18:00:00 1.2 mL
  Filled 2019-06-23: qty 10

## 2019-06-23 MED ORDER — LORAZEPAM 2 MG/ML IJ SOLN: 0.0000 mg | Freq: Two times a day (BID) | INTRAMUSCULAR | Status: DC

## 2019-06-23 MED ORDER — LORAZEPAM 2 MG/ML IJ SOLN: 0.0000 mg | Freq: Four times a day (QID) | INTRAMUSCULAR | Status: DC

## 2019-06-23 MED ORDER — ZIPRASIDONE MESYLATE 20 MG IM SOLR
20.0000 mg | Freq: Once | INTRAMUSCULAR | Status: AC
  Administered 2019-06-23: 20 mg via INTRAMUSCULAR

## 2019-06-23 MED ORDER — VITAMIN B-1 100 MG PO TABS
100.0000 mg | ORAL_TABLET | Freq: Every day | ORAL | Status: DC
  Administered 2019-06-24: 13:00:00 100 mg via ORAL
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
## 2019-06-23 NOTE — ED Notes (Signed)
Date and time results received: 06/23/19 1820 (use smartphrase ".now" to insert current time)  Test: ethanol Critical Value: 456  Name of Provider Notified: [email or web address]  Orders Received? Or Actions Taken?: no orders at this time
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
## 2019-06-23 NOTE — ED Provider Notes (Signed)
Brief care assumed at shift change from Dr. Sabra Heck.  Patient presenting under IVC, intoxicated with alcohol, combative, and threatening to harm her children and husband.  Also having AVH.  Patient required Geodon upon arrival.  Labs reveal significantly elevated alcohol level of 456.  Patient is under CIWA protocol.  On my evaluation, patient is sleeping.   At this time, patient will need TTS consult when she is sobered enough for evaluation.  Care will be assumed by PA Hammon.   [name], [place] N, PA-C 06/23/19 2111    Allison Chapel, MD 06/25/19 [phone]

## 2019-06-23 NOTE — ED Notes (Signed)
This rn called staffing and they stated they do not have any sitters at this time

## 2019-06-23 NOTE — ED Provider Notes (Signed)
I assumed care of patient from previous team at shift change, please see their note for full H&P.  Briefly patient is here under IVC intoxicated with alcohol.  When she arrived she was combative and required Geodon upon arrival.  On my evaluation patient is sleeping.  With loud voice she will lift her head up is able to tell me her name however rapidly falls back asleep.  She is resting in no obvious distress.  She is resting with pulse ox monitoring in place.  Sitter in room.  Patient is not sober enough for TTS evaluation at this time.  At shift change care was transferred to Quincy Carnes PA-C who will follow pending studies, re-evaulate and determine disposition.      Lorin Glass, PA-C 06/23/19 2341    Gareth Morgan, MD 06/24/19 1531

## 2019-06-23 NOTE — ED Notes (Signed)
[employer] at bedside restraining pt.

## 2019-06-24 ENCOUNTER — Encounter (HOSPITAL_COMMUNITY): Payer: Self-pay | Admitting: Clinical

## 2019-06-24 NOTE — ED Notes (Signed)
Pt's lunch arrived 

## 2019-06-24 NOTE — ED Notes (Signed)
Pt informed that a urine sample is needed. Will try later.

## 2019-06-24 NOTE — ED Provider Notes (Signed)
  Patient has started to wake up some this morning.  She was able to walk to the bathroom, changed out of her clothes with NT assistance.  Hopefully she can get TTS done this morning.  Morning team will be made aware.   Larene Pickett, PA-C 06/24/19 4827    Fatima Blank, MD 06/24/19 [phone]

## 2019-06-24 NOTE — ED Notes (Signed)
Please have pt call Terrill Mohr [phone]

## 2019-06-24 NOTE — ED Notes (Signed)
Pt given water, per Luellen Pucker - RN.

## 2019-06-24 NOTE — ED Notes (Signed)
Pt given urine cup to collect a sample. Pt flushed the toilet, but came out of the bathroom without a sample. Pt asked why she did not provide a sample. Pt stated that she "could not go" to the bathroom. Pt asked for a cup of water.

## 2019-06-24 NOTE — [facility] Counselor (Signed)
Per Ricky Ala, NP patient does not meet in patient criteria. Patient d/c with substance use treatment resources. Patient's RN, Luellen Pucker informed of disposition.

## 2019-06-24 NOTE — ED Notes (Signed)
Gave pt ice water, per Audrey - RN. 

## 2019-06-24 NOTE — ED Triage Notes (Signed)
PT refused to give Urine sample

## 2019-06-24 NOTE — [facility] Counselor (Signed)
Per Luellen Pucker RN patient continues to sleep. Her BAL was 400 upon arrival and she was sedated with Geodon.

## 2019-06-24 NOTE — ED Notes (Signed)
Pt's breakfast tray arrived 

## 2019-06-24 NOTE — ED Provider Notes (Signed)
Emergency Medicine Observation Re-evaluation Note  Allison [name] is a 48 y.o. female, seen on rounds today.  Pt initially presented to the ED for complaints of IVC Pt is under IVC; initially presented intoxicated with alcohol; she became combative in the ED and required Geodon prior to medical clearance. Currently, the patient is sitting comfortably in bed.  Physical Exam  BP (!) 176/89 (BP Location: Left Arm)   Pulse 89   Temp 98.3 F (36.8 C) (Oral)   Resp 18   SpO2 92%  Physical Exam Constitutional:      Appearance: Normal appearance.  Pulmonary:     Effort: Pulmonary effort is normal.  Neurological:     Mental Status: She is alert.     ED Course / MDM  EKG:    I have reviewed the labs performed to date as well as medications administered while in observation.  Recent changes in the last 24 hours include N/A; no change since being medically cleared in the ED. Plan  Current plan is for TTS to evaluate. They were unable to do so earlier as patient was still sleeping. Patient is under full IVC at this time.   Allison Maize, PA-C 06/24/19 [place], MD 06/25/19 0800

## 2019-06-24 NOTE — ED Notes (Signed)
Informed by the sitter that the pt was tearful. This Tech spoke to the pt and the pt asked, "How long do I have to stay here?" Pt informed that each pt's situation is different and that this Tech was unsure. Tech informed pt that Luellen Pucker - RN will be informed.

## 2019-06-24 NOTE — BH Assessment (Signed)
Tele Assessment Note   Patient Name: Allison [name] MRN: [id number] Referring Physician: Wendall Mola of Patient: [facility] ED Location of Provider: [place] is an 48 y.o. female presenting to [facility] ED under IVC. Per EDP: "She comes in today in the care of the police officers after her husband took her to the jail to involuntarily commit her.  She was committed because of increased amounts of combativeness at home, she has been agitated, she has been threatening her children, threatening to hurt herself and tried to jump out of the car when the husband was driving her to the jail.  The patient is yelling, combative, she tried to punch a Engineer, structural."  Upon arrival patient's BAL was 456, she was combative and required sedation. TTS was not able to see her until the following afternoon. Upon this clinician's exam patient is calm and cooperative. When asked why she is in the hospital she states "I had too much to drink yesterday." She states she does not recall coming to the hospital but remembers being at her brother's house drinking yesterday. She denies SI/HI/AVH. Patient states she is currently treated for depression and anxiety with medication from her PCP. Patient does not have a psychiatrist or outpatient therapist. Patient states she believes she would benefit from outpatient therapy and states she has considered going to [place]. Patient denies any substance use other than alcohol. Patient was hospitalized at [facility] on 06/10/2019 due to alcohol abuse and "attempting to jump in front of cars." Patient states she has never been suicidal. Patient gave verbal consent for TTS to contact her husband, Rogelio Seen [phone]).  Per collateral: Patient keeps "wandering off" and when she gets to "wherever" she starts drinking. He states he does not recall the last time he has sen her sober. While under the influence of alcohol she reports to husband she wants to hurt  herself and threatens their children, ages 85, 79, 59. He states as soon she as she discharged from [facility] 2 weeks ago she began drinking. He believes patient would benefit from a longer term treatment for her alcohol use.   Patient is alert and oriented x 4. She is calm and cooperative. She is dressed in scrubs sitting up in bed. Her speech is logical, eye contact is good, and thoughts are organized. Patient's mood is pleasant and affect is congruent. Patient's insight, judgement, and impulse control are poor. Patient does not appear to be responding to internal stimuli or experiencing delusional thought content.  Disposition: Per Ricky Ala, NP patient does not meet in patient criteria. Patient d/c with substance use treatment resources.  Diagnosis: F33.2 MDD, recurrent, severe   F10.20 Alcohol use disorder, severe  Past Medical History:  Past Medical History:  Diagnosis Date  . GERD (gastroesophageal reflux disease)   . Hypertension     History reviewed. No pertinent surgical history.  Family History:  Family History  Problem Relation Age of Onset  . Hypertension Mother   . Diabetes Mother   . Hypertension Father     Social History:  reports that she has never smoked. She has never used smokeless tobacco. She reports current alcohol use of about 14.0 standard drinks of alcohol per week. She reports current drug use. Drug: Marijuana.  Additional Social History:  Alcohol / Drug Use Pain Medications: see MAR Prescriptions: see MAR Over the Counter: see MAR History of alcohol / drug use?: Yes Substance #1 Name of Substance 1: Alcohol 1 -  Age of First Use: teens 1 - Amount (size/oz): varies 1 - Frequency: daily 1 - Duration: "years" 1 - Last Use / Amount: 06/23/2019- 6 shots, a bottle of wine, and some beers  CIWA: CIWA-Ar BP: (!) 150/70 Pulse Rate: 95 Nausea and Vomiting: no nausea and no vomiting Tactile Disturbances: none Tremor: no tremor Auditory Disturbances: not  present Paroxysmal Sweats: no sweat visible Visual Disturbances: not present Anxiety: no anxiety, at ease Headache, Fullness in Head: none present Agitation: normal activity Orientation and Clouding of Sensorium: oriented and can do serial additions CIWA-Ar Total: 0 COWS:    Allergies:  Allergies  Allergen Reactions  . Lisinopril Cough    Home Medications: (Not in a hospital admission)   OB/GYN Status:  No LMP recorded.  General Assessment Data Assessment unable to be completed: Yes Reason for not completing assessment: pt combative and being chemically restrained with Geodon Location of Assessment: [facility] ED TTS Assessment: In system Is this a Tele or Face-to-Face Assessment?: Tele Assessment Is this an Initial Assessment or a Re-assessment for this encounter?: Initial Assessment Patient Accompanied by:: N/A Language Other than English: No Living Arrangements: Other (Comment) What gender do you identify as?: Female Marital status: Married ArtistMaiden name: Diona BrownerMcDowell Pregnancy Status: No Living Arrangements: Spouse/significant other Can pt return to current living arrangement?: Yes Admission Status: Involuntary Petitioner: Family member Is patient capable of signing voluntary admission?: No Referral Source: Self/Family/Friend Insurance type: None     Crisis Care Plan Living Arrangements: Spouse/significant other Legal Guardian: (self) Name of Psychiatrist: None Name of Therapist: None  Education Status Is patient currently in school?: No Highest grade of school patient has completed: High school diploma and [employer] Is the patient employed, unemployed or receiving disability?: Unemployed  Risk to self with the past 6 months Suicidal Ideation: No-Not Currently/Within Last 6 Months Has patient been a risk to self within the past 6 months prior to admission? : Yes Suicidal Intent: No Has patient had any suicidal intent within the past 6 months prior to  admission? : No Is patient at risk for suicide?: No Suicidal Plan?: No Has patient had any suicidal plan within the past 6 months prior to admission? : No Specify Current Suicidal Plan: none Access to Means: No Specify Access to Suicidal Means: none What has been your use of drugs/alcohol within the last 12 months?: daily alcohol use Previous Attempts/Gestures: No How many times?: 0 Other Self Harm Risks: alcohol use Triggers for Past Attempts: None known Intentional Self Injurious Behavior: None Family Suicide History: No Recent stressful life event(s): (none noted) Persecutory voices/beliefs?: No Depression: Yes Depression Symptoms: Despondent, Insomnia, Tearfulness, Isolating, Fatigue, Guilt, Feeling worthless/self pity, Loss of interest in usual pleasures, Feeling angry/irritable Substance abuse history and/or treatment for substance abuse?: No Suicide prevention information given to non-admitted patients: Not applicable  Risk to Others within the past 6 months Homicidal Ideation: No-Not Currently/Within Last 6 Months Does patient have any lifetime risk of violence toward others beyond the six months prior to admission? : No Thoughts of Harm to Others: No Current Homicidal Intent: No Current Homicidal Plan: No Access to Homicidal Means: No Identified Victim: none History of harm to others?: No Assessment of Violence: None Noted Violent Behavior Description: none ntoed Does patient have access to weapons?: No Criminal Charges Pending?: No Does patient have a court date: No Is patient on probation?: No  Psychosis Hallucinations: None noted Delusions: None noted  Mental Status Report Appearance/Hygiene: In scrubs, Disheveled Eye Contact: Good  Motor Activity: Freedom of movement Speech: Logical/coherent Level of Consciousness: Alert Mood: Pleasant Affect: Apprehensive Anxiety Level: Minimal Thought Processes: Coherent, Irrelevant Judgement: Partial Orientation:  Person, Place, Time, Situation Obsessive Compulsive Thoughts/Behaviors: None  Cognitive Functioning Concentration: Normal Memory: Remote Intact, Recent Impaired Is patient IDD: No Insight: Poor Impulse Control: Poor Appetite: Good Have you had any weight changes? : Loss Amount of the weight change? (lbs): 20 lbs Sleep: Unable to Assess Total Hours of Sleep: (UTA) Vegetative Symptoms: None  ADLScreening [facility] Assessment Services) Patient's cognitive ability adequate to safely complete daily activities?: Yes Patient able to express need for assistance with ADLs?: No Independently performs ADLs?: Yes (appropriate for developmental age)  Prior Inpatient Therapy Prior Inpatient Therapy: Yes Prior Therapy Dates: Multiple; once in 2019 and earlier in 2020 Prior Therapy [facility]): [facility] Reason for Treatment: SI, SA  Prior Outpatient Therapy Prior Outpatient Therapy: No Does patient have an ACCT team?: No Does patient have Intensive In-House Services?  : No Does patient have [facility] services? : No Does patient have P4CC services?: No  ADL Screening (condition at time of admission) Patient's cognitive ability adequate to safely complete daily activities?: Yes Is the patient deaf or have difficulty hearing?: No Does the patient have difficulty seeing, even when wearing glasses/contacts?: No Does the patient have difficulty concentrating, remembering, or making decisions?: No Patient able to express need for assistance with ADLs?: No Does the patient have difficulty dressing or bathing?: No Independently performs ADLs?: Yes (appropriate for developmental age) Does the patient have difficulty walking or climbing stairs?: No Weakness of Legs: None Weakness of Arms/Hands: None  Home Assistive Devices/Equipment Home Assistive Devices/Equipment: None  Therapy Consults (therapy consults require a physician order) PT Evaluation Needed: No OT  Evalulation Needed: No SLP Evaluation Needed: No Abuse/Neglect Assessment (Assessment to be complete while patient is alone) Physical Abuse: Denies Verbal Abuse: Denies Sexual Abuse: Denies Exploitation of patient/patient's resources: Denies Self-Neglect: Denies Values / Beliefs Cultural Requests During Hospitalization: None Spiritual Requests During Hospitalization: Other (comment)(Pt would like a Bible) Consults Spiritual Care Consult Needed: No Social Work Consult Needed: No Merchant navy officerAdvance Directives (For Healthcare) Does Patient Have a Medical Advance Directive?: No Would patient like information on creating a medical advance directive?: No - Patient declined          Disposition: Per Hillery Jacksanika Lewis, NP patient does not meet in patient criteria. Patient d/c with substance use treatment resources. Disposition Initial Assessment Completed for this Encounter: Yes Patient referred to: Other (Comment)  This service was provided via telemedicine using a 2-way, interactive audio and video technology.  Names of all persons participating in this telemedicine service and their role in this encounter. Name: Doreene Elandracy Leyh Role: patient  Name: Celedonio MiyamotoMeredith Hiram Mciver, LCSW Role: TTS  Name:  Role:   Name:  Role:     Celedonio MiyamotoMeredith  Elpidia Karn 06/24/2019 4:29 PM

## 2019-06-24 NOTE — ED Provider Notes (Signed)
Psychiatry cleared the patient.  Discharged from ED in good condition   Lennice Sites, DO 06/24/19 1724

## 2019-06-24 NOTE — ED Triage Notes (Signed)
TC to [facility]  To report Pt awake and ready to talk with TTS

## 2019-07-10 ENCOUNTER — Emergency Department (HOSPITAL_COMMUNITY)
Admission: EM | Admit: 2019-07-10 | Discharge: 2019-07-10 | Disposition: A | Discharge status: Home / Self Care | Payer: Self-pay | Attending: Emergency Medicine | Admitting: Emergency Medicine

## 2019-07-10 ENCOUNTER — Encounter (HOSPITAL_COMMUNITY): Payer: Self-pay | Admitting: Emergency Medicine

## 2019-07-10 ENCOUNTER — Other Ambulatory Visit: Payer: Self-pay

## 2019-07-10 DIAGNOSIS — R45851 Suicidal ideations: Secondary | ICD-10-CM | POA: Insufficient documentation

## 2019-07-10 DIAGNOSIS — F1020 Alcohol dependence, uncomplicated: Secondary | ICD-10-CM | POA: Insufficient documentation

## 2019-07-10 DIAGNOSIS — Z008 Encounter for other general examination: Secondary | ICD-10-CM | POA: Insufficient documentation

## 2019-07-10 DIAGNOSIS — Z5321 Procedure and treatment not carried out due to patient leaving prior to being seen by health care provider: Secondary | ICD-10-CM | POA: Insufficient documentation

## 2019-07-10 LAB — COMPREHENSIVE METABOLIC PANEL
ALT: 16 U/L (ref 0–44)
AST: 60 U/L — ABNORMAL HIGH (ref 15–41)
Albumin: 2.5 g/dL — ABNORMAL LOW (ref 3.5–5.0)
Alkaline Phosphatase: 168 U/L — ABNORMAL HIGH (ref 38–126)
Anion gap: 9 (ref 5–15)
BUN: <5 mg/dL — ABNORMAL LOW (ref 6–20)
CO2: 24 mmol/L (ref 22–32)
Calcium: 8 mg/dL — ABNORMAL LOW (ref 8.9–10.3)
Chloride: 112 mmol/L — ABNORMAL HIGH (ref 98–111)
Creatinine, Ser: 0.75 mg/dL (ref 0.44–1.00)
GFR calc Af Amer: >60 mL/min (ref >60)
GFR calc non Af Amer: >60 mL/min (ref >60)
Glucose, Bld: 85 mg/dL (ref 70–99)
Potassium: 3.6 mmol/L (ref 3.5–5.1)
Sodium: 145 mmol/L (ref 135–145)
Total Bilirubin: 1.5 mg/dL — ABNORMAL HIGH (ref 0.3–1.2)
Total Protein: 8.2 g/dL — ABNORMAL HIGH (ref 6.5–8.1)

## 2019-07-10 LAB — CBC
HCT: 27.3 % — ABNORMAL LOW (ref 36.0–46.0)
Hemoglobin: 8.4 g/dL — ABNORMAL LOW (ref 12.0–15.0)
MCH: 22.7 pg — ABNORMAL LOW (ref 26.0–34.0)
MCHC: 30.8 g/dL (ref 30.0–36.0)
MCV: 73.8 fL — ABNORMAL LOW (ref 80.0–100.0)
Platelets: 317 10*3/uL (ref 150–400)
RBC: 3.7 MIL/uL — ABNORMAL LOW (ref 3.87–5.11)
RDW: 18.7 % — ABNORMAL HIGH (ref 11.5–15.5)
WBC: 4.1 10*3/uL (ref 4.0–10.5)
nRBC: 0 % (ref 0.0–0.2)

## 2019-07-10 LAB — ETHANOL: Alcohol, Ethyl (B): 256 mg/dL — ABNORMAL HIGH (ref <10)

## 2019-07-10 LAB — I-STAT BETA HCG BLOOD, ED (MC, WL, AP ONLY): I-stat hCG, quantitative: <5 m[IU]/mL (ref <5)

## 2019-07-10 NOTE — ED Notes (Signed)
Husband did call back he was informed that Allison [name] eloped. He states Allison [name] has done this before and he feels at this time he cant do anything to help her because he has tried. I did tell husband if he did witnessed concerning behaviors by pt he may IVC patient but he refuses at this time. Husband has IVC'ed pt before and he states "it doesn't help Allison [name] just keeps doing to same thing and doesn;t want any help".

## 2019-07-10 NOTE — ED Notes (Signed)
Pt was sitting in lobby awaiting a room. When first nurse had a room ready for this patient Allison [name] could not find this patient in the lobby. The patient was here on a voluntary basis and [employer] had already left her. Will call pt to see where she is and if she wishes to still be seen. At this time it is assumed that pt eloped without telling anyone.

## 2019-07-10 NOTE — ED Notes (Signed)
Last drink was last night at 11pm per pt

## 2019-07-10 NOTE — ED Notes (Addendum)
Husband called to inform but unable to get on phone and has a voicemail box that hasn't been setup. Pt denied all SI and hI and not IVC'ed.

## 2019-07-10 NOTE — ED Triage Notes (Signed)
Pt was brought in with [employer] on a voluntary basis. Pt reports drinking at least a bottle of wine every day "sometimes more'. Per [employer] the patient became intoxicated yesterday and was talking to herself and made a statement she wanted to hurt herself. Per patient she is not having any SI or HI thoughts. Pt states she came voluntary to the hospital due to wanting help to stop drinking so much.    Per [employer] pt made a post on social media threatening to hurt herself. Pt denies remembering this and keeps repeating she was just drunk.

## 2019-07-11 ENCOUNTER — Emergency Department (HOSPITAL_COMMUNITY)
Admission: EM | Admit: 2019-07-11 | Discharge: 2019-07-12 | Disposition: A | Discharge status: Home / Self Care | Payer: Self-pay | Attending: Emergency Medicine | Admitting: Emergency Medicine

## 2019-07-11 ENCOUNTER — Other Ambulatory Visit: Payer: Self-pay

## 2019-07-11 DIAGNOSIS — Z79899 Other long term (current) drug therapy: Secondary | ICD-10-CM | POA: Insufficient documentation

## 2019-07-11 DIAGNOSIS — Y908 Blood alcohol level of 240 mg/100 ml or more: Secondary | ICD-10-CM | POA: Insufficient documentation

## 2019-07-11 DIAGNOSIS — F10920 Alcohol use, unspecified with intoxication, uncomplicated: Secondary | ICD-10-CM | POA: Insufficient documentation

## 2019-07-11 DIAGNOSIS — I10 Essential (primary) hypertension: Secondary | ICD-10-CM | POA: Insufficient documentation

## 2019-07-11 LAB — URINALYSIS, ROUTINE W REFLEX MICROSCOPIC
Bilirubin Urine: NEGATIVE
Glucose, UA: NEGATIVE mg/dL
Hgb urine dipstick: NEGATIVE
Ketones, ur: NEGATIVE mg/dL
Leukocytes,Ua: NEGATIVE
Nitrite: NEGATIVE
Protein, ur: NEGATIVE mg/dL
Specific Gravity, Urine: 1.002 — ABNORMAL LOW (ref 1.005–1.030)
pH: 6 (ref 5.0–8.0)

## 2019-07-11 LAB — COMPREHENSIVE METABOLIC PANEL
ALT: 21 U/L (ref 0–44)
AST: 57 U/L — ABNORMAL HIGH (ref 15–41)
Albumin: 2.8 g/dL — ABNORMAL LOW (ref 3.5–5.0)
Alkaline Phosphatase: 174 U/L — ABNORMAL HIGH (ref 38–126)
Anion gap: 9 (ref 5–15)
BUN: <5 mg/dL — ABNORMAL LOW (ref 6–20)
CO2: 23 mmol/L (ref 22–32)
Calcium: 8 mg/dL — ABNORMAL LOW (ref 8.9–10.3)
Chloride: 111 mmol/L (ref 98–111)
Creatinine, Ser: 0.89 mg/dL (ref 0.44–1.00)
GFR calc Af Amer: >60 mL/min (ref >60)
GFR calc non Af Amer: >60 mL/min (ref >60)
Glucose, Bld: 107 mg/dL — ABNORMAL HIGH (ref 70–99)
Potassium: 3.1 mmol/L — ABNORMAL LOW (ref 3.5–5.1)
Sodium: 143 mmol/L (ref 135–145)
Total Bilirubin: 1.2 mg/dL (ref 0.3–1.2)
Total Protein: 8 g/dL (ref 6.5–8.1)

## 2019-07-11 LAB — RAPID URINE DRUG SCREEN, HOSP PERFORMED
Amphetamines: NOT DETECTED
Barbiturates: NOT DETECTED
Benzodiazepines: NOT DETECTED
Cocaine: NOT DETECTED
Opiates: NOT DETECTED
Tetrahydrocannabinol: NOT DETECTED

## 2019-07-11 LAB — CBC WITH DIFFERENTIAL/PLATELET
Abs Immature Granulocytes: 0.04 10*3/uL (ref 0.00–0.07)
Basophils Absolute: 0.1 10*3/uL (ref 0.0–0.1)
Basophils Relative: 2 %
Eosinophils Absolute: 0.1 10*3/uL (ref 0.0–0.5)
Eosinophils Relative: 1 %
HCT: 29.5 % — ABNORMAL LOW (ref 36.0–46.0)
Hemoglobin: 8.8 g/dL — ABNORMAL LOW (ref 12.0–15.0)
Immature Granulocytes: 1 %
Lymphocytes Relative: 42 %
Lymphs Abs: 2.6 10*3/uL (ref 0.7–4.0)
MCH: 22.3 pg — ABNORMAL LOW (ref 26.0–34.0)
MCHC: 29.8 g/dL — ABNORMAL LOW (ref 30.0–36.0)
MCV: 74.9 fL — ABNORMAL LOW (ref 80.0–100.0)
Monocytes Absolute: 0.4 10*3/uL (ref 0.1–1.0)
Monocytes Relative: 7 %
Neutro Abs: 2.9 10*3/uL (ref 1.7–7.7)
Neutrophils Relative %: 47 %
Platelets: 279 10*3/uL (ref 150–400)
RBC: 3.94 MIL/uL (ref 3.87–5.11)
RDW: 18.7 % — ABNORMAL HIGH (ref 11.5–15.5)
WBC: 6.1 10*3/uL (ref 4.0–10.5)
nRBC: 0 % (ref 0.0–0.2)

## 2019-07-11 LAB — I-STAT BETA HCG BLOOD, ED (MC, WL, AP ONLY): I-stat hCG, quantitative: <5 m[IU]/mL (ref <5)

## 2019-07-11 LAB — PROTIME-INR
INR: 1.3 — ABNORMAL HIGH (ref 0.8–1.2)
Prothrombin Time: 15.6 seconds — ABNORMAL HIGH (ref 11.4–15.2)

## 2019-07-11 LAB — CBG MONITORING, ED: Glucose-Capillary: 113 mg/dL — ABNORMAL HIGH (ref 70–99)

## 2019-07-11 LAB — ETHANOL: Alcohol, Ethyl (B): 515 mg/dL (ref <10)

## 2019-07-11 LAB — AMMONIA: Ammonia: 31 umol/L (ref 9–35)

## 2019-07-11 LAB — APTT: aPTT: 39 seconds — ABNORMAL HIGH (ref 24–36)

## 2019-07-11 MED ORDER — POTASSIUM CHLORIDE 10 MEQ/100ML IV SOLN
10.0000 meq | Freq: Once | INTRAVENOUS | Status: AC
  Administered 2019-07-11: 10 meq via INTRAVENOUS
  Filled 2019-07-11: qty 100

## 2019-07-11 NOTE — ED Notes (Signed)
CRITICAL VALUE STICKER  CRITICAL VALUE: Dudley Major (on-site recipient of call): Lexander Tremblay RN  [place] NOTIFIED: 07/11/19 8:57 PM   MESSENGER (representative from lab): Ubaldo Glassing  MD NOTIFIED: Zenia Resides  TIME OF NOTIFICATION: 8:58 PM   RESPONSE: Awaiting further orders

## 2019-07-11 NOTE — ED Provider Notes (Signed)
[facility] DEPT Provider Note   CSN: [id number] Arrival date & time: 07/11/19  2202     History   Chief Complaint Chief Complaint  Patient presents with  . Alcohol Intoxication    HPI Allison [name] is a 48 y.o. female.     48 year old female with history of alcohol abuse presents via family due to intoxication.  Patient herself is only arousable to deep stimuli however she is protecting her airway.  Review the old chart shows that she was seen in [facility] yesterday but eloped.  No other history obtainable     Past Medical History:  Diagnosis Date  . GERD (gastroesophageal reflux disease)   . Hypertension     Patient Active Problem List   Diagnosis Date Noted  . Bipolar disorder, unspecified ([place]) 06/10/2019  . Anemia 08/19/2017  . Acute pyelonephritis 05/22/2016  . Pyelonephritis 05/22/2016  . Hyperglycemia 05/22/2016  . Sepsis ([place]) 05/22/2016  . Hypertension 05/22/2016    No past surgical history on file.   OB History   No obstetric history on file.      Home Medications    Prior to Admission medications   Medication Sig Start Date End Date Taking? Authorizing Provider  carvedilol (COREG) 12.5 MG tablet Take 1 tablet (12.5 mg total) by mouth 2 (two) times daily with a meal. 06/13/19   Connye Burkitt, NP  ferrous sulfate 325 (65 FE) MG tablet Take 1 tablet (325 mg total) by mouth daily with breakfast. 06/13/19   Connye Burkitt, NP  folic acid (FOLVITE) 1 MG tablet Take 1 tablet (1 mg total) by mouth daily. 06/13/19   Connye Burkitt, NP  losartan (COZAAR) 50 MG tablet Take 1 tablet (50 mg total) by mouth daily. 06/13/19   Connye Burkitt, NP  pantoprazole (PROTONIX) 40 MG tablet Take 1 tablet (40 mg total) by mouth daily. 06/14/19   Connye Burkitt, NP  thiamine (VITAMIN B-1) 100 MG tablet Take 1 tablet (100 mg total) by mouth daily. 06/13/19   Connye Burkitt, NP    Family History Family History  Problem Relation Age  of Onset  . Hypertension Mother   . Diabetes Mother   . Hypertension Father     Social History Social History   Tobacco Use  . Smoking status: Never Smoker  . Smokeless tobacco: Never Used  Substance Use Topics  . Alcohol use: Yes    Alcohol/week: 14.0 standard drinks    Types: 14 Standard drinks or equivalent per week    Comment: pint of liquor  . Drug use: Yes    Types: Marijuana     Allergies   Lisinopril   Review of Systems Review of Systems  Unable to perform ROS: Psychiatric disorder     Physical Exam Updated Vital Signs BP (!) 146/91   Pulse 91   Temp 98.7 F (37.1 C)   Resp (!) 22   SpO2 100%   Physical Exam Vitals signs and nursing note reviewed.  Constitutional:      General: She is not in acute distress.    Appearance: Normal appearance. She is well-developed. She is not toxic-appearing.  HENT:     Head: Normocephalic and atraumatic.  Eyes:     General: Lids are normal.     Conjunctiva/sclera: Conjunctivae normal.     Pupils: Pupils are equal, round, and reactive to light.  Neck:     Musculoskeletal: Normal range of motion and neck supple.  Thyroid: No thyroid mass.     Trachea: No tracheal deviation.  Cardiovascular:     Rate and Rhythm: Normal rate and regular rhythm.     Heart sounds: Normal heart sounds. No murmur. No gallop.   Pulmonary:     Effort: Pulmonary effort is normal. No respiratory distress.     Breath sounds: Normal breath sounds. No stridor. No decreased breath sounds, wheezing, rhonchi or rales.  Abdominal:     General: Bowel sounds are normal. There is no distension.     Palpations: Abdomen is soft.     Tenderness: There is no abdominal tenderness. There is no rebound.  Musculoskeletal: Normal range of motion.        General: No tenderness.  Skin:    General: Skin is warm and dry.     Findings: No abrasion or rash.  Neurological:     Mental Status: She is lethargic and disoriented.     GCS: GCS eye subscore is  4. GCS verbal subscore is 5. GCS motor subscore is 6.     Cranial Nerves: No cranial nerve deficit.     Sensory: No sensory deficit.     Motor: No tremor.  Psychiatric:        Speech: Speech normal.        Behavior: Behavior normal.      ED Treatments / Results  Labs (all labs ordered are listed, but only abnormal results are displayed) Labs Reviewed  ETHANOL  RAPID URINE DRUG SCREEN, HOSP PERFORMED  CBC WITH DIFFERENTIAL/PLATELET  COMPREHENSIVE METABOLIC PANEL  PROTIME-INR  APTT  AMMONIA  I-STAT BETA HCG BLOOD, ED ([facility], [facility], [facility] ONLY)    EKG None  Radiology No results found.  Procedures Procedures (including critical care time)  Medications Ordered in ED Medications - No data to display   Initial Impression / Assessment and Plan / ED Course  I have reviewed the triage vital signs and the nursing notes.  Pertinent labs & imaging results that were available during my care of the patient were reviewed by me and considered in my medical decision making (see chart for details).        Patient able to protect her airway here.  Does have hypokalemia that was treated with IV potassium.  Severely elevated.  Will be allowed to metabolize.  Will sign out to next provider  Final Clinical Impressions(s) / ED Diagnoses   Final diagnoses:  None    ED Discharge Orders    None       Lorre NickAllen, Shaili Donalson, MD 07/11/19 2114

## 2019-07-11 NOTE — ED Notes (Signed)
Pt was found crawling out of the bed, and had removed her IV. Pt was stating that she needed to go to the bathroom and that she was going to go. PT was again informed that she could not leave the bed due to her intoxication and risk of falls. The bedpan, and purewick was offered to the pt to relieve herself. Pt refused both stating that she was going to the bathroom. PT was then informed that if she continues to try and get out of bed a Posey belt would be used to restrain her to the bed. Pt was asked if there was any other needs she had. Pt asked for a cup of water. New IV was placed and a cup of ice water was given to the pt. Pt still continues to refuse bedpan, and purewick at this time.

## 2019-07-11 NOTE — ED Notes (Signed)
Pt was brought in by family, stated that the pt was drinking. Hx of ETOH use, family also stated she may have liver failure. Pt mostly unresponsive upon arrival, however after moving pt into bed pt became alert to voice, and was able answer basic questions.

## 2019-07-12 NOTE — ED Notes (Signed)
While going to pickup discharge paperwork pt left room, and went into the parking lot. Pt was escorted back inside and restrained to chair for her safety, provider aware. Trying to find a responsible party to transport pt home.

## 2019-07-12 NOTE — ED Notes (Signed)
Pt ambulated to BR with x1 staff member, cleaned herself after having incontinent episode. Pt ambulated back to bed and given warm blankets. Pt unsteady without x1 assist.

## 2019-07-12 NOTE — ED Notes (Signed)
Pt ambulated in dept with no assist. Steady gait

## 2019-07-13 LAB — URINE CULTURE: Culture: NO GROWTH

## 2019-11-15 ENCOUNTER — Other Ambulatory Visit: Payer: Self-pay

## 2019-11-15 DIAGNOSIS — Z20822 Contact with and (suspected) exposure to covid-19: Secondary | ICD-10-CM

## 2019-11-18 LAB — NOVEL CORONAVIRUS, NAA: SARS-CoV-2, NAA: NOT DETECTED

## 2020-01-29 ENCOUNTER — Encounter (HOSPITAL_COMMUNITY): Payer: Self-pay | Admitting: Emergency Medicine

## 2020-01-29 ENCOUNTER — Emergency Department (HOSPITAL_COMMUNITY): Payer: Self-pay

## 2020-01-29 ENCOUNTER — Other Ambulatory Visit: Payer: Self-pay

## 2020-01-29 ENCOUNTER — Inpatient Hospital Stay (HOSPITAL_COMMUNITY)
Admission: EM | Admit: 2020-01-29 | Discharge: 2020-02-01 | DRG: 872 | Disposition: A | Discharge status: Home / Self Care | Payer: Self-pay | Attending: Internal Medicine | Admitting: Internal Medicine

## 2020-01-29 DIAGNOSIS — K760 Fatty (change of) liver, not elsewhere classified: Secondary | ICD-10-CM | POA: Diagnosis present

## 2020-01-29 DIAGNOSIS — D6959 Other secondary thrombocytopenia: Secondary | ICD-10-CM | POA: Diagnosis present

## 2020-01-29 DIAGNOSIS — D696 Thrombocytopenia, unspecified: Secondary | ICD-10-CM | POA: Diagnosis present

## 2020-01-29 DIAGNOSIS — Z888 Allergy status to other drugs, medicaments and biological substances status: Secondary | ICD-10-CM

## 2020-01-29 DIAGNOSIS — F10239 Alcohol dependence with withdrawal, unspecified: Secondary | ICD-10-CM | POA: Diagnosis present

## 2020-01-29 DIAGNOSIS — E876 Hypokalemia: Secondary | ICD-10-CM | POA: Diagnosis present

## 2020-01-29 DIAGNOSIS — S2232XA Fracture of one rib, left side, initial encounter for closed fracture: Secondary | ICD-10-CM | POA: Diagnosis present

## 2020-01-29 DIAGNOSIS — F10939 Alcohol use, unspecified with withdrawal, unspecified: Secondary | ICD-10-CM | POA: Diagnosis present

## 2020-01-29 DIAGNOSIS — K529 Noninfective gastroenteritis and colitis, unspecified: Secondary | ICD-10-CM | POA: Diagnosis present

## 2020-01-29 DIAGNOSIS — W19XXXA Unspecified fall, initial encounter: Secondary | ICD-10-CM | POA: Diagnosis present

## 2020-01-29 DIAGNOSIS — N920 Excessive and frequent menstruation with regular cycle: Secondary | ICD-10-CM | POA: Diagnosis present

## 2020-01-29 DIAGNOSIS — Z8249 Family history of ischemic heart disease and other diseases of the circulatory system: Secondary | ICD-10-CM

## 2020-01-29 DIAGNOSIS — N179 Acute kidney failure, unspecified: Secondary | ICD-10-CM | POA: Diagnosis present

## 2020-01-29 DIAGNOSIS — E860 Dehydration: Secondary | ICD-10-CM | POA: Diagnosis present

## 2020-01-29 DIAGNOSIS — K8020 Calculus of gallbladder without cholecystitis without obstruction: Secondary | ICD-10-CM | POA: Diagnosis present

## 2020-01-29 DIAGNOSIS — I129 Hypertensive chronic kidney disease with stage 1 through stage 4 chronic kidney disease, or unspecified chronic kidney disease: Secondary | ICD-10-CM | POA: Diagnosis present

## 2020-01-29 DIAGNOSIS — R161 Splenomegaly, not elsewhere classified: Secondary | ICD-10-CM | POA: Diagnosis present

## 2020-01-29 DIAGNOSIS — D631 Anemia in chronic kidney disease: Secondary | ICD-10-CM | POA: Diagnosis present

## 2020-01-29 DIAGNOSIS — E872 Acidosis, unspecified: Secondary | ICD-10-CM | POA: Diagnosis present

## 2020-01-29 DIAGNOSIS — Z20822 Contact with and (suspected) exposure to covid-19: Secondary | ICD-10-CM | POA: Diagnosis present

## 2020-01-29 DIAGNOSIS — D649 Anemia, unspecified: Secondary | ICD-10-CM

## 2020-01-29 DIAGNOSIS — K219 Gastro-esophageal reflux disease without esophagitis: Secondary | ICD-10-CM | POA: Diagnosis present

## 2020-01-29 DIAGNOSIS — D509 Iron deficiency anemia, unspecified: Secondary | ICD-10-CM | POA: Diagnosis present

## 2020-01-29 DIAGNOSIS — Z79899 Other long term (current) drug therapy: Secondary | ICD-10-CM

## 2020-01-29 DIAGNOSIS — D62 Acute posthemorrhagic anemia: Secondary | ICD-10-CM | POA: Diagnosis present

## 2020-01-29 DIAGNOSIS — A419 Sepsis, unspecified organism: Principal | ICD-10-CM | POA: Diagnosis present

## 2020-01-29 DIAGNOSIS — Z91128 Patient's intentional underdosing of medication regimen for other reason: Secondary | ICD-10-CM

## 2020-01-29 DIAGNOSIS — Z833 Family history of diabetes mellitus: Secondary | ICD-10-CM

## 2020-01-29 DIAGNOSIS — R7989 Other specified abnormal findings of blood chemistry: Secondary | ICD-10-CM

## 2020-01-29 DIAGNOSIS — F1010 Alcohol abuse, uncomplicated: Secondary | ICD-10-CM

## 2020-01-29 DIAGNOSIS — N138 Other obstructive and reflux uropathy: Secondary | ICD-10-CM | POA: Diagnosis present

## 2020-01-29 DIAGNOSIS — N182 Chronic kidney disease, stage 2 (mild): Secondary | ICD-10-CM | POA: Diagnosis present

## 2020-01-29 DIAGNOSIS — K7010 Alcoholic hepatitis without ascites: Secondary | ICD-10-CM | POA: Diagnosis present

## 2020-01-29 DIAGNOSIS — Y92009 Unspecified place in unspecified non-institutional (private) residence as the place of occurrence of the external cause: Secondary | ICD-10-CM

## 2020-01-29 DIAGNOSIS — R6520 Severe sepsis without septic shock: Secondary | ICD-10-CM

## 2020-01-29 DIAGNOSIS — T454X6A Underdosing of iron and its compounds, initial encounter: Secondary | ICD-10-CM | POA: Diagnosis present

## 2020-01-29 LAB — URINALYSIS, ROUTINE W REFLEX MICROSCOPIC
Bilirubin Urine: NEGATIVE
Glucose, UA: NEGATIVE mg/dL
Hgb urine dipstick: NEGATIVE
Ketones, ur: NEGATIVE mg/dL
Leukocytes,Ua: NEGATIVE
Nitrite: NEGATIVE
Protein, ur: 100 mg/dL — AB
Specific Gravity, Urine: 1.019 (ref 1.005–1.030)
pH: 5 (ref 5.0–8.0)

## 2020-01-29 LAB — COMPREHENSIVE METABOLIC PANEL
ALT: 44 U/L (ref 0–44)
AST: 172 U/L — ABNORMAL HIGH (ref 15–41)
Albumin: 3.1 g/dL — ABNORMAL LOW (ref 3.5–5.0)
Alkaline Phosphatase: 171 U/L — ABNORMAL HIGH (ref 38–126)
Anion gap: 22 — ABNORMAL HIGH (ref 5–15)
BUN: 15 mg/dL (ref 6–20)
CO2: 20 mmol/L — ABNORMAL LOW (ref 22–32)
Calcium: 8.3 mg/dL — ABNORMAL LOW (ref 8.9–10.3)
Chloride: 97 mmol/L — ABNORMAL LOW (ref 98–111)
Creatinine, Ser: 2.38 mg/dL — ABNORMAL HIGH (ref 0.44–1.00)
GFR calc Af Amer: 27 mL/min — ABNORMAL LOW (ref >60)
GFR calc non Af Amer: 23 mL/min — ABNORMAL LOW (ref >60)
Glucose, Bld: 128 mg/dL — ABNORMAL HIGH (ref 70–99)
Potassium: 3.4 mmol/L — ABNORMAL LOW (ref 3.5–5.1)
Sodium: 139 mmol/L (ref 135–145)
Total Bilirubin: 2.2 mg/dL — ABNORMAL HIGH (ref 0.3–1.2)
Total Protein: 7.2 g/dL (ref 6.5–8.1)

## 2020-01-29 LAB — IRON AND TIBC
Iron: 42 ug/dL (ref 28–170)
Saturation Ratios: 13 % (ref 10.4–31.8)
TIBC: 319 ug/dL (ref 250–450)
UIBC: 277 ug/dL

## 2020-01-29 LAB — CBC WITH DIFFERENTIAL/PLATELET
Abs Immature Granulocytes: 0.13 10*3/uL — ABNORMAL HIGH (ref 0.00–0.07)
Basophils Absolute: 0 10*3/uL (ref 0.0–0.1)
Basophils Relative: 0 %
Eosinophils Absolute: 0 10*3/uL (ref 0.0–0.5)
Eosinophils Relative: 0 %
HCT: 18.4 % — ABNORMAL LOW (ref 36.0–46.0)
Hemoglobin: 5.8 g/dL — CL (ref 12.0–15.0)
Immature Granulocytes: 1 %
Lymphocytes Relative: 7 %
Lymphs Abs: 0.9 10*3/uL (ref 0.7–4.0)
MCH: 25.8 pg — ABNORMAL LOW (ref 26.0–34.0)
MCHC: 31.5 g/dL (ref 30.0–36.0)
MCV: 81.8 fL (ref 80.0–100.0)
Monocytes Absolute: 0.7 10*3/uL (ref 0.1–1.0)
Monocytes Relative: 6 %
Neutro Abs: 10.7 10*3/uL — ABNORMAL HIGH (ref 1.7–7.7)
Neutrophils Relative %: 86 %
Platelets: 115 10*3/uL — ABNORMAL LOW (ref 150–400)
RBC: 2.25 MIL/uL — ABNORMAL LOW (ref 3.87–5.11)
RDW: 25.1 % — ABNORMAL HIGH (ref 11.5–15.5)
WBC: 12.5 10*3/uL — ABNORMAL HIGH (ref 4.0–10.5)
nRBC: 0.2 % (ref 0.0–0.2)

## 2020-01-29 LAB — RETICULOCYTES
Immature Retic Fract: 41.1 % — ABNORMAL HIGH (ref 2.3–15.9)
RBC.: 1.59 MIL/uL — ABNORMAL LOW (ref 3.87–5.11)
Retic Count, Absolute: 54.2 10*3/uL (ref 19.0–186.0)
Retic Ct Pct: 3.4 % — ABNORMAL HIGH (ref 0.4–3.1)

## 2020-01-29 LAB — LACTIC ACID, PLASMA: Lactic Acid, Venous: 4.5 mmol/L (ref 0.5–1.9)

## 2020-01-29 LAB — POC OCCULT BLOOD, ED: Fecal Occult Bld: NEGATIVE

## 2020-01-29 LAB — LIPASE, BLOOD: Lipase: 47 U/L (ref 11–51)

## 2020-01-29 LAB — FERRITIN: Ferritin: 23 ng/mL (ref 11–307)

## 2020-01-29 LAB — ETHANOL: Alcohol, Ethyl (B): <10 mg/dL (ref <10)

## 2020-01-29 LAB — MRSA PCR SCREENING: MRSA by PCR: NEGATIVE

## 2020-01-29 LAB — PREPARE RBC (CROSSMATCH)

## 2020-01-29 LAB — VITAMIN B12: Vitamin B-12: 529 pg/mL (ref 180–914)

## 2020-01-29 LAB — SARS CORONAVIRUS 2 (TAT 6-24 HRS): SARS Coronavirus 2: NEGATIVE

## 2020-01-29 LAB — FOLATE: Folate: 4.8 ng/mL — ABNORMAL LOW (ref >5.9)

## 2020-01-29 LAB — ABO/RH: ABO/RH(D): O POS

## 2020-01-29 MED ORDER — ACETAMINOPHEN 325 MG PO TABS
650.0000 mg | ORAL_TABLET | Freq: Four times a day (QID) | ORAL | Status: DC | PRN
  Administered 2020-01-29 – 2020-01-30 (×2): 650 mg via ORAL
  Filled 2020-01-29 (×2): qty 2

## 2020-01-29 MED ORDER — ADULT MULTIVITAMIN W/MINERALS CH
1.0000 | ORAL_TABLET | Freq: Every day | ORAL | Status: DC
  Administered 2020-01-30 – 2020-02-01 (×3): 1 via ORAL
  Filled 2020-01-29 (×3): qty 1

## 2020-01-29 MED ORDER — THIAMINE HCL 100 MG/ML IJ SOLN
Freq: Once | INTRAVENOUS | Status: AC
  Filled 2020-01-29: qty 1000

## 2020-01-29 MED ORDER — SODIUM CHLORIDE 0.9 % IV BOLUS
1000.0000 mL | Freq: Once | INTRAVENOUS | Status: AC
  Administered 2020-01-29: 10:00:00 1000 mL via INTRAVENOUS

## 2020-01-29 MED ORDER — SODIUM CHLORIDE 0.9 % IV SOLN
2.0000 g | INTRAVENOUS | Status: DC
  Administered 2020-01-30 – 2020-01-31 (×2): 2 g via INTRAVENOUS
  Filled 2020-01-29 (×2): qty 20
  Filled 2020-01-29: qty 2

## 2020-01-29 MED ORDER — CARVEDILOL 12.5 MG PO TABS
12.5000 mg | ORAL_TABLET | Freq: Two times a day (BID) | ORAL | Status: DC
  Administered 2020-01-30 – 2020-01-31 (×3): 12.5 mg via ORAL
  Filled 2020-01-29 (×3): qty 1

## 2020-01-29 MED ORDER — LORAZEPAM 1 MG PO TABS: 1.0000 mg | ORAL_TABLET | ORAL | Status: DC | PRN

## 2020-01-29 MED ORDER — ONDANSETRON HCL 4 MG PO TABS: 4.0000 mg | ORAL_TABLET | Freq: Four times a day (QID) | ORAL | Status: DC | PRN

## 2020-01-29 MED ORDER — SODIUM CHLORIDE 0.9 % IV SOLN
2.0000 g | Freq: Once | INTRAVENOUS | Status: AC
  Administered 2020-01-29: 2 g via INTRAVENOUS
  Filled 2020-01-29: qty 20

## 2020-01-29 MED ORDER — LORAZEPAM 2 MG/ML IJ SOLN
1.0000 mg | Freq: Once | INTRAMUSCULAR | Status: AC
  Administered 2020-01-29: 1 mg via INTRAVENOUS
  Filled 2020-01-29: qty 1

## 2020-01-29 MED ORDER — LORAZEPAM 2 MG/ML IJ SOLN: 1.0000 mg | INTRAMUSCULAR | Status: DC | PRN

## 2020-01-29 MED ORDER — LORAZEPAM 2 MG/ML IJ SOLN: 0.0000 mg | Freq: Three times a day (TID) | INTRAMUSCULAR | Status: DC

## 2020-01-29 MED ORDER — SODIUM CHLORIDE 0.9 % IV BOLUS
1000.0000 mL | Freq: Once | INTRAVENOUS | Status: AC
  Administered 2020-01-29: 09:00:00 1000 mL via INTRAVENOUS

## 2020-01-29 MED ORDER — ONDANSETRON HCL 4 MG/2ML IJ SOLN
4.0000 mg | Freq: Once | INTRAMUSCULAR | Status: AC
  Administered 2020-01-29: 10:00:00 4 mg via INTRAVENOUS
  Filled 2020-01-29: qty 2

## 2020-01-29 MED ORDER — METRONIDAZOLE IN NACL 5-0.79 MG/ML-% IV SOLN
500.0000 mg | Freq: Once | INTRAVENOUS | Status: AC
  Administered 2020-01-29: 500 mg via INTRAVENOUS
  Filled 2020-01-29: qty 100

## 2020-01-29 MED ORDER — SODIUM CHLORIDE 0.9% IV SOLUTION: Freq: Once | INTRAVENOUS | Status: AC

## 2020-01-29 MED ORDER — CHLORDIAZEPOXIDE HCL 25 MG PO CAPS
100.0000 mg | ORAL_CAPSULE | Freq: Once | ORAL | Status: AC
  Administered 2020-01-29: 09:00:00 100 mg via ORAL
  Filled 2020-01-29: qty 4

## 2020-01-29 MED ORDER — THIAMINE HCL 100 MG PO TABS
100.0000 mg | ORAL_TABLET | Freq: Once | ORAL | Status: AC
  Administered 2020-01-29: 100 mg via ORAL
  Filled 2020-01-29: qty 1

## 2020-01-29 MED ORDER — METRONIDAZOLE IN NACL 5-0.79 MG/ML-% IV SOLN
500.0000 mg | Freq: Three times a day (TID) | INTRAVENOUS | Status: DC
  Administered 2020-01-30 – 2020-01-31 (×5): 500 mg via INTRAVENOUS
  Filled 2020-01-29 (×5): qty 100

## 2020-01-29 MED ORDER — THIAMINE HCL 100 MG PO TABS
100.0000 mg | ORAL_TABLET | Freq: Every day | ORAL | Status: DC
  Administered 2020-01-30 – 2020-02-01 (×3): 100 mg via ORAL
  Filled 2020-01-29 (×3): qty 1

## 2020-01-29 MED ORDER — THIAMINE HCL 100 MG/ML IJ SOLN: 100.0000 mg | Freq: Every day | INTRAMUSCULAR | Status: DC

## 2020-01-29 MED ORDER — ONDANSETRON HCL 4 MG/2ML IJ SOLN: 4.0000 mg | Freq: Four times a day (QID) | INTRAMUSCULAR | Status: DC | PRN

## 2020-01-29 MED ORDER — FOLIC ACID 1 MG PO TABS
1.0000 mg | ORAL_TABLET | Freq: Every day | ORAL | Status: DC
  Administered 2020-01-30 – 2020-02-01 (×3): 1 mg via ORAL
  Filled 2020-01-29 (×3): qty 1

## 2020-01-29 MED ORDER — MORPHINE SULFATE (PF) 4 MG/ML IV SOLN
4.0000 mg | Freq: Once | INTRAVENOUS | Status: AC
  Administered 2020-01-29: 4 mg via INTRAVENOUS
  Filled 2020-01-29: qty 1

## 2020-01-29 MED ORDER — LORAZEPAM 2 MG/ML IJ SOLN: 0.0000 mg | INTRAMUSCULAR | Status: AC

## 2020-01-29 MED ORDER — PANTOPRAZOLE SODIUM 40 MG PO TBEC
40.0000 mg | DELAYED_RELEASE_TABLET | Freq: Every day | ORAL | Status: DC
  Administered 2020-01-30 – 2020-02-01 (×3): 40 mg via ORAL
  Filled 2020-01-29 (×3): qty 1

## 2020-01-29 MED ORDER — ACETAMINOPHEN 650 MG RE SUPP: 650.0000 mg | Freq: Four times a day (QID) | RECTAL | Status: DC | PRN

## 2020-01-29 MED ORDER — SODIUM CHLORIDE 0.9 % IV SOLN: INTRAVENOUS | Status: DC

## 2020-01-29 NOTE — Progress Notes (Signed)
Notified provider of need to order blood cultures.  

## 2020-01-29 NOTE — ED Notes (Signed)
Unable to draw lab work d/t blood products being administered prior to septic order set being ordered. Will draw upon completion of second unit of blood.

## 2020-01-29 NOTE — ED Notes (Signed)
Pt signed blood consent form at bedside.

## 2020-01-29 NOTE — H&P (Signed)
History and Physical    Allison [name] RJJ:[id number] DOB: 1971/08/07 DOA: 01/29/2020  PCP: Claiborne Rigg, NP  Patient coming from: home  I have personally briefly reviewed patient's old medical records in [facility]  Chief Complaint: alcohol withdrawal  HPI: Allison [name] is a 49 y.o. female with medical history significant of hypertension, alcohol abuse, iron deficiency anemia, who stopped drinking approximately 48 hours ago.  Patient had been a heavy drinker, but over the past week she decided she wanted to stop drinking.  She stopped approximately 48 hours ago and since then has had shaking, nausea, vomiting, diaphoresis, lightheadedness on standing.  She has been unable to keep anything down by mouth.  She has had some loose stools, but these have been brown in color.  Denies any melena or hematochezia.  She is not had any hematemesis.  She has not had any fever, cough, shortness of breath.  She had a fall approximately 2 days ago where she fell on her left side.  She does have some soreness in her left ribs/left flank.  She also reports that she recently started her menstrual cycle.  Her cycle has been irregular for several months and she has not any significant flow.  She reports noticing large clots over the last several days.  ED Course: On arrival to the emergency room she is noted to be hypertensive and tachycardic.  She received Librium and Ativan for alcohol withdrawal.  Basic labs were checked that showed significant lactic acidosis with a lactic acid of 4.5 and an anion gap of 22.  She was also noted to be significantly anemic with a hemoglobin of 5.8.  Baseline hemoglobin runs 8-9.  She reports that she has not been taking her iron supplements.  Other lab work showed a significant acute kidney injury with a creatinine of 2.38.  CT of the abdomen pelvis performed that did not show any internal bleeding, but did comment on possible sigmoid colitis.  She has been referred for  admission.  Review of Systems: As per HPI otherwise 10 point review of systems negative.    Past Medical History:  Diagnosis Date  . GERD (gastroesophageal reflux disease)   . Hypertension     History reviewed. No pertinent surgical history.  Social History:  reports that she has never smoked. She has never used smokeless tobacco. She reports current alcohol use of about 14.0 standard drinks of alcohol per week. She reports current drug use. Drug: Marijuana.  Allergies  Allergen Reactions  . Lisinopril Cough    Family History  Problem Relation Age of Onset  . Hypertension Mother   . Diabetes Mother   . Hypertension Father     Prior to Admission medications   Medication Sig Start Date End Date Taking? Authorizing Provider  carvedilol (COREG) 12.5 MG tablet Take 1 tablet (12.5 mg total) by mouth 2 (two) times daily with a meal. Patient not taking: Reported on 01/29/2020 06/13/19   Aldean Baker, NP  ferrous sulfate 325 (65 FE) MG tablet Take 1 tablet (325 mg total) by mouth daily with breakfast. Patient not taking: Reported on 01/29/2020 06/13/19   Aldean Baker, NP  folic acid (FOLVITE) 1 MG tablet Take 1 tablet (1 mg total) by mouth daily. Patient not taking: Reported on 01/29/2020 06/13/19   Aldean Baker, NP  losartan (COZAAR) 50 MG tablet Take 1 tablet (50 mg total) by mouth daily. Patient not taking: Reported on 01/29/2020 06/13/19   Gerilyn Pilgrim,  Marcelyn Ditty, NP  pantoprazole (PROTONIX) 40 MG tablet Take 1 tablet (40 mg total) by mouth daily. Patient not taking: Reported on 01/29/2020 06/14/19   Aldean Baker, NP  thiamine (VITAMIN B-1) 100 MG tablet Take 1 tablet (100 mg total) by mouth daily. Patient not taking: Reported on 01/29/2020 06/13/19   Aldean Baker, NP    Physical Exam: Vitals:   01/29/20 1245 01/29/20 1300 01/29/20 1315 01/29/20 1330  BP: 137/77 133/76 129/82 (!) 152/95  Pulse: 95 99 98 (!) 117  Resp: 17 19 13 17   Temp:      TempSrc:      SpO2: 99% 98% 99% 100%    Weight:      Height:        Constitutional: NAD, calm, comfortable Eyes: PERRL, lids and conjunctivae normal ENMT: Mucous membranes are moist. Posterior pharynx clear of any exudate or lesions.Normal dentition.  Neck: normal, supple, no masses, no thyromegaly Respiratory: clear to auscultation bilaterally, no wheezing, no crackles. Normal respiratory effort. No accessory muscle use.  Cardiovascular: Regular rate and rhythm, no murmurs / rubs / gallops. No extremity edema. 2+ pedal pulses. No carotid bruits.  Abdomen: no tenderness, no masses palpated. No hepatosplenomegaly. Bowel sounds positive.  Musculoskeletal: no clubbing / cyanosis. No joint deformity upper and lower extremities. Good ROM, no contractures. Normal muscle tone.  Skin: no rashes, lesions, ulcers. No induration Neurologic: CN 2-12 grossly intact. Sensation intact, DTR normal. Strength 5/5 in all 4.  Psychiatric: Normal judgment and insight. Alert and oriented x 3. Normal mood.    Labs on Admission: I have personally reviewed following labs and imaging studies  CBC: Recent Labs  Lab 01/29/20 0812  WBC 12.5*  NEUTROABS 10.7*  HGB 5.8*  HCT 18.4*  MCV 81.8  PLT 115*   Basic Metabolic Panel: Recent Labs  Lab 01/29/20 0812  NA 139  K 3.4*  CL 97*  CO2 20*  GLUCOSE 128*  BUN 15  CREATININE 2.38*  CALCIUM 8.3*   GFR: Estimated Creatinine Clearance: 25.6 mL/min (A) (by C-G formula based on SCr of 2.38 mg/dL (H)). Liver Function Tests: Recent Labs  Lab 01/29/20 0812  AST 172*  ALT 44  ALKPHOS 171*  BILITOT 2.2*  PROT 7.2  ALBUMIN 3.1*   Recent Labs  Lab 01/29/20 0812  LIPASE 47   No results for input(s): AMMONIA in the last 168 hours. Coagulation Profile: No results for input(s): INR, PROTIME in the last 168 hours. Cardiac Enzymes: No results for input(s): CKTOTAL, CKMB, CKMBINDEX, TROPONINI in the last 168 hours. BNP (last 3 results) No results for input(s): PROBNP in the last 8760  hours. HbA1C: No results for input(s): HGBA1C in the last 72 hours. CBG: No results for input(s): GLUCAP in the last 168 hours. Lipid Profile: No results for input(s): CHOL, HDL, LDLCALC, TRIG, CHOLHDL, LDLDIRECT in the last 72 hours. Thyroid Function Tests: No results for input(s): TSH, T4TOTAL, FREET4, T3FREE, THYROIDAB in the last 72 hours. Anemia Panel: Recent Labs    01/29/20 1005  VITAMINB12 529  FOLATE 4.8*  FERRITIN 23  TIBC 319  IRON 42  RETICCTPCT 3.4*   Urine analysis:    Component Value Date/Time   COLORURINE COLORLESS (A) 07/11/2019 1944   APPEARANCEUR CLEAR 07/11/2019 1944   LABSPEC 1.002 (L) 07/11/2019 1944   PHURINE 6.0 07/11/2019 1944   GLUCOSEU NEGATIVE 07/11/2019 1944   HGBUR NEGATIVE 07/11/2019 1944   BILIRUBINUR NEGATIVE 07/11/2019 1944   KETONESUR NEGATIVE 07/11/2019 1944  PROTEINUR NEGATIVE 07/11/2019 1944   UROBILINOGEN 0.2 03/18/2008 0140   NITRITE NEGATIVE 07/11/2019 1944   LEUKOCYTESUR NEGATIVE 07/11/2019 1944    Radiological Exams on Admission: CT ABDOMEN PELVIS WO CONTRAST  Result Date: 01/29/2020 CLINICAL DATA:  Left upper quadrant abdominal pain, alcohol withdrawals EXAM: CT ABDOMEN AND PELVIS WITHOUT CONTRAST TECHNIQUE: Multidetector CT imaging of the abdomen and pelvis was performed following the standard protocol without IV contrast. COMPARISON:  05/23/2016 FINDINGS: Lower chest: No acute pleural or parenchymal lung disease. Hepatobiliary: Diffuse fatty infiltration of the liver again seen. Calcified gallstones are identified without cholecystitis. No biliary dilatation. Pancreas: Pancreas is unremarkable. Spleen: Spleen is borderline enlarged measuring 12.3 cm in anterior-posterior dimension. This is stable. No focal splenic parenchymal abnormalities. Adrenals/Urinary Tract: Adrenals are unremarkable. No urinary tract calculi or obstructive uropathy. Bladder is unremarkable. Stomach/Bowel: There is no bowel obstruction or ileus. There is a  normal appendix in the right lower quadrant. There are prominent inflammatory changes surrounding the distal sigmoid colon within the lower pelvis, consistent with acute colitis or diverticulitis. No fluid collection, perforation, or abscess. Vascular/Lymphatic: No pathologic adenopathy within the abdomen or pelvis. Reproductive: Uterus and adnexal structures are age-appropriate. Other: No abdominal wall hernia or abnormality. No abdominopelvic ascites. Musculoskeletal: No acute or destructive bony lesions. Reconstructed images demonstrate no additional findings. IMPRESSION: 1. Inflammatory changes surrounding the distal sigmoid colon consistent with acute colitis or diverticulitis. No perforation, fluid collection, or abscess. 2. Diffuse fatty infiltration of the liver. 3. Cholelithiasis without cholecystitis. 4. Stable borderline splenomegaly. Electronically Signed   By: Sharlet Salina M.D.   On: 01/29/2020 13:17   DG Chest Port 1 View  Result Date: 01/29/2020 CLINICAL DATA:  Left chest wall pain secondary to a fall. EXAM: PORTABLE CHEST 1 VIEW COMPARISON:  Chest x-ray dated 03/01/2018 FINDINGS: The heart size and pulmonary vascularity are normal. Lungs are clear. There is a fracture of the posterolateral aspect of the left ninth rib. This was not visible on the prior study but this may not be acute. No lung contusion or pneumothorax. IMPRESSION: Fracture of the posterolateral aspect of the left ninth rib which may not be acute. Electronically Signed   By: Francene Boyers M.D.   On: 01/29/2020 08:45    EKG: Independently reviewed. Sinus tachycardia without ischemic changes  Assessment/Plan Active Problems:   Sepsis (HCC)   AKI (acute kidney injury) (HCC)   Acute blood loss anemia   Alcohol withdrawal (HCC)   Lactic acidosis   Fall at home, initial encounter   Thrombocytopenia (HCC)   Acute colitis     1. Possible sepsis.  Patient is noted to be tachycardic, with leukocytosis and elevated lactic  acid.  She also has elevated creatinine.  Etiologies include possible acute colitis.  She does not have any evidence of pneumonia.  Urinalysis currently pending.  She received 2 L of IV fluids in the emergency room.  Hemodynamics are currently stable.  Heart rate has improved.  Repeat lactic acid is pending.  Blood culture has been sent.  Continue antibiotics with Rocephin and Flagyl. 2. Alcohol withdrawal.  We will continue on Ativan protocol for alcohol withdrawal.  TOC consult to help patient receive community resources. 3. Lactic acidosis.  Likely related to dehydration, with concern for underlying sepsis.  Continue IV fluids and recheck labs.  Hemodynamically she is stable and she does not appear toxic. 4. Acute blood loss anemia.  Possibly related to heavy menstrual cycle.  She does not have any evidence of GI  bleeding and stool for occult blood was found to be negative.  She is receiving 2 units of PRBC.  Follow hemoglobin.  She reports that she has not been taking her iron supplementation at home. 5. Acute kidney injury.  Related to dehydration.  Continue IV fluids and recheck in a.m.  She does not have any signs of obstructive uropathy on CT imaging. 6. Left ninth rib fracture status post fall.  Continue supportive management with pain management.  Will do physical therapy input to help address falls. 7. Thrombocytopenia.  Suspect this related to alcohol use.  Continue to monitor. 8. Elevated liver enzymes.  Typical picture of alcoholic hepatitis.  Continue to follow and trend enzymes.  DVT prophylaxis: SCDs Code Status: full code  Family Communication: offered to call patient's family, but she said she would update them  Disposition Plan: discharge home once alcohol withdrawal has stabilized, anemia has stabilized and acidosis is resolved  Consults called:   Admission status: inpatient, progressive care.  Due to patient's multiple organ system abnormalities, including AKI, severe anemia, and  alcohol withdrawal, I feel that she will need to be in the hospital beyond 2 midnights and should be inpatient   Kathie Dike MD [facility]   If 7PM-7AM, please contact night-coverage [email or web address]   01/29/2020, 2:25 PM

## 2020-01-29 NOTE — ED Provider Notes (Signed)
[facility] EMERGENCY DEPARTMENT Provider Note   CSN: [id number] Arrival date & time: 01/29/20  6433     History Chief Complaint  Patient presents with  . Withdraw    Allison [name] is a 49 y.o. female.  49 yo F with a chief complaints of withdrawal symptoms after stopping drinking alcohol.  Patient has been a heavy drinker, states she does it to cope with problems in her home life.  Most often the past couple years has been due to her husband and his interaction with her children.  She had gotten to the point that over the past week or she decided she wanted to stop drinking.  Has stopped for about 48 hours.  Having some shaking and had profuse vomiting yesterday.  She had fallen a few days ago and was seen in outside emergency department.  Was discharged home.  States that she would like help quitting drinking.  Has tried to do this in the past.  Went through [place], but had a scenario happen in her home life that caused her to start drinking again.  The history is provided by the patient.  Illness Severity:  Moderate Onset quality:  Gradual Duration:  2 days Timing:  Constant Progression:  Worsening Chronicity:  New Associated symptoms: chest pain, nausea and vomiting   Associated symptoms: no abdominal pain, no congestion, no diarrhea, no fever, no headaches, no myalgias, no rhinorrhea, no shortness of breath and no wheezing        Past Medical History:  Diagnosis Date  . GERD (gastroesophageal reflux disease)   . Hypertension     Patient Active Problem List   Diagnosis Date Noted  . AKI (acute kidney injury) (HCC) 01/29/2020  . Acute blood loss anemia 01/29/2020  . Alcohol withdrawal (HCC) 01/29/2020  . Lactic acidosis 01/29/2020  . Fall at home, initial encounter 01/29/2020  . Thrombocytopenia (HCC) 01/29/2020  . Acute colitis 01/29/2020  . Fracture of one rib, left side, initial encounter for closed fracture 01/29/2020  . Bipolar disorder,  unspecified (HCC) 06/10/2019  . Anemia 08/19/2017  . Acute pyelonephritis 05/22/2016  . Pyelonephritis 05/22/2016  . Hyperglycemia 05/22/2016  . Sepsis (HCC) 05/22/2016  . Hypertension 05/22/2016    History reviewed. No pertinent surgical history.   OB History   No obstetric history on file.     Family History  Problem Relation Age of Onset  . Hypertension Mother   . Diabetes Mother   . Hypertension Father     Social History   Tobacco Use  . Smoking status: Never Smoker  . Smokeless tobacco: Never Used  Substance Use Topics  . Alcohol use: Yes    Alcohol/week: 14.0 standard drinks    Types: 14 Standard drinks or equivalent per week    Comment: pint of liquor  . Drug use: Yes    Types: Marijuana    Home Medications Prior to Admission medications   Medication Sig Start Date End Date Taking? Authorizing Provider  carvedilol (COREG) 12.5 MG tablet Take 1 tablet (12.5 mg total) by mouth 2 (two) times daily with a meal. Patient not taking: Reported on 01/29/2020 06/13/19   Aldean Baker, NP  ferrous sulfate 325 (65 FE) MG tablet Take 1 tablet (325 mg total) by mouth daily with breakfast. Patient not taking: Reported on 01/29/2020 06/13/19   Aldean Baker, NP  folic acid (FOLVITE) 1 MG tablet Take 1 tablet (1 mg total) by mouth daily. Patient not taking: Reported on  01/29/2020 06/13/19   Aldean Baker, NP  losartan (COZAAR) 50 MG tablet Take 1 tablet (50 mg total) by mouth daily. Patient not taking: Reported on 01/29/2020 06/13/19   Aldean Baker, NP  pantoprazole (PROTONIX) 40 MG tablet Take 1 tablet (40 mg total) by mouth daily. Patient not taking: Reported on 01/29/2020 06/14/19   Aldean Baker, NP  thiamine (VITAMIN B-1) 100 MG tablet Take 1 tablet (100 mg total) by mouth daily. Patient not taking: Reported on 01/29/2020 06/13/19   Aldean Baker, NP    Allergies    Lisinopril  Review of Systems   Review of Systems  Constitutional: Negative for chills and fever.  HENT:  Negative for congestion and rhinorrhea.   Eyes: Negative for redness and visual disturbance.  Respiratory: Negative for shortness of breath and wheezing.   Cardiovascular: Positive for chest pain. Negative for palpitations.  Gastrointestinal: Positive for nausea and vomiting. Negative for abdominal pain and diarrhea.  Genitourinary: Negative for dysuria and urgency.  Musculoskeletal: Negative for arthralgias and myalgias.  Skin: Negative for pallor and wound.  Neurological: Negative for dizziness and headaches.    Physical Exam Updated Vital Signs BP (!) 152/95   Pulse (!) 117   Temp 99.1 F (37.3 C) (Oral)   Resp 17   Ht 5\' 2"  (1.575 m)   Wt 63.5 kg   SpO2 100% Comment: room air  BMI 25.61 kg/m   Physical Exam Vitals and nursing note reviewed.  Constitutional:      General: She is not in acute distress.    Appearance: She is well-developed. She is not diaphoretic.  HENT:     Head: Normocephalic and atraumatic.  Eyes:     Pupils: Pupils are equal, round, and reactive to light.  Cardiovascular:     Rate and Rhythm: Regular rhythm. Tachycardia present.     Heart sounds: No murmur. No friction rub. No gallop.   Pulmonary:     Effort: Pulmonary effort is normal.     Breath sounds: No wheezing or rales.  Abdominal:     General: There is no distension.     Palpations: Abdomen is soft.     Tenderness: There is abdominal tenderness (worst to LUQ).  Genitourinary:    Comments: Soft brown stool in the vault Musculoskeletal:        General: Tenderness present.     Cervical back: Normal range of motion and neck supple.     Comments: Tender to palpation about the left chest wall.  Skin:    General: Skin is warm and dry.  Neurological:     Mental Status: She is alert and oriented to person, place, and time.  Psychiatric:        Behavior: Behavior normal.     ED Results / Procedures / Treatments   Labs (all labs ordered are listed, but only abnormal results are  displayed) Labs Reviewed  CBC WITH DIFFERENTIAL/PLATELET - Abnormal; Notable for the following components:      Result Value   WBC 12.5 (*)    RBC 2.25 (*)    Hemoglobin 5.8 (*)    HCT 18.4 (*)    MCH 25.8 (*)    RDW 25.1 (*)    Platelets 115 (*)    Neutro Abs 10.7 (*)    Abs Immature Granulocytes 0.13 (*)    All other components within normal limits  COMPREHENSIVE METABOLIC PANEL - Abnormal; Notable for the following components:   Potassium 3.4 (*)  Chloride 97 (*)    CO2 20 (*)    Glucose, Bld 128 (*)    Creatinine, Ser 2.38 (*)    Calcium 8.3 (*)    Albumin 3.1 (*)    AST 172 (*)    Alkaline Phosphatase 171 (*)    Total Bilirubin 2.2 (*)    GFR calc non Af Amer 23 (*)    GFR calc Af Amer 27 (*)    Anion gap 22 (*)    All other components within normal limits  FOLATE - Abnormal; Notable for the following components:   Folate 4.8 (*)    All other components within normal limits  RETICULOCYTES - Abnormal; Notable for the following components:   Retic Ct Pct 3.4 (*)    RBC. 1.59 (*)    Immature Retic Fract 41.1 (*)    All other components within normal limits  LACTIC ACID, PLASMA - Abnormal; Notable for the following components:   Lactic Acid, Venous 4.5 (*)    All other components within normal limits  SARS CORONAVIRUS 2 (TAT 6-24 HRS)  CULTURE, BLOOD (ROUTINE X 2)  CULTURE, BLOOD (ROUTINE X 2)  LIPASE, BLOOD  VITAMIN B12  IRON AND TIBC  FERRITIN  ETHANOL  URINALYSIS, ROUTINE W REFLEX MICROSCOPIC  LACTIC ACID, PLASMA  BASIC METABOLIC PANEL  POC OCCULT BLOOD, ED  TYPE AND SCREEN  PREPARE RBC (CROSSMATCH)  ABO/RH    EKG EKG Interpretation  Date/Time:  Wednesday January 29 2020 08:16:59 EST Ventricular Rate:  108 PR Interval:    QRS Duration: 80 QT Interval:  350 QTC Calculation: 470 R Axis:   44 Text Interpretation: Sinus tachycardia Borderline repolarization abnormality faster Otherwise no significant change Confirmed by Melene Plan [phone]) on  01/29/2020 8:42:27 AM   Radiology CT ABDOMEN PELVIS WO CONTRAST  Result Date: 01/29/2020 CLINICAL DATA:  Left upper quadrant abdominal pain, alcohol withdrawals EXAM: CT ABDOMEN AND PELVIS WITHOUT CONTRAST TECHNIQUE: Multidetector CT imaging of the abdomen and pelvis was performed following the standard protocol without IV contrast. COMPARISON:  05/23/2016 FINDINGS: Lower chest: No acute pleural or parenchymal lung disease. Hepatobiliary: Diffuse fatty infiltration of the liver again seen. Calcified gallstones are identified without cholecystitis. No biliary dilatation. Pancreas: Pancreas is unremarkable. Spleen: Spleen is borderline enlarged measuring 12.3 cm in anterior-posterior dimension. This is stable. No focal splenic parenchymal abnormalities. Adrenals/Urinary Tract: Adrenals are unremarkable. No urinary tract calculi or obstructive uropathy. Bladder is unremarkable. Stomach/Bowel: There is no bowel obstruction or ileus. There is a normal appendix in the right lower quadrant. There are prominent inflammatory changes surrounding the distal sigmoid colon within the lower pelvis, consistent with acute colitis or diverticulitis. No fluid collection, perforation, or abscess. Vascular/Lymphatic: No pathologic adenopathy within the abdomen or pelvis. Reproductive: Uterus and adnexal structures are age-appropriate. Other: No abdominal wall hernia or abnormality. No abdominopelvic ascites. Musculoskeletal: No acute or destructive bony lesions. Reconstructed images demonstrate no additional findings. IMPRESSION: 1. Inflammatory changes surrounding the distal sigmoid colon consistent with acute colitis or diverticulitis. No perforation, fluid collection, or abscess. 2. Diffuse fatty infiltration of the liver. 3. Cholelithiasis without cholecystitis. 4. Stable borderline splenomegaly. Electronically Signed   By: Sharlet Salina M.D.   On: 01/29/2020 13:17   DG Chest Port 1 View  Result Date: 01/29/2020 CLINICAL  DATA:  Left chest wall pain secondary to a fall. EXAM: PORTABLE CHEST 1 VIEW COMPARISON:  Chest x-ray dated 03/01/2018 FINDINGS: The heart size and pulmonary vascularity are normal. Lungs are clear. There is a  fracture of the posterolateral aspect of the left ninth rib. This was not visible on the prior study but this may not be acute. No lung contusion or pneumothorax. IMPRESSION: Fracture of the posterolateral aspect of the left ninth rib which may not be acute. Electronically Signed   By: Francene BoyersJames  Maxwell M.D.   On: 01/29/2020 08:45    Procedures Procedures (including critical care time)  Medications Ordered in ED Medications  metroNIDAZOLE (FLAGYL) IVPB 500 mg (has no administration in time range)  sodium chloride 0.9 % bolus 1,000 mL (0 mLs Intravenous Stopped 01/29/20 0924)  LORazepam (ATIVAN) injection 1 mg (1 mg Intravenous Given 01/29/20 0835)  chlordiazePOXIDE (LIBRIUM) capsule 100 mg (100 mg Oral Given 01/29/20 0831)  0.9 %  sodium chloride infusion (Manually program via Guardrails IV Fluids) ( Intravenous New Bag/Given 01/29/20 1151)  thiamine tablet 100 mg (100 mg Oral Given 01/29/20 1014)  morphine 4 MG/ML injection 4 mg (4 mg Intravenous Given 01/29/20 1014)  ondansetron (ZOFRAN) injection 4 mg (4 mg Intravenous Given 01/29/20 1014)  sodium chloride 0.9 % bolus 1,000 mL (0 mLs Intravenous Stopped 01/29/20 1151)  cefTRIAXone (ROCEPHIN) 2 g in sodium chloride 0.9 % 100 mL IVPB (0 g Intravenous Stopped 01/29/20 1534)    ED Course  I have reviewed the triage vital signs and the nursing notes.  Pertinent labs & imaging results that were available during my care of the patient were reviewed by me and considered in my medical decision making (see chart for details).    MDM Rules/Calculators/A&P                      49 yo F with a cc of alcohol withdrawal symptoms. Going on for 48 hours.  No seizures no ams.  Significantly tachycardic, hypertensive.  Will give Ativan and Librium here.  Bolus of IV  fluids with 48 hours of vomiting.  Lab work.  Reassess.  Patient's hemoglobin found to be 5.8.  Will transfuse 2 units of blood.  Some concern for intra-abdominal pathology as the patient had a fall recently and now has a couple grams drop in hemoglobin.  Will obtain a CT scan.  Unfortunately the patient's renal function is also worsened significantly and so we will perform this without contrast.  She does have an anion gap at 22.  I suspect this is likely a ketoacidosis as the patient's had decreased oral intake but will add on a lactate and a alcohol level.  Alcohol level is negative.  Lactate is greater than 4.  Seems less likely to be sepsis no reported fevers no obvious source of infection.  Still have not obtained a UA.  Will give Rocephin.  The patient is noted to have a lactate>4. With the current information available to me, I don't think the patient is in septic shock. The lactate>4, is related to hypovolemia and liver dysfunction due to alcoholism.   CT scan performed read as possible colitis.  Will add Flagyl. Code sepsis at this point.    Discussed with the hospitalist for admission.  CRITICAL CARE Performed by: Rae Roamaniel Patrick Treniyah Lynn   Total critical care time: 80 minutes  Critical care time was exclusive of separately billable procedures and treating other patients.  Critical care was necessary to treat or prevent imminent or life-threatening deterioration.  Critical care was time spent personally by me on the following activities: development of treatment plan with patient and/or surrogate as well as nursing, discussions with consultants, evaluation of  patient's response to treatment, examination of patient, obtaining history from patient or surrogate, ordering and performing treatments and interventions, ordering and review of laboratory studies, ordering and review of radiographic studies, pulse oximetry and re-evaluation of patient's condition.  The patients results and plan  were reviewed and discussed.   Any x-rays performed were independently reviewed by myself.   Differential diagnosis were considered with the presenting HPI.  Medications  metroNIDAZOLE (FLAGYL) IVPB 500 mg (has no administration in time range)  sodium chloride 0.9 % bolus 1,000 mL (0 mLs Intravenous Stopped 01/29/20 0924)  LORazepam (ATIVAN) injection 1 mg (1 mg Intravenous Given 01/29/20 0835)  chlordiazePOXIDE (LIBRIUM) capsule 100 mg (100 mg Oral Given 01/29/20 0831)  0.9 %  sodium chloride infusion (Manually program via Guardrails IV Fluids) ( Intravenous New Bag/Given 01/29/20 1151)  thiamine tablet 100 mg (100 mg Oral Given 01/29/20 1014)  morphine 4 MG/ML injection 4 mg (4 mg Intravenous Given 01/29/20 1014)  ondansetron (ZOFRAN) injection 4 mg (4 mg Intravenous Given 01/29/20 1014)  sodium chloride 0.9 % bolus 1,000 mL (0 mLs Intravenous Stopped 01/29/20 1151)  cefTRIAXone (ROCEPHIN) 2 g in sodium chloride 0.9 % 100 mL IVPB (0 g Intravenous Stopped 01/29/20 1534)    Vitals:   01/29/20 1245 01/29/20 1300 01/29/20 1315 01/29/20 1330  BP: 137/77 133/76 129/82 (!) 152/95  Pulse: 95 99 98 (!) 117  Resp: 17 19 13 17   Temp:      TempSrc:      SpO2: 99% 98% 99% 100%  Weight:      Height:        Final diagnoses:  Colitis  Symptomatic anemia  Alcohol abuse    Admission/ observation were discussed with the admitting physician, patient and/or family and they are comfortable with the plan.    Final Clinical Impression(s) / ED Diagnoses Final diagnoses:  Colitis  Symptomatic anemia  Alcohol abuse    Rx / DC Orders ED Discharge Orders    None       Deno Etienne, DO 01/29/20 1541

## 2020-01-29 NOTE — ED Notes (Signed)
Patient given 2 cups of ice water.

## 2020-01-29 NOTE — ED Triage Notes (Addendum)
Pt arrives to ED from home with complaints of alcohol withdraw since yesterday. Patient states that she usually drinks multiple shots, beers, and pints of liquor a day. Patient states she is ready to stop drinking.

## 2020-01-30 DIAGNOSIS — E872 Acidosis: Secondary | ICD-10-CM

## 2020-01-30 DIAGNOSIS — W19XXXA Unspecified fall, initial encounter: Secondary | ICD-10-CM

## 2020-01-30 DIAGNOSIS — F10230 Alcohol dependence with withdrawal, uncomplicated: Secondary | ICD-10-CM

## 2020-01-30 DIAGNOSIS — D696 Thrombocytopenia, unspecified: Secondary | ICD-10-CM

## 2020-01-30 DIAGNOSIS — N179 Acute kidney failure, unspecified: Secondary | ICD-10-CM

## 2020-01-30 DIAGNOSIS — S2232XA Fracture of one rib, left side, initial encounter for closed fracture: Secondary | ICD-10-CM

## 2020-01-30 DIAGNOSIS — E876 Hypokalemia: Secondary | ICD-10-CM

## 2020-01-30 DIAGNOSIS — Y92009 Unspecified place in unspecified non-institutional (private) residence as the place of occurrence of the external cause: Secondary | ICD-10-CM

## 2020-01-30 LAB — COMPREHENSIVE METABOLIC PANEL
ALT: 31 U/L (ref 0–44)
AST: 135 U/L — ABNORMAL HIGH (ref 15–41)
Albumin: 2.6 g/dL — ABNORMAL LOW (ref 3.5–5.0)
Alkaline Phosphatase: 124 U/L (ref 38–126)
Anion gap: 11 (ref 5–15)
BUN: 24 mg/dL — ABNORMAL HIGH (ref 6–20)
CO2: 23 mmol/L (ref 22–32)
Calcium: 6.9 mg/dL — ABNORMAL LOW (ref 8.9–10.3)
Chloride: 103 mmol/L (ref 98–111)
Creatinine, Ser: 2.33 mg/dL — ABNORMAL HIGH (ref 0.44–1.00)
GFR calc Af Amer: 28 mL/min — ABNORMAL LOW (ref >60)
GFR calc non Af Amer: 24 mL/min — ABNORMAL LOW (ref >60)
Glucose, Bld: 95 mg/dL (ref 70–99)
Potassium: 3.1 mmol/L — ABNORMAL LOW (ref 3.5–5.1)
Sodium: 137 mmol/L (ref 135–145)
Total Bilirubin: 1.5 mg/dL — ABNORMAL HIGH (ref 0.3–1.2)
Total Protein: 5.5 g/dL — ABNORMAL LOW (ref 6.5–8.1)

## 2020-01-30 LAB — PHOSPHORUS: Phosphorus: 1.8 mg/dL — ABNORMAL LOW (ref 2.5–4.6)

## 2020-01-30 LAB — OCCULT BLOOD X 1 CARD TO LAB, STOOL: Fecal Occult Bld: NEGATIVE

## 2020-01-30 LAB — HEMOGLOBIN AND HEMATOCRIT, BLOOD
HCT: 23.7 % — ABNORMAL LOW (ref 36.0–46.0)
Hemoglobin: 8 g/dL — ABNORMAL LOW (ref 12.0–15.0)

## 2020-01-30 LAB — HIV ANTIBODY (ROUTINE TESTING W REFLEX): HIV Screen 4th Generation wRfx: NONREACTIVE

## 2020-01-30 LAB — MAGNESIUM: Magnesium: 1 mg/dL — ABNORMAL LOW (ref 1.7–2.4)

## 2020-01-30 LAB — C DIFFICILE QUICK SCREEN W PCR REFLEX
C Diff antigen: NEGATIVE
C Diff interpretation: NOT DETECTED
C Diff toxin: NEGATIVE

## 2020-01-30 LAB — LACTIC ACID, PLASMA: Lactic Acid, Venous: 1.1 mmol/L (ref 0.5–1.9)

## 2020-01-30 MED ORDER — MAGNESIUM SULFATE IN D5W 1-5 GM/100ML-% IV SOLN
1.0000 g | Freq: Once | INTRAVENOUS | Status: AC
  Administered 2020-01-30: 1 g via INTRAVENOUS
  Filled 2020-01-30: qty 100

## 2020-01-30 MED ORDER — LIDOCAINE 5 % EX PTCH
1.0000 | MEDICATED_PATCH | CUTANEOUS | Status: DC
  Administered 2020-01-30 – 2020-02-01 (×3): 1 via TRANSDERMAL
  Filled 2020-01-30 (×3): qty 1

## 2020-01-30 MED ORDER — HYDROCODONE-ACETAMINOPHEN 5-325 MG PO TABS
1.0000 | ORAL_TABLET | ORAL | Status: DC | PRN
  Administered 2020-01-30 – 2020-02-01 (×6): 1 via ORAL
  Filled 2020-01-30 (×6): qty 1

## 2020-01-30 MED ORDER — POTASSIUM CHLORIDE CRYS ER 20 MEQ PO TBCR
40.0000 meq | EXTENDED_RELEASE_TABLET | ORAL | Status: AC
  Administered 2020-01-30 (×2): 40 meq via ORAL
  Filled 2020-01-30 (×2): qty 2

## 2020-01-30 NOTE — Progress Notes (Signed)
Patient complaining of 8/10 pain on left side that is unrelieved by Tylenol. Paged Roda Shutters, MD. See new orders for pain medications.

## 2020-01-30 NOTE — Progress Notes (Signed)
PROGRESS NOTE  Allison [name] TGG:[id number] DOB: Feb 05, 1971 DOA: 01/29/2020 PCP: Claiborne Rigg, NP  HPI/Recap of past 24 hours:  C/o left rib pain Denies ab pain, appear weak, but aaox3, no obvious tremor, denies halluciantion  Assessment/Plan: Active Problems:   Sepsis (HCC)   AKI (acute kidney injury) (HCC)   Acute blood loss anemia   Alcohol withdrawal (HCC)   Lactic acidosis   Fall at home, initial encounter   Thrombocytopenia (HCC)   Acute colitis   Fracture of one rib, left side, initial encounter for closed fracture  Sepsis? Patient is noted on presentation to be tachycardic, with leukocytosis and elevated lactic acid (4.5).  She also has elevated creatinine.   Etiologies include possible acute colitis. CT ab" Inflammatory changes surrounding the distal sigmoid colon consistent with acute colitis or diverticulitis. No perforation, fluid collection, or abscess." She does not have any evidence of pneumonia.  Urinalysis + for bacteria, will sent urine culture. Blood culture in process  She received 2 L of IV fluids in the emergency room.  Hemodynamics are currently stable.  Heart rate has improved.  Repeat lactic acid has normalized   Continue antibiotics with Rocephin and Flagyl. F/u on blood and urine culture  AKI Baseline cr wnl, cr on presentation was 2.38 Get urine culture,  Ct ab on presentation on obstructive nephropathy, due to persistently elevated cr will get renal US Continue hydration, Renal dosing meds, hold home meds cozaar   Anemia; s/p 2prbc Likely from heavy menses and chronic alcohol use fobt pending Iron panel has improved from three yrs ago, b12 unremarkable, folate decreased at 4.8 Will add on retic count, suspect will be inappropriately low She is advised to follow up with pcp and gyn  Thrombocytopenia From alcohol? Does has stable borderline splenomegaly on ct monitor  Hypokalemia/hypomagnesemia Replace mag/k, repeat lab in am Keep  on tele  lft elevation Likely from fatty liver and alcohol   CT ab " Diffuse fatty infiltration of the liver.Calcified gallstones are identified without cholecystitis. No biliary dilatation."  HTN: Continue coreg, hold cozaar due to aki  Fall at home with 9th rib fracture with left sided rib pain, reports she was trying to quit drinking, got up and fell, denies dizziness, denies chest pain Topical lidocaine patch and prn norco  Alcohol abuse/alcohol withdrawal:  cessation education provided, she reports trying to quit, she denies h/o alcohol withdrawal seizure. Social worker consulted  On CIWA protocol  DVT Prophylaxis:scd's in the setting on severe anemia  Code Status: full  Family Communication: patient   Disposition Plan: possible home in 24-48hrs pending hgb, cr and clinical improvement   Consultants:  Child psychotherapist  Procedures:  none  Antibiotics:  Rocephin/flagyl   Objective: BP (!) 142/98 (BP Location: Left Arm)   Pulse 79   Temp 99.1 F (37.3 C) (Oral)   Resp (!) 23   Ht 5\' 2"  (1.575 m)   Wt 63.5 kg   SpO2 98%   BMI 25.61 kg/m   Intake/Output Summary (Last 24 hours) at 01/30/2020 0736 Last data filed at 01/29/2020 2108 Gross per 24 hour  Intake 1381.67 ml  Output --  Net 1381.67 ml   Filed Weights   01/29/20 0840  Weight: 63.5 kg    Exam: Patient is examined daily including today on 01/30/2020, exams remain the same as of yesterday except that has changed    General:  Drowsy, weak, but oriented x3  Cardiovascular: RRR  Respiratory: CTABL  Abdomen: Soft/ND/NT,  positive BS  Musculoskeletal: No Edema  Neuro: drowsy , but oriented   Data Reviewed: Basic Metabolic Panel: Recent Labs  Lab 01/29/20 0812 01/30/20 0047  NA 139 137  K 3.4* 3.1*  CL 97* 103  CO2 20* 23  GLUCOSE 128* 95  BUN 15 24*  CREATININE 2.38* 2.33*  CALCIUM 8.3* 6.9*  MG  --  1.0*  PHOS  --  1.8*   Liver Function Tests: Recent Labs  Lab 01/29/20 0812  01/30/20 0047  AST 172* 135*  ALT 44 31  ALKPHOS 171* 124  BILITOT 2.2* 1.5*  PROT 7.2 5.5*  ALBUMIN 3.1* 2.6*   Recent Labs  Lab 01/29/20 0812  LIPASE 47   No results for input(s): AMMONIA in the last 168 hours. CBC: Recent Labs  Lab 01/29/20 0812 01/30/20 0047  WBC 12.5*  --   NEUTROABS 10.7*  --   HGB 5.8* 8.0*  HCT 18.4* 23.7*  MCV 81.8  --   PLT 115*  --    Cardiac Enzymes:   No results for input(s): CKTOTAL, CKMB, CKMBINDEX, TROPONINI in the last 168 hours. BNP (last 3 results) No results for input(s): BNP in the last 8760 hours.  ProBNP (last 3 results) No results for input(s): PROBNP in the last 8760 hours.  CBG: No results for input(s): GLUCAP in the last 168 hours.  Recent Results (from the past 240 hour(s))  SARS CORONAVIRUS 2 (TAT 6-24 HRS) Nasopharyngeal Nasopharyngeal Swab     Status: None   Collection Time: 01/29/20  3:32 PM   Specimen: Nasopharyngeal Swab  Result Value Ref Range Status   SARS Coronavirus 2 NEGATIVE NEGATIVE Final    Comment: (NOTE) SARS-CoV-2 target nucleic acids are NOT DETECTED. The SARS-CoV-2 RNA is generally detectable in upper and lower respiratory specimens during the acute phase of infection. Negative results do not preclude SARS-CoV-2 infection, do not rule out co-infections with other pathogens, and should not be used as the sole basis for treatment or other patient management decisions. Negative results must be combined with clinical observations, patient history, and epidemiological information. The expected result is Negative. Fact Sheet for Patients: SugarRoll.be Fact Sheet for Healthcare Providers: https://www.woods-mathews.com/ This test is not yet approved or cleared by the [place] FDA and  has been authorized for detection and/or diagnosis of SARS-CoV-2 by FDA under an Emergency Use Authorization (EUA). This EUA will remain  in effect (meaning this test can be  used) for the duration of the COVID-19 declaration under Section 56 4(b)(1) of the Act, 21 U.S.C. section 360bbb-3(b)(1), unless the authorization is terminated or revoked sooner. Performed at [facility], [place] [address] [place], [place] [address]   MRSA PCR Screening     Status: None   Collection Time: 01/29/20  7:25 PM   Specimen: Nasal Mucosa; Nasopharyngeal  Result Value Ref Range Status   MRSA by PCR NEGATIVE NEGATIVE Final    Comment:        The GeneXpert MRSA Assay (FDA approved for NASAL specimens only), is one component of a comprehensive MRSA colonization surveillance program. It is not intended to diagnose MRSA infection nor to guide or monitor treatment for MRSA infections. Performed at [facility], [place] [address], [place], [place] [address]      Studies: CT ABDOMEN PELVIS WO CONTRAST  Result Date: 01/29/2020 CLINICAL DATA:  Left upper quadrant abdominal pain, alcohol withdrawals EXAM: CT ABDOMEN AND PELVIS WITHOUT CONTRAST TECHNIQUE: Multidetector CT imaging of the abdomen and pelvis  was performed following the standard protocol without IV contrast. COMPARISON:  05/23/2016 FINDINGS: Lower chest: No acute pleural or parenchymal lung disease. Hepatobiliary: Diffuse fatty infiltration of the liver again seen. Calcified gallstones are identified without cholecystitis. No biliary dilatation. Pancreas: Pancreas is unremarkable. Spleen: Spleen is borderline enlarged measuring 12.3 cm in anterior-posterior dimension. This is stable. No focal splenic parenchymal abnormalities. Adrenals/Urinary Tract: Adrenals are unremarkable. No urinary tract calculi or obstructive uropathy. Bladder is unremarkable. Stomach/Bowel: There is no bowel obstruction or ileus. There is a normal appendix in the right lower quadrant. There are prominent inflammatory changes surrounding the distal sigmoid colon within the lower pelvis, consistent with acute colitis or diverticulitis. No fluid  collection, perforation, or abscess. Vascular/Lymphatic: No pathologic adenopathy within the abdomen or pelvis. Reproductive: Uterus and adnexal structures are age-appropriate. Other: No abdominal wall hernia or abnormality. No abdominopelvic ascites. Musculoskeletal: No acute or destructive bony lesions. Reconstructed images demonstrate no additional findings. IMPRESSION: 1. Inflammatory changes surrounding the distal sigmoid colon consistent with acute colitis or diverticulitis. No perforation, fluid collection, or abscess. 2. Diffuse fatty infiltration of the liver. 3. Cholelithiasis without cholecystitis. 4. Stable borderline splenomegaly. Electronically Signed   By: Sharlet Salina M.D.   On: 01/29/2020 13:17   DG Chest Port 1 View  Result Date: 01/29/2020 CLINICAL DATA:  Left chest wall pain secondary to a fall. EXAM: PORTABLE CHEST 1 VIEW COMPARISON:  Chest x-ray dated 03/01/2018 FINDINGS: The heart size and pulmonary vascularity are normal. Lungs are clear. There is a fracture of the posterolateral aspect of the left ninth rib. This was not visible on the prior study but this may not be acute. No lung contusion or pneumothorax. IMPRESSION: Fracture of the posterolateral aspect of the left ninth rib which may not be acute. Electronically Signed   By: Francene Boyers M.D.   On: 01/29/2020 08:45    Scheduled Meds: . carvedilol  12.5 mg Oral BID WC  . folic acid  1 mg Oral Daily  . LORazepam  0-4 mg Intravenous Q4H   Followed by  . [START ON 01/31/2020] LORazepam  0-4 mg Intravenous Q8H  . multivitamin with minerals  1 tablet Oral Daily  . pantoprazole  40 mg Oral Daily  . potassium chloride  40 mEq Oral Q4H  . thiamine  100 mg Oral Daily   Or  . thiamine  100 mg Intravenous Daily    Continuous Infusions: . sodium chloride 125 mL/hr at 01/30/20 0232  . cefTRIAXone (ROCEPHIN)  IV    . magnesium sulfate bolus IVPB    . metronidazole 500 mg (01/30/20 0045)     Time spent: I have  personally reviewed and interpreted on  01/30/2020 daily labs, tele strips, imagings as discussed above under date review session and assessment and plans.  I reviewed all nursing notes, pharmacy notes,  vitals, pertinent old records  I have discussed plan of care as described above with RN , patient  on 01/30/2020   Albertine Grates MD, PhD, FACP  [facility]  Available via Epic secure chat 7am-7pm for nonurgent issues Please page for urgent issues, pager number available through [email or web address] .   01/30/2020, 7:36 AM  LOS: 1 day

## 2020-01-30 NOTE — Evaluation (Signed)
Physical Therapy Evaluation Patient Details Name: Allison Allison [name] MRN: [id number] DOB: 05/06/1971  Today's Date: 01/30/2020   History of Present Illness  Pt is a 49 y/o female admitted secondary to sepsis and alcohol withdrawal. Pt also with fall that resulted in L 9th rib fx. Imaging found acute colitis. PMH includes HTN, alcohol abuse, and iron deficiency anemia.   Clinical Impression  Pt admitted secondary to problem above with problem above with deficits below. Pt with increased pain, however, tolerated mobility fairly well. Required min guard A for mobility tasks. Pt guarded during mobility. Reports husband and kids can assist at d/c. Anticipate she will progress well and not require follow up PT. Will continue to follow acutely to maximize functional mobility independence and safety.    Follow Up Recommendations No PT follow up    Equipment Recommendations  3in1 (PT)    Recommendations for Other Services       Precautions / Restrictions Precautions Precautions: Fall Restrictions Weight Bearing Restrictions: No      Mobility  Bed Mobility Overal bed mobility: Needs Assistance Bed Mobility: Sidelying to Sit;Sit to Sidelying   Sidelying to sit: Min guard     Sit to sidelying: Min guard General bed mobility comments: Min guard for safety. Increased time required secondary to pain.   Transfers Overall transfer level: Needs assistance Equipment used: None Transfers: Sit to/from Stand Sit to Stand: Min guard         General transfer comment: slow to rise secondary to pain. Min guard for safety.   Ambulation/Gait Ambulation/Gait assistance: Min guard Gait Distance (Feet): 150 Feet Assistive device: IV Pole Gait Pattern/deviations: Step-through pattern;Decreased stride length Gait velocity: Decreased   General Gait Details: Slow, cautious gait. Holding to IV pole for support. Min guard for safety.   Stairs            Wheelchair Mobility    Modified  Rankin (Stroke Patients Only)       Balance Overall balance assessment: Needs assistance Sitting-balance support: No upper extremity supported;Feet supported Sitting balance-Leahy Scale: Good     Standing balance support: No upper extremity supported;During functional activity;Single extremity supported Standing balance-Leahy Scale: Allison [name] Standing balance comment: Able to maintain static standing without UE support                              Pertinent Vitals/Pain Pain Assessment: 0-10 Pain Score: 8  Pain Location: L ribs Pain Descriptors / Indicators: Aching;Guarding;Grimacing Pain Intervention(s): Monitored during session;Limited activity within patient's tolerance;Repositioned    Home Living Family/patient expects to be discharged to:: Private residence Living Arrangements: Spouse/significant other;Children Available Help at Discharge: Family;Available 24 hours/day Type of Home: House Home Access: Stairs to enter Entrance Stairs-Rails: None Entrance Stairs-Number of Steps: 2 Home Layout: One level Home Equipment: None      Prior Function Level of Independence: Independent               Hand Dominance        Extremity/Trunk Assessment   Upper Extremity Assessment Upper Extremity Assessment: Overall WFL for tasks assessed    Lower Extremity Assessment Lower Extremity Assessment: Overall WFL for tasks assessed       Communication   Communication: No difficulties  Cognition Arousal/Alertness: Awake/alert Behavior During Therapy: WFL for tasks assessed/performed Overall Cognitive Status: Within Functional Limits for tasks assessed  General Comments General comments (skin integrity, edema, etc.): Educated about bracing using pillow to help with pain management.     Exercises     Assessment/Plan    PT Assessment Patient needs continued PT services  PT Problem List Decreased  activity tolerance;Decreased mobility;Decreased knowledge of use of DME;Pain       PT Treatment Interventions Gait training;Functional mobility training;Stair training;Therapeutic activities;Balance training;Therapeutic exercise;Patient/family education    PT Goals (Current goals can be found in the Care Plan section)  Acute Rehab PT Goals Patient Stated Goal: to feel better PT Goal Formulation: With patient Time For Goal Achievement: 02/13/20 Potential to Achieve Goals: Good    Frequency Min 3X/week   Barriers to discharge        Co-evaluation               AM-PAC PT "6 Clicks" Mobility  Outcome Measure Help needed turning from your back to your side while in a flat bed without using bedrails?: A Little Help needed moving from lying on your back to sitting on the side of a flat bed without using bedrails?: A Little Help needed moving to and from a bed to a chair (including a wheelchair)?: A Little Help needed standing up from a chair using your arms (e.g., wheelchair or bedside chair)?: A Little Help needed to walk in hospital room?: A Little Help needed climbing 3-5 steps with a railing? : A Little 6 Click Score: 18    End of Session Equipment Utilized During Treatment: Gait belt Activity Tolerance: Patient tolerated treatment well Patient left: in bed;with call bell/phone within reach;with bed alarm set Nurse Communication: Mobility status PT Visit Diagnosis: Muscle weakness (generalized) (M62.81);Unsteadiness on feet (R26.81);Pain Pain - Right/Left: Left Pain - part of body: (ribs)    Time: 1021-1173 PT Time Calculation (min) (ACUTE ONLY): 17 min   Charges:   PT Evaluation $PT Eval Moderate Complexity: 1 Mod          Farley Ly, PT, DPT  Acute Rehabilitation Services  Pager: [phone] Office: [phone]   Lehman Prom 01/30/2020, 6:06 PM

## 2020-01-31 LAB — RETICULOCYTES
Immature Retic Fract: 31.5 % — ABNORMAL HIGH (ref 2.3–15.9)
RBC.: 2.44 MIL/uL — ABNORMAL LOW (ref 3.87–5.11)
Retic Count, Absolute: 82 10*3/uL (ref 19.0–186.0)
Retic Ct Pct: 3.4 % — ABNORMAL HIGH (ref 0.4–3.1)

## 2020-01-31 LAB — URINE CULTURE: Culture: NO GROWTH

## 2020-01-31 LAB — CBC WITH DIFFERENTIAL/PLATELET
Abs Immature Granulocytes: 0.03 10*3/uL (ref 0.00–0.07)
Basophils Absolute: 0 10*3/uL (ref 0.0–0.1)
Basophils Relative: 0 %
Eosinophils Absolute: 0.1 10*3/uL (ref 0.0–0.5)
Eosinophils Relative: 2 %
HCT: 20.1 % — ABNORMAL LOW (ref 36.0–46.0)
Hemoglobin: 6.8 g/dL — CL (ref 12.0–15.0)
Immature Granulocytes: 1 %
Lymphocytes Relative: 27 %
Lymphs Abs: 1.5 10*3/uL (ref 0.7–4.0)
MCH: 27.8 pg (ref 26.0–34.0)
MCHC: 33.8 g/dL (ref 30.0–36.0)
MCV: 82 fL (ref 80.0–100.0)
Monocytes Absolute: 0.5 10*3/uL (ref 0.1–1.0)
Monocytes Relative: 9 %
Neutro Abs: 3.5 10*3/uL (ref 1.7–7.7)
Neutrophils Relative %: 61 %
Platelets: 93 10*3/uL — ABNORMAL LOW (ref 150–400)
RBC: 2.45 MIL/uL — ABNORMAL LOW (ref 3.87–5.11)
RDW: 19.6 % — ABNORMAL HIGH (ref 11.5–15.5)
WBC: 5.6 10*3/uL (ref 4.0–10.5)
nRBC: 0 % (ref 0.0–0.2)

## 2020-01-31 LAB — COMPREHENSIVE METABOLIC PANEL
ALT: 35 U/L (ref 0–44)
AST: 138 U/L — ABNORMAL HIGH (ref 15–41)
Albumin: 2.1 g/dL — ABNORMAL LOW (ref 3.5–5.0)
Alkaline Phosphatase: 122 U/L (ref 38–126)
Anion gap: 6 (ref 5–15)
BUN: 20 mg/dL (ref 6–20)
CO2: 21 mmol/L — ABNORMAL LOW (ref 22–32)
Calcium: 6.9 mg/dL — ABNORMAL LOW (ref 8.9–10.3)
Chloride: 111 mmol/L (ref 98–111)
Creatinine, Ser: 1.77 mg/dL — ABNORMAL HIGH (ref 0.44–1.00)
GFR calc Af Amer: 39 mL/min — ABNORMAL LOW (ref >60)
GFR calc non Af Amer: 34 mL/min — ABNORMAL LOW (ref >60)
Glucose, Bld: 87 mg/dL (ref 70–99)
Potassium: 3.2 mmol/L — ABNORMAL LOW (ref 3.5–5.1)
Sodium: 138 mmol/L (ref 135–145)
Total Bilirubin: 1.1 mg/dL (ref 0.3–1.2)
Total Protein: 4.7 g/dL — ABNORMAL LOW (ref 6.5–8.1)

## 2020-01-31 LAB — HEMOGLOBIN AND HEMATOCRIT, BLOOD
HCT: 25.7 % — ABNORMAL LOW (ref 36.0–46.0)
Hemoglobin: 8.6 g/dL — ABNORMAL LOW (ref 12.0–15.0)

## 2020-01-31 LAB — PREPARE RBC (CROSSMATCH)

## 2020-01-31 LAB — PROTIME-INR
INR: 1.5 — ABNORMAL HIGH (ref 0.8–1.2)
Prothrombin Time: 17.8 seconds — ABNORMAL HIGH (ref 11.4–15.2)

## 2020-01-31 LAB — MAGNESIUM: Magnesium: 1.2 mg/dL — ABNORMAL LOW (ref 1.7–2.4)

## 2020-01-31 MED ORDER — SODIUM CHLORIDE 0.9 % IV SOLN: INTRAVENOUS | Status: AC

## 2020-01-31 MED ORDER — SODIUM CHLORIDE 0.9% IV SOLUTION: Freq: Once | INTRAVENOUS | Status: DC

## 2020-01-31 MED ORDER — MAGNESIUM SULFATE 2 GM/50ML IV SOLN
2.0000 g | Freq: Once | INTRAVENOUS | Status: AC
  Administered 2020-01-31: 10:00:00 2 g via INTRAVENOUS
  Filled 2020-01-31: qty 50

## 2020-01-31 MED ORDER — POTASSIUM CHLORIDE CRYS ER 20 MEQ PO TBCR
40.0000 meq | EXTENDED_RELEASE_TABLET | Freq: Once | ORAL | Status: AC
  Administered 2020-01-31: 40 meq via ORAL
  Filled 2020-01-31: qty 2

## 2020-01-31 MED ORDER — CARVEDILOL 25 MG PO TABS
25.0000 mg | ORAL_TABLET | Freq: Two times a day (BID) | ORAL | Status: DC
  Administered 2020-01-31 – 2020-02-01 (×2): 25 mg via ORAL
  Filled 2020-01-31 (×2): qty 1

## 2020-01-31 MED ORDER — METRONIDAZOLE 500 MG PO TABS
500.0000 mg | ORAL_TABLET | Freq: Three times a day (TID) | ORAL | Status: DC
  Administered 2020-01-31 – 2020-02-01 (×3): 500 mg via ORAL
  Filled 2020-01-31 (×3): qty 1

## 2020-01-31 NOTE — Progress Notes (Signed)
PROGRESS NOTE  Allison [name] UKG:[id number] DOB: July 13, 1971 DOA: 01/29/2020 PCP: Claiborne Rigg, NP  HPI/Recap of past 24 hours:   hgb dropped to 6.8 this am, another unit blood ordered She reports has not had a period for a yr then her period came on since Monday, has slowed down today, denies ab pain No other source of bleeding reported  left rib pain has improved aaox3, no  tremor, denies halluciantion  Assessment/Plan: Active Problems:   Sepsis (HCC)   AKI (acute kidney injury) (HCC)   Acute blood loss anemia   Alcohol withdrawal (HCC)   Lactic acidosis   Fall at home, initial encounter   Thrombocytopenia (HCC)   Acute colitis   Fracture of one rib, left side, initial encounter for closed fracture  Sepsis? Patient is noted on presentation to be tachycardic, with leukocytosis and elevated lactic acid (4.5).  She also has elevated creatinine.   Etiologies include possible acute colitis. CT ab" Inflammatory changes surrounding the distal sigmoid colon consistent with acute colitis or diverticulitis. No perforation, fluid collection, or abscess." cdiff negative, gi pcr panel negative She does not have any evidence of pneumonia.   Urinalysis + for bacteria,  urine culture no growth  Blood culture in process  She received 2 L of IV fluids in the emergency room.  Hemodynamics are currently stable.  Heart rate has improved.  Repeat lactic acid has normalized   Continue antibiotics with Rocephin and Flagyl. F/u on blood culture  AKI Baseline cr wnl, cr on presentation was 2.38 Ct ab on presentation on obstructive nephropathy,  Urine culture no growth, cr improved, today is 1.77, will cancel  renal US since cr has improved and ct ab no evidence of obstructive nephrolathy Continue hydration reduce  Rate from 125cc /hr to 75cc/hr, plan for another 24hrs ivf  Renal dosing meds, continue to hold home meds cozaar Repeat bmp in am  Normocytic Anemia, acute on chronic Likely  from heavy menses and chronic alcohol use fobt negative Iron panel has improved from three yrs ago, b12 unremarkable, folate decreased at 4.8  retic count  inappropriately low suggest component of anemia of chronic disease  s/p 2prbc on 2/3, hgb dropped from 8 to 6.8 this am, will give another prbc, repeat cbc in am She is advised to follow up with pcp and gyn  Thrombocytopenia From alcohol? Does has stable borderline splenomegaly on ct Monitor F/u with pcp  Hypokalemia/hypomagnesemia Remain low, continue to Replace, repeat lab in am Keep on tele  lft elevation Likely from fatty liver and alcohol   CT ab " Diffuse fatty infiltration of the liver.Calcified gallstones are identified without cholecystitis. No biliary dilatation."  HTN: increase coreg, now bp start to increase hold cozaar due to aki  Fall at home with 9th rib fracture with left sided rib pain, reports she was trying to quit drinking, got up and fell, denies dizziness, denies chest pain Topical lidocaine patch and prn norco  Alcohol abuse/alcohol withdrawal:  cessation education provided, she reports trying to quit, she denies h/o alcohol withdrawal seizure. Social worker consulted  On CIWA protocol  DVT Prophylaxis:scd's in the setting on severe anemia  Code Status: full  Family Communication: patient   Disposition Plan: possible home in 24-48hrs pending hgb, cr and clinical improvement   Consultants:  Child psychotherapist  Procedures:  none  Antibiotics:  Rocephin/flagyl   Objective: BP 136/66   Pulse 76   Temp 98.8 F (37.1 C) (Oral)  Resp 12   Ht 5\' 2"  (1.575 m)   Wt 63.5 kg   SpO2 99%   BMI 25.61 kg/m   Intake/Output Summary (Last 24 hours) at 01/31/2020 0727 Last data filed at 01/30/2020 1800 Gross per 24 hour  Intake 2913.41 ml  Output 300 ml  Net 2613.41 ml   Filed Weights   01/29/20 0840  Weight: 63.5 kg    Exam: Patient is examined daily including today on 01/31/2020, exams  remain the same as of yesterday except that has changed    General:  more alert, appear stronger, oriented x3  Cardiovascular: RRR  Respiratory: CTABL  Abdomen: Soft/ND/NT, positive BS  Musculoskeletal: No Edema  Neuro: more alert, oriented   Data Reviewed: Basic Metabolic Panel: Recent Labs  Lab 01/29/20 0812 01/30/20 0047 01/31/20 0421  NA 139 137 138  K 3.4* 3.1* 3.2*  CL 97* 103 111  CO2 20* 23 21*  GLUCOSE 128* 95 87  BUN 15 24* 20  CREATININE 2.38* 2.33* 1.77*  CALCIUM 8.3* 6.9* 6.9*  MG  --  1.0* 1.2*  PHOS  --  1.8*  --    Liver Function Tests: Recent Labs  Lab 01/29/20 0812 01/30/20 0047 01/31/20 0421  AST 172* 135* 138*  ALT 44 31 35  ALKPHOS 171* 124 122  BILITOT 2.2* 1.5* 1.1  PROT 7.2 5.5* 4.7*  ALBUMIN 3.1* 2.6* 2.1*   Recent Labs  Lab 01/29/20 0812  LIPASE 47   No results for input(s): AMMONIA in the last 168 hours. CBC: Recent Labs  Lab 01/29/20 0812 01/30/20 0047 01/31/20 0421  WBC 12.5*  --  5.6  NEUTROABS 10.7*  --  3.5  HGB 5.8* 8.0* 6.8*  HCT 18.4* 23.7* 20.1*  MCV 81.8  --  82.0  PLT 115*  --  93*   Cardiac Enzymes:   No results for input(s): CKTOTAL, CKMB, CKMBINDEX, TROPONINI in the last 168 hours. BNP (last 3 results) No results for input(s): BNP in the last 8760 hours.  ProBNP (last 3 results) No results for input(s): PROBNP in the last 8760 hours.  CBG: No results for input(s): GLUCAP in the last 168 hours.  Recent Results (from the past 240 hour(s))  SARS CORONAVIRUS 2 (TAT 6-24 HRS) Nasopharyngeal Nasopharyngeal Swab     Status: None   Collection Time: 01/29/20  3:32 PM   Specimen: Nasopharyngeal Swab  Result Value Ref Range Status   SARS Coronavirus 2 NEGATIVE NEGATIVE Final    Comment: (NOTE) SARS-CoV-2 target nucleic acids are NOT DETECTED. The SARS-CoV-2 RNA is generally detectable in upper and lower respiratory specimens during the acute phase of infection. Negative results do not preclude  SARS-CoV-2 infection, do not rule out co-infections with other pathogens, and should not be used as the sole basis for treatment or other patient management decisions. Negative results must be combined with clinical observations, patient history, and epidemiological information. The expected result is Negative. Fact Sheet for Patients: 03/28/20 Fact Sheet for Healthcare Providers: HairSlick.no This test is not yet approved or cleared by the [employer]ier[employer]irigir.com FDA and  has been authorized for detection and/or diagnosis of SARS-CoV-2 by FDA under an Emergency Use Authorization (EUA). This EUA will remain  in effect (meaning this test can be used) for the duration of the COVID-19 declaration under Section 56 4(b)(1) of the Act, 21 U.S.C. section 360bbb-3(b)(1), unless the authorization is terminated or revoked sooner. Performed at [facility], [address] [place], [place]  MRSA PCR Screening     Status: None   Collection Time: 01/29/20  7:25 PM   Specimen: Nasal Mucosa; Nasopharyngeal  Result Value Ref Range Status   MRSA by PCR NEGATIVE NEGATIVE Final    Comment:        The GeneXpert MRSA Assay (FDA approved for NASAL specimens only), is one component of a comprehensive MRSA colonization surveillance program. It is not intended to diagnose MRSA infection nor to guide or monitor treatment for MRSA infections. Performed at [facility], [place] [address] [place], [place] [address]   C difficile quick scan w PCR reflex     Status: None   Collection Time: 01/30/20  6:57 PM   Specimen: STOOL  Result Value Ref Range Status   C Diff antigen NEGATIVE NEGATIVE Final   C Diff toxin NEGATIVE NEGATIVE Final   C Diff interpretation No C. difficile detected.  Final    Comment: Performed at [facility], [place] [address] [place], [place] [address]     Studies: No results found.  Scheduled Meds: .  sodium chloride   Intravenous Once  . carvedilol  12.5 mg Oral BID WC  . folic acid  1 mg Oral Daily  . lidocaine  1 patch Transdermal Q24H  . LORazepam  0-4 mg Intravenous Q4H   Followed by  . LORazepam  0-4 mg Intravenous Q8H  . multivitamin with minerals  1 tablet Oral Daily  . pantoprazole  40 mg Oral Daily  . potassium chloride  40 mEq Oral Once  . thiamine  100 mg Oral Daily   Or  . thiamine  100 mg Intravenous Daily    Continuous Infusions: . sodium chloride 125 mL/hr at 01/31/20 0412  . cefTRIAXone (ROCEPHIN)  IV Stopped (01/30/20 1410)  . magnesium sulfate bolus IVPB    . metronidazole 500 mg (01/30/20 2341)     Time spent: 73mins I have personally reviewed and interpreted on  01/31/2020 daily labs, tele strips, imagings as discussed above under date review session and assessment and plans.  I reviewed all nursing notes, pharmacy notes,  vitals, pertinent old records  I have discussed plan of care as described above with RN , patient  on 01/31/2020   Florencia Reasons MD, PhD, FACP  [facility]  Available via Epic secure chat 7am-7pm for nonurgent issues Please page for urgent issues, pager number available through [email or web address] .   01/31/2020, 7:27 AM  LOS: 2 days

## 2020-01-31 NOTE — Progress Notes (Signed)
Physical Therapy Treatment & Discharge Patient Details Name: ARRYN [name] MRN: [id number] DOB: 06-Aug-1971 Today's Date: 01/31/2020    History of Present Illness Pt is a 49 y/o female admitted secondary to sepsis and alcohol withdrawal. Pt also with fall that resulted in L 9th rib fx. Imaging found acute colitis. PMH includes HTN, alcohol abuse, and iron deficiency anemia.     PT Comments    Patient progressing to independent with mobility and demonstrates stair negotiation appropriate for home entry.  No further skilled PT needs.  PT to sign off.    Follow Up Recommendations  No PT follow up     Equipment Recommendations  3in1 (PT)    Recommendations for Other Services       Precautions / Restrictions Precautions Precautions: None    Mobility  Bed Mobility Overal bed mobility: Modified Independent             General bed mobility comments: sitting up in bed upon my entry with bed in flat position, to sitting back on bed with novel technique no assist needed  Transfers Overall transfer level: Independent                  Ambulation/Gait Ambulation/Gait assistance: Independent Gait Distance (Feet): 350 Feet Assistive device: None       General Gait Details: no assist needed, good pace   Stairs Stairs: Yes Stairs assistance: Supervision Stair Management: One rail Left;Alternating pattern;Forwards Number of Stairs: 2(practiced x 2) General stair comments: educated in safety not carrying items on steps for awhile and holding wall or "speaker" she normally holds to negotiate steps   Wheelchair Mobility    Modified Rankin (Stroke Patients Only)       Balance Overall balance assessment: No apparent balance deficits (not formally assessed)   Sitting balance-Leahy Scale: Good       Standing balance-Leahy Scale: Good                              Cognition Arousal/Alertness: Awake/alert Behavior During Therapy: WFL for tasks  assessed/performed Overall Cognitive Status: Within Functional Limits for tasks assessed                                        Exercises      General Comments        Pertinent Vitals/Pain Pain Assessment: Faces Faces Pain Scale: No hurt    Home Living                      Prior Function            PT Goals (current goals can now be found in the care plan section) Progress towards PT goals: Goals met/education completed, patient discharged from PT    Frequency    Min 3X/week      PT Plan Current plan remains appropriate    Co-evaluation              AM-PAC PT "6 Clicks" Mobility   Outcome Measure  Help needed turning from your back to your side while in a flat bed without using bedrails?: None Help needed moving from lying on your back to sitting on the side of a flat bed without using bedrails?: None Help needed moving to and from a bed to a chair (including a wheelchair)?:  None Help needed standing up from a chair using your arms (e.g., wheelchair or bedside chair)?: None Help needed to walk in hospital room?: None Help needed climbing 3-5 steps with a railing? : A Little 6 Click Score: 23    End of Session   Activity Tolerance: Patient tolerated treatment well Patient left: in bed;with call bell/phone within reach   PT Visit Diagnosis: Muscle weakness (generalized) (M62.81);Unsteadiness on feet (R26.81)     Time: 4734-0370 PT Time Calculation (min) (ACUTE ONLY): 16 min  Charges:  $Gait Training: 8-22 mins                     [place] [phone] 01/31/2020    Reginia Naas 01/31/2020, 4:56 PM

## 2020-01-31 NOTE — Progress Notes (Addendum)
CRITICAL VALUE ALERT  Critical Value: Hgb 6.8  Date & Time Notied:  01/31/20 at 0550  Provider Notified: Janyth Contes   Orders Received/Actions taken: transfuse RBCs

## 2020-01-31 NOTE — Progress Notes (Signed)
PIV consult: Site obtained by bedside nurse. Cancel consult.

## 2020-02-01 DIAGNOSIS — K529 Noninfective gastroenteritis and colitis, unspecified: Secondary | ICD-10-CM

## 2020-02-01 DIAGNOSIS — D62 Acute posthemorrhagic anemia: Secondary | ICD-10-CM

## 2020-02-01 LAB — BPAM RBC
Blood Product Expiration Date: 202102102359
Blood Product Expiration Date: 202102282359
Blood Product Expiration Date: 202103022359
ISSUE DATE / TIME: 202102031136
ISSUE DATE / TIME: 202102031602
ISSUE DATE / TIME: 202102050623
Unit Type and Rh: 5100
Unit Type and Rh: 5100
Unit Type and Rh: 9500

## 2020-02-01 LAB — TYPE AND SCREEN
ABO/RH(D): O POS
Antibody Screen: NEGATIVE
Unit division: 0
Unit division: 0
Unit division: 0

## 2020-02-01 LAB — CBC
HCT: 25.4 % — ABNORMAL LOW (ref 36.0–46.0)
Hemoglobin: 8.4 g/dL — ABNORMAL LOW (ref 12.0–15.0)
MCH: 27.9 pg (ref 26.0–34.0)
MCHC: 33.1 g/dL (ref 30.0–36.0)
MCV: 84.4 fL (ref 80.0–100.0)
Platelets: 110 10*3/uL — ABNORMAL LOW (ref 150–400)
RBC: 3.01 MIL/uL — ABNORMAL LOW (ref 3.87–5.11)
RDW: 18.9 % — ABNORMAL HIGH (ref 11.5–15.5)
WBC: 4.5 10*3/uL (ref 4.0–10.5)
nRBC: 0 % (ref 0.0–0.2)

## 2020-02-01 LAB — COMPREHENSIVE METABOLIC PANEL
ALT: 39 U/L (ref 0–44)
AST: 136 U/L — ABNORMAL HIGH (ref 15–41)
Albumin: 2.2 g/dL — ABNORMAL LOW (ref 3.5–5.0)
Alkaline Phosphatase: 131 U/L — ABNORMAL HIGH (ref 38–126)
Anion gap: 9 (ref 5–15)
BUN: 15 mg/dL (ref 6–20)
CO2: 17 mmol/L — ABNORMAL LOW (ref 22–32)
Calcium: 7.4 mg/dL — ABNORMAL LOW (ref 8.9–10.3)
Chloride: 110 mmol/L (ref 98–111)
Creatinine, Ser: 1.53 mg/dL — ABNORMAL HIGH (ref 0.44–1.00)
GFR calc Af Amer: 46 mL/min — ABNORMAL LOW (ref >60)
GFR calc non Af Amer: 40 mL/min — ABNORMAL LOW (ref >60)
Glucose, Bld: 109 mg/dL — ABNORMAL HIGH (ref 70–99)
Potassium: 3.3 mmol/L — ABNORMAL LOW (ref 3.5–5.1)
Sodium: 136 mmol/L (ref 135–145)
Total Bilirubin: 1.1 mg/dL (ref 0.3–1.2)
Total Protein: 5.1 g/dL — ABNORMAL LOW (ref 6.5–8.1)

## 2020-02-01 LAB — MAGNESIUM: Magnesium: 1.4 mg/dL — ABNORMAL LOW (ref 1.7–2.4)

## 2020-02-01 MED ORDER — HYDRALAZINE HCL 20 MG/ML IJ SOLN
5.0000 mg | Freq: Once | INTRAMUSCULAR | Status: AC
  Administered 2020-02-01: 5 mg via INTRAVENOUS
  Filled 2020-02-01: qty 1

## 2020-02-01 MED ORDER — LOSARTAN POTASSIUM 50 MG PO TABS: 50.0000 mg | ORAL_TABLET | Freq: Every day | ORAL | 0 refills | Status: AC

## 2020-02-01 MED ORDER — POTASSIUM CHLORIDE CRYS ER 20 MEQ PO TBCR
40.0000 meq | EXTENDED_RELEASE_TABLET | Freq: Once | ORAL | Status: AC
  Administered 2020-02-01: 40 meq via ORAL
  Filled 2020-02-01: qty 2

## 2020-02-01 MED ORDER — VITAMIN B-1 100 MG PO TABS: 100.0000 mg | ORAL_TABLET | Freq: Every day | ORAL | 0 refills | Status: DC

## 2020-02-01 MED ORDER — AMOXICILLIN-POT CLAVULANATE 875-125 MG PO TABS: 1.0000 | ORAL_TABLET | Freq: Two times a day (BID) | ORAL | 0 refills | Status: AC

## 2020-02-01 MED ORDER — FERROUS SULFATE 325 (65 FE) MG PO TABS: 325.0000 mg | ORAL_TABLET | ORAL | 0 refills | Status: DC

## 2020-02-01 MED ORDER — CARVEDILOL 25 MG PO TABS: 25.0000 mg | ORAL_TABLET | Freq: Two times a day (BID) | ORAL | 0 refills | Status: DC

## 2020-02-01 MED ORDER — MAGNESIUM SULFATE 2 GM/50ML IV SOLN
2.0000 g | Freq: Once | INTRAVENOUS | Status: AC
  Administered 2020-02-01: 2 g via INTRAVENOUS
  Filled 2020-02-01: qty 50

## 2020-02-01 NOTE — Progress Notes (Signed)
Pt states she is eager to get home to her family.   Pt given D/C education and all questions answered. No printed prescriptions to give or equipment to deliver. IV removed. Pt taken to car with all belongings.

## 2020-02-01 NOTE — Progress Notes (Signed)
Pts. BP was 186/92 while in bed resting. Administered pain meds per pt stating pain was 8/10. Rechecked BP and is now 179/90. Pt. is asymptomatic and denies headache/vision changes. HR is 72, O2 99, and RR 15. Paged provider on call for medication order regarding high BP. 1x dose of 5mg  Hydralazine administered per order. No patient distress noted upon exiting room.

## 2020-02-01 NOTE — Discharge Summary (Signed)
Discharge Summary  Allison Dessracy R Gali WGN:[id number] DOB: [birth year]  PCP: Claiborne RiggFleming, Zelda W, NP  Admit date: 01/29/2020 Discharge date: 02/01/2020  Time spent: 45mins, more than 50% time spent on coordination of care.  Recommendations for Outpatient Follow-up:  1. F/u with PCP within a week  for hospital discharge follow up, repeat cbc/bmp at follow up. Patient is advised to check blood pressure at home and bring in record for pcp to review for further blood pressure meds adjustment if indicated 2. F/u with gyn for heavy menses 3. F/u with [facility] for alcohol abstinence   Discharge Diagnoses:  Active Hospital Problems   Diagnosis Date Noted  . AKI (acute kidney injury) (HCC) 01/29/2020  . Acute blood loss anemia 01/29/2020  . Alcohol withdrawal (HCC) 01/29/2020  . Lactic acidosis 01/29/2020  . Fall at home, initial encounter 01/29/2020  . Thrombocytopenia (HCC) 01/29/2020  . Acute colitis 01/29/2020  . Fracture of one rib, left side, initial encounter for closed fracture 01/29/2020  . Sepsis (HCC) 05/22/2016    Resolved Hospital Problems  No resolved problems to display.    Discharge Condition: stable  Diet recommendation: heart healthy  Filed Weights   01/29/20 0840  Weight: 63.5 kg    History of present illness: ( per admitting MD Dr Kerry HoughMemon) PCP: Claiborne RiggFleming, Zelda W, NP  Patient coming from: home  I have personally briefly reviewed patient[facility]  Chief Complaint: alcohol withdrawal  HPI: Allison [name] is a 49 y.o. female with medical history significant of hypertension, alcohol abuse, iron deficiency anemia, who stopped drinking approximately 48 hours ago.  Patient had been a heavy drinker, but over the past week she decided she wanted to stop drinking.  She stopped approximately 48 hours ago and since then has had shaking, nausea, vomiting, diaphoresis, lightheadedness on standing.  She has been unable to keep anything down by  mouth.  She has had some loose stools, but these have been brown in color.  Denies any melena or hematochezia.  She is not had any hematemesis.  She has not had any fever, cough, shortness of breath.  She had a fall approximately 2 days ago where she fell on her left side.  She does have some soreness in her left ribs/left flank.  She also reports that she recently started her menstrual cycle.  Her cycle has been irregular for several months and she has not any significant flow.  She reports noticing large clots over the last several days.  ED Course: On arrival to the emergency room she is noted to be hypertensive and tachycardic.  She received Librium and Ativan for alcohol withdrawal.  Basic labs were checked that showed significant lactic acidosis with a lactic acid of 4.5 and an anion gap of 22.  She was also noted to be significantly anemic with a hemoglobin of 5.8.  Baseline hemoglobin runs 8-9.  She reports that she has not been taking her iron supplements.  Other lab work showed a significant acute kidney injury with a creatinine of 2.38.  CT of the abdomen pelvis performed that did not show any internal bleeding, but did comment on possible sigmoid colitis.  She has been referred for admission.   Hospital Course:  Active Problems:   Sepsis (HCC)   AKI (acute kidney injury) (HCC)   Acute blood loss anemia   Alcohol withdrawal (HCC)   Lactic acidosis   Fall at home, initial encounter   Thrombocytopenia (HCC)   Acute  colitis   Fracture of one rib, left side, initial encounter for closed fracture   Possible Sepsis from colitis -Patient is noted on presentation to be tachycardic, with leukocytosis and elevated lactic acid (4.5). She also has elevated creatinine on presentation. -Etiologies include possible acute colitis. CT ab"Inflammatory changes surrounding the distal sigmoid colon consistent with acute colitis or diverticulitis. No perforation, fluid collection, or abscess." -cdiff  negative, gi pcr panel negative -She does not have any evidence of pneumonia.  -Urinalysis + for bacteria,  urine culture no growth  -Blood culture no growth -SARS COV2 screening negative -She is treated with ivf and rocephin/flagyl. Repeat lactic acid has normalized , leukocytosis resolved, n/v resolved. She tolerated regular diet, she is discharged on augmentin to finish abx treatment.   AKI on CKDII Baseline cr wnl, cr on presentation was 2.38 Ct ab on presentation on obstructive nephropathy,  Urine culture no growth,  Cr has improved, cr 1.5 at discharge, home meds cozaar held since admission, resumed at discharge. She is to follow up with pcp to repeat bmp   Normocytic Anemia, acute on chronic Likely from heavy menses and chronic alcohol use fobt negative Iron panel has improved from three yrs ago, b12 unremarkable, folate decreased at 4.8  retic count  inappropriately low suggest component of anemia of chronic disease  s/p prbcx3 , hgb stable above 8x2days at discharge, menses has slowed down She is advised to follow up with pcp and gyn  Thrombocytopenia From alcohol? Does has stable borderline splenomegaly on ct Nadir at 93, improved, plt 110 at discharge F/u with pcp  Hypokalemia/hypomagnesemia Replaced, pcp to follow up   lft elevation, AST>ALT Likely from fatty liver and alcohol   CT ab " Diffuse fatty infiltration of the liver.Calcified gallstones are identified without cholecystitis. No biliary dilatation." Avoid alcohol F/u with pcp  HTN: coreg dose increased  cozaar held in the hospital due to [facility], resumed at discharge She is advised to check her blood pressure at home and F/u with pcp  Fall at home with 9th rib fracture with left sided rib pain, reports she was trying to quit drinking, got up and fell, denies dizziness, denies chest pain Topical lidocaine patch and prn norco improved  Alcohol abuse/alcohol withdrawal:  cessation education  provided, she reports trying to quit, she denies h/o alcohol withdrawal seizure. Social worker consulted  She has some tremor initially, has done well for the past two days. No confusion, no hallucination.   Medication and medical follow up noncompliance , reports due to insurance issues, reports new insurance card is getting mailed to her.  DVT Prophylaxis while in the hospital :scd's in the setting on severe anemia  Code Status: full  Family Communication: patient   Disposition Plan:  home    Consultants:  Child psychotherapist  Procedures:  none  Antibiotics:  Rocephin/flagyl   Discharge Exam: BP (!) 171/90   Pulse 81   Temp 98.6 F (37 C)   Resp 14   Ht 5\' 2"  (1.575 m)   Wt 63.5 kg   SpO2 94%   BMI 25.61 kg/m   General: NAD, AAOx3 Cardiovascular: RRR Respiratory: CTABL  Discharge Instructions You were cared for by a hospitalist during your hospital stay. If you have any questions about your discharge medications or the care you received while you were in the hospital after you are discharged, you can call the unit and asked to speak with the hospitalist on call if the hospitalist that took care of  you is not available. Once you are discharged, your primary care physician will handle any further medical issues. Please note that NO REFILLS for any discharge medications will be authorized once you are discharged, as it is imperative that you return to your primary care physician (or establish a relationship with a primary care physician if you do not have one) for your aftercare needs so that they can reassess your need for medications and monitor your lab values.  Discharge Instructions    Diet - low sodium heart healthy   Complete by: As directed    Increase activity slowly   Complete by: As directed      Allergies as of 02/01/2020      Reactions   Lisinopril Cough      Medication List    STOP taking these medications   pantoprazole 40 MG  tablet Commonly known as: PROTONIX     TAKE these medications   amoxicillin-clavulanate 875-125 MG tablet Commonly known as: Augmentin Take 1 tablet by mouth 2 (two) times daily for 4 days.   carvedilol 25 MG tablet Commonly known as: COREG Take 1 tablet (25 mg total) by mouth 2 (two) times daily with a meal. What changed:   medication strength  how much to take   ferrous sulfate 325 (65 FE) MG tablet Take 1 tablet (325 mg total) by mouth every Monday, Wednesday, and Friday. Start taking on: February 03, 2020 What changed: when to take this   folic acid 1 MG tablet Commonly known as: FOLVITE Take 1 tablet (1 mg total) by mouth daily.   losartan 50 MG tablet Commonly known as: COZAAR Take 1 tablet (50 mg total) by mouth daily.   thiamine 100 MG tablet Commonly known as: VITAMIN B-1 Take 1 tablet (100 mg total) by mouth daily.      Allergies  Allergen Reactions  . Lisinopril Cough   Follow-up Information    Ob/Gyn, [facility] Follow up.   Why: for heavy menses and anemia Contact information: [address] [place] [address] [phone]        [facility] Follow up in 1 week(s).   Why: hospital discharge follow up, repeat cbc/cmp. please check your blood pressure at home twice a day, bring your blood pressure records to your doctor for further blood pressure medication adjustment  Contact information: [address] [place] [address] [phone]       [facility] Follow up.   Why: for alcohol abstinence  Contact information: [address] [place] [address] [phone]            The results of significant diagnostics from this hospitalization (including imaging, microbiology, ancillary and laboratory) are listed below for reference.    Significant Diagnostic Studies: CT ABDOMEN PELVIS WO CONTRAST  Result Date: 01/29/2020 CLINICAL DATA:  Left upper quadrant abdominal pain, alcohol  withdrawals EXAM: CT ABDOMEN AND PELVIS WITHOUT CONTRAST TECHNIQUE: Multidetector CT imaging of the abdomen and pelvis was performed following the standard protocol without IV contrast. COMPARISON:  05/23/2016 FINDINGS: Lower chest: No acute pleural or parenchymal lung disease. Hepatobiliary: Diffuse fatty infiltration of the liver again seen. Calcified gallstones are identified without cholecystitis. No biliary dilatation. Pancreas: Pancreas is unremarkable. Spleen: Spleen is borderline enlarged measuring 12.3 cm in anterior-posterior dimension. This is stable. No focal splenic parenchymal abnormalities. Adrenals/Urinary Tract: Adrenals are unremarkable. No urinary tract calculi or obstructive uropathy. Bladder is unremarkable. Stomach/Bowel: There is no bowel obstruction or ileus.  There is a normal appendix in the right lower quadrant. There are prominent inflammatory changes surrounding the distal sigmoid colon within the lower pelvis, consistent with acute colitis or diverticulitis. No fluid collection, perforation, or abscess. Vascular/Lymphatic: No pathologic adenopathy within the abdomen or pelvis. Reproductive: Uterus and adnexal structures are age-appropriate. Other: No abdominal wall hernia or abnormality. No abdominopelvic ascites. Musculoskeletal: No acute or destructive bony lesions. Reconstructed images demonstrate no additional findings. IMPRESSION: 1. Inflammatory changes surrounding the distal sigmoid colon consistent with acute colitis or diverticulitis. No perforation, fluid collection, or abscess. 2. Diffuse fatty infiltration of the liver. 3. Cholelithiasis without cholecystitis. 4. Stable borderline splenomegaly. Electronically Signed   By: Sharlet Salina M.D.   On: 01/29/2020 13:17   DG Chest Port 1 View  Result Date: 01/29/2020 CLINICAL DATA:  Left chest wall pain secondary to a fall. EXAM: PORTABLE CHEST 1 VIEW COMPARISON:  Chest x-ray dated 03/01/2018 FINDINGS: The heart size and  pulmonary vascularity are normal. Lungs are clear. There is a fracture of the posterolateral aspect of the left ninth rib. This was not visible on the prior study but this may not be acute. No lung contusion or pneumothorax. IMPRESSION: Fracture of the posterolateral aspect of the left ninth rib which may not be acute. Electronically Signed   By: Francene Boyers M.D.   On: 01/29/2020 08:45    Microbiology: Recent Results (from the past 240 hour(s))  SARS CORONAVIRUS 2 (TAT 6-24 HRS) Nasopharyngeal Nasopharyngeal Swab     Status: None   Collection Time: 01/29/20  3:32 PM   Specimen: Nasopharyngeal Swab  Result Value Ref Range Status   SARS Coronavirus 2 NEGATIVE NEGATIVE Final    Comment: (NOTE) SARS-CoV-2 target nucleic acids are NOT DETECTED. The SARS-CoV-2 RNA is generally detectable in upper and lower respiratory specimens during the acute phase of infection. Negative results do not preclude SARS-CoV-2 infection, do not rule out co-infections with other pathogens, and should not be used as the sole basis for treatment or other patient management decisions. Negative results must be combined with clinical observations, patient history, and epidemiological information. The expected result is Negative. Fact Sheet for Patients: HairSlick.no Fact Sheet for Healthcare Providers: quierodirigir.com This test is not yet approved or cleared by the [place] FDA and  has been authorized for detection and/or diagnosis of SARS-CoV-2 by FDA under an Emergency Use Authorization (EUA). This EUA will remain  in effect (meaning this test can be used) for the duration of the COVID-19 declaration under Section 56 4(b)(1) of the Act, 21 U.S.C. section 360bbb-3(b)(1), unless the authorization is terminated or revoked sooner. Performed at [facility], [address] [place], [place] [address]   MRSA PCR Screening     Status: None    Collection Time: 01/29/20  7:25 PM   Specimen: Nasal Mucosa; Nasopharyngeal  Result Value Ref Range Status   MRSA by PCR NEGATIVE NEGATIVE Final    Comment:        The GeneXpert MRSA Assay (FDA approved for NASAL specimens only), is one component of a comprehensive MRSA colonization surveillance program. It is not intended to diagnose MRSA infection nor to guide or monitor treatment for MRSA infections. Performed at [facility], [address] [place], [place] [address]   Blood culture (routine x 2)     Status: None (Preliminary result)   Collection Time: 01/30/20 12:24 AM   Specimen: BLOOD  Result Value Ref Range Status   Specimen Description BLOOD LEFT  ANTECUBITAL  Final   Special Requests   Final    BOTTLES DRAWN AEROBIC AND ANAEROBIC Blood Culture adequate volume   Culture   Final    NO GROWTH 2 DAYS Performed at [facility], [address], [place], [place] [address]    Report Status PENDING  Incomplete  Blood culture (routine x 2)     Status: None (Preliminary result)   Collection Time: 01/30/20  1:33 AM   Specimen: BLOOD  Result Value Ref Range Status   Specimen Description BLOOD LEFT ANTECUBITAL  Final   Special Requests   Final    BOTTLES DRAWN AEROBIC AND ANAEROBIC Blood Culture adequate volume   Culture   Final    NO GROWTH 2 DAYS Performed at [facility], [place] [address], [place], [place] [address]    Report Status PENDING  Incomplete  Culture, Urine     Status: None   Collection Time: 01/30/20  3:35 PM   Specimen: Urine, Random  Result Value Ref Range Status   Specimen Description URINE, RANDOM  Final   Special Requests NONE  Final   Culture   Final    NO GROWTH Performed at [facility], [place] [address], [place], [place] [address]    Report Status 01/31/2020 FINAL  Final  C difficile quick scan w PCR reflex     Status: None   Collection Time: 01/30/20  6:57 PM   Specimen: STOOL  Result Value Ref Range Status   C Diff  antigen NEGATIVE NEGATIVE Final   C Diff toxin NEGATIVE NEGATIVE Final   C Diff interpretation No C. difficile detected.  Final    Comment: Performed at [facility], [place] [address] [place], [place] [address]     Labs: Basic Metabolic Panel: Recent Labs  Lab 01/29/20 [phone] 01/30/20 0047 01/31/20 0421 02/01/20 0228  NA 139 137 138 136  K 3.4* 3.1* 3.2* 3.3*  CL 97* 103 111 110  CO2 20* 23 21* 17*  GLUCOSE 128* 95 87 109*  BUN 15 24* 20 15  CREATININE 2.38* 2.33* 1.77* 1.53*  CALCIUM 8.3* 6.9* 6.9* 7.4*  MG  --  1.0* 1.2* 1.4*  PHOS  --  1.8*  --   --    Liver Function Tests: Recent Labs  Lab 01/29/20 0812 01/30/20 0047 01/31/20 0421 02/01/20 0228  AST 172* 135* 138* 136*  ALT 44 31 35 39  ALKPHOS 171* 124 122 131*  BILITOT 2.2* 1.5* 1.1 1.1  PROT 7.2 5.5* 4.7* 5.1*  ALBUMIN 3.1* 2.6* 2.1* 2.2*   Recent Labs  Lab 01/29/20 0812  LIPASE 47   No results for input(s): AMMONIA in the last 168 hours. CBC: Recent Labs  Lab 01/29/20 0812 01/30/20 0047 01/31/20 0421 01/31/20 2101 02/01/20 0228  WBC 12.5*  --  5.6  --  4.5  NEUTROABS 10.7*  --  3.5  --   --   HGB 5.8* 8.0* 6.8* 8.6* 8.4*  HCT 18.4* 23.7* 20.1* 25.7* 25.4*  MCV 81.8  --  82.0  --  84.4  PLT 115*  --  93*  --  110*   Cardiac Enzymes: No results for input(s): CKTOTAL, CKMB, CKMBINDEX, TROPONINI in the last 168 hours. BNP: BNP (last 3 results) No results for input(s): BNP in the last 8760 hours.  ProBNP (last 3 results) No results for input(s): PROBNP in the last 8760 hours.  CBG: No results for input(s): GLUCAP in the last 168  hours.     Signed:  Albertine Grates MD, PhD, FACP  [facility] 02/01/2020, 4:18 PM

## 2020-02-03 ENCOUNTER — Telehealth: Payer: Self-pay

## 2020-02-03 LAB — GI PATHOGEN PANEL BY PCR, STOOL

## 2020-02-03 NOTE — Telephone Encounter (Signed)
Transition Care Management Follow-up Telephone Call Date of discharge and from where: 02/01/2020, [facility]  Call placed to patient # [phone], call went to voicemail but it has not been set up yet.  Unable to leave a message.   It is noted on AVS that patient is to follow up with [facility] in 1 week.  Need to confirm if the patient is following up there or needs to schedule an appt at [facility]

## 2020-02-04 ENCOUNTER — Telehealth: Payer: Self-pay

## 2020-02-04 LAB — CULTURE, BLOOD (ROUTINE X 2)
Culture: NO GROWTH
Culture: NO GROWTH
Special Requests: ADEQUATE
Special Requests: ADEQUATE

## 2020-02-04 NOTE — Telephone Encounter (Signed)
Transition Care Management Follow-up Telephone Call   - attempt #2   Date of discharge and from where: 02/01/2020, [facility]  Call placed to patient # [phone], call went to voicemail but it has not been set up yet.  Unable to leave a message.   It is noted on AVS that patient is to follow up with [facility] in 1 week.  Need to confirm if the patient is following up there or needs to schedule an appt at [facility].  Letter sent to patient informing her that we have not been able to reach her and she can call the clinic to schedule an appointment if she wishes.

## 2020-05-06 ENCOUNTER — Other Ambulatory Visit: Payer: Self-pay | Admitting: Internal Medicine

## 2020-05-06 ENCOUNTER — Other Ambulatory Visit: Payer: Self-pay | Admitting: Nurse Practitioner

## 2020-05-06 DIAGNOSIS — Z1231 Encounter for screening mammogram for malignant neoplasm of breast: Secondary | ICD-10-CM

## 2020-05-11 ENCOUNTER — Ambulatory Visit: Payer: Self-pay

## 2020-06-06 ENCOUNTER — Emergency Department (HOSPITAL_COMMUNITY)
Admission: EM | Admit: 2020-06-06 | Discharge: 2020-06-07 | Disposition: A | Discharge status: Home / Self Care | Payer: 59 | Attending: Emergency Medicine | Admitting: Emergency Medicine

## 2020-06-06 ENCOUNTER — Other Ambulatory Visit: Payer: Self-pay

## 2020-06-06 ENCOUNTER — Encounter (HOSPITAL_COMMUNITY): Payer: Self-pay

## 2020-06-06 DIAGNOSIS — R1011 Right upper quadrant pain: Secondary | ICD-10-CM | POA: Insufficient documentation

## 2020-06-06 DIAGNOSIS — R0789 Other chest pain: Secondary | ICD-10-CM | POA: Insufficient documentation

## 2020-06-06 DIAGNOSIS — Y999 Unspecified external cause status: Secondary | ICD-10-CM | POA: Insufficient documentation

## 2020-06-06 DIAGNOSIS — Y929 Unspecified place or not applicable: Secondary | ICD-10-CM | POA: Insufficient documentation

## 2020-06-06 DIAGNOSIS — R109 Unspecified abdominal pain: Secondary | ICD-10-CM

## 2020-06-06 DIAGNOSIS — S300XXA Contusion of lower back and pelvis, initial encounter: Secondary | ICD-10-CM | POA: Insufficient documentation

## 2020-06-06 DIAGNOSIS — S3992XA Unspecified injury of lower back, initial encounter: Secondary | ICD-10-CM | POA: Diagnosis present

## 2020-06-06 DIAGNOSIS — Y9389 Activity, other specified: Secondary | ICD-10-CM | POA: Insufficient documentation

## 2020-06-06 DIAGNOSIS — Z7141 Alcohol abuse counseling and surveillance of alcoholic: Secondary | ICD-10-CM | POA: Insufficient documentation

## 2020-06-06 DIAGNOSIS — R197 Diarrhea, unspecified: Secondary | ICD-10-CM | POA: Insufficient documentation

## 2020-06-06 DIAGNOSIS — R112 Nausea with vomiting, unspecified: Secondary | ICD-10-CM | POA: Diagnosis not present

## 2020-06-06 DIAGNOSIS — I10 Essential (primary) hypertension: Secondary | ICD-10-CM | POA: Diagnosis not present

## 2020-06-06 DIAGNOSIS — Z79899 Other long term (current) drug therapy: Secondary | ICD-10-CM | POA: Insufficient documentation

## 2020-06-06 LAB — COMPREHENSIVE METABOLIC PANEL
ALT: 87 U/L — ABNORMAL HIGH (ref 0–44)
AST: 559 U/L — ABNORMAL HIGH (ref 15–41)
Albumin: 3.6 g/dL (ref 3.5–5.0)
Alkaline Phosphatase: 256 U/L — ABNORMAL HIGH (ref 38–126)
Anion gap: 17 — ABNORMAL HIGH (ref 5–15)
BUN: 9 mg/dL (ref 6–20)
CO2: 20 mmol/L — ABNORMAL LOW (ref 22–32)
Calcium: 8.3 mg/dL — ABNORMAL LOW (ref 8.9–10.3)
Chloride: 105 mmol/L (ref 98–111)
Creatinine, Ser: 1.01 mg/dL — ABNORMAL HIGH (ref 0.44–1.00)
GFR calc Af Amer: >60 mL/min (ref >60)
GFR calc non Af Amer: >60 mL/min (ref >60)
Glucose, Bld: 110 mg/dL — ABNORMAL HIGH (ref 70–99)
Potassium: 3.3 mmol/L — ABNORMAL LOW (ref 3.5–5.1)
Sodium: 142 mmol/L (ref 135–145)
Total Bilirubin: 1.2 mg/dL (ref 0.3–1.2)
Total Protein: 8.2 g/dL — ABNORMAL HIGH (ref 6.5–8.1)

## 2020-06-06 LAB — CBC
HCT: 36.3 % (ref 36.0–46.0)
Hemoglobin: 11.3 g/dL — ABNORMAL LOW (ref 12.0–15.0)
MCH: 25.2 pg — ABNORMAL LOW (ref 26.0–34.0)
MCHC: 31.1 g/dL (ref 30.0–36.0)
MCV: 81 fL (ref 80.0–100.0)
Platelets: 281 10*3/uL (ref 150–400)
RBC: 4.48 MIL/uL (ref 3.87–5.11)
RDW: 19 % — ABNORMAL HIGH (ref 11.5–15.5)
WBC: 7.4 10*3/uL (ref 4.0–10.5)
nRBC: 0 % (ref 0.0–0.2)

## 2020-06-06 LAB — I-STAT BETA HCG BLOOD, ED (MC, WL, AP ONLY): I-stat hCG, quantitative: <5 m[IU]/mL (ref <5)

## 2020-06-06 LAB — LIPASE, BLOOD: Lipase: 21 U/L (ref 11–51)

## 2020-06-06 MED ORDER — SODIUM CHLORIDE 0.9% FLUSH
3.0000 mL | Freq: Once | INTRAVENOUS | Status: AC
  Administered 2020-06-07: 3 mL via INTRAVENOUS
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
## 2020-06-06 NOTE — ED Triage Notes (Signed)
Pt arrives to ED w/ c/o n/v/d, ruq 8/10 abdominal pain, and chills.

## 2020-06-07 ENCOUNTER — Emergency Department (HOSPITAL_COMMUNITY): Payer: 59

## 2020-06-07 DIAGNOSIS — S300XXA Contusion of lower back and pelvis, initial encounter: Secondary | ICD-10-CM | POA: Diagnosis not present

## 2020-06-07 LAB — URINALYSIS, ROUTINE W REFLEX MICROSCOPIC
Bilirubin Urine: NEGATIVE
Glucose, UA: NEGATIVE mg/dL
Ketones, ur: 20 mg/dL — AB
Leukocytes,Ua: NEGATIVE
Nitrite: POSITIVE — AB
Protein, ur: 100 mg/dL — AB
Specific Gravity, Urine: >1.046 — ABNORMAL HIGH (ref 1.005–1.030)
pH: 6 (ref 5.0–8.0)

## 2020-06-07 LAB — HEPATITIS PANEL, ACUTE
HCV Ab: NONREACTIVE
Hep A IgM: NONREACTIVE
Hep B C IgM: NONREACTIVE
Hepatitis B Surface Ag: NONREACTIVE

## 2020-06-07 LAB — ACETAMINOPHEN LEVEL: Acetaminophen (Tylenol), Serum: <10 ug/mL — ABNORMAL LOW (ref 10–30)

## 2020-06-07 MED ORDER — THIAMINE HCL 100 MG/ML IJ SOLN
100.0000 mg | Freq: Once | INTRAMUSCULAR | Status: AC
  Administered 2020-06-07: 100 mg via INTRAVENOUS
  Filled 2020-06-07: qty 2

## 2020-06-07 MED ORDER — IOHEXOL 300 MG/ML  SOLN
100.0000 mL | Freq: Once | INTRAMUSCULAR | Status: AC | PRN
  Administered 2020-06-07: 100 mL via INTRAVENOUS

## 2020-06-07 MED ORDER — FENTANYL CITRATE (PF) 100 MCG/2ML IJ SOLN
50.0000 ug | Freq: Once | INTRAMUSCULAR | Status: AC
  Administered 2020-06-07: 50 ug via INTRAVENOUS
  Filled 2020-06-07: qty 2

## 2020-06-07 MED ORDER — MORPHINE SULFATE (PF) 4 MG/ML IV SOLN
4.0000 mg | Freq: Once | INTRAVENOUS | Status: AC
  Administered 2020-06-07: 4 mg via INTRAVENOUS
  Filled 2020-06-07: qty 1

## 2020-06-07 MED ORDER — LORAZEPAM 2 MG/ML IJ SOLN
0.0000 mg | Freq: Four times a day (QID) | INTRAMUSCULAR | Status: DC
  Administered 2020-06-07: 1 mg via INTRAVENOUS
  Filled 2020-06-07: qty 1

## 2020-06-07 MED ORDER — CHLORDIAZEPOXIDE HCL 25 MG PO CAPS: ORAL_CAPSULE | ORAL | 0 refills | Status: DC

## 2020-06-07 MED ORDER — LORAZEPAM 1 MG PO TABS: 0.0000 mg | ORAL_TABLET | Freq: Four times a day (QID) | ORAL | Status: DC

## 2020-06-07 MED ORDER — ONDANSETRON HCL 4 MG/2ML IJ SOLN
4.0000 mg | Freq: Once | INTRAMUSCULAR | Status: AC
  Administered 2020-06-07: 4 mg via INTRAVENOUS
  Filled 2020-06-07: qty 2

## 2020-06-07 MED ORDER — SODIUM CHLORIDE 0.9 % IV BOLUS
1000.0000 mL | Freq: Once | INTRAVENOUS | Status: AC
  Administered 2020-06-07: 1000 mL via INTRAVENOUS

## 2020-06-07 MED ORDER — LORAZEPAM 2 MG/ML IJ SOLN: 0.0000 mg | Freq: Two times a day (BID) | INTRAMUSCULAR | Status: DC

## 2020-06-07 MED ORDER — IOHEXOL 300 MG/ML  SOLN: 100.0000 mL | Freq: Once | INTRAMUSCULAR | Status: DC | PRN

## 2020-06-07 MED ORDER — LORAZEPAM 2 MG/ML IJ SOLN
1.0000 mg | Freq: Once | INTRAMUSCULAR | Status: AC
  Administered 2020-06-07: 1 mg via INTRAVENOUS
  Filled 2020-06-07: qty 1

## 2020-06-07 MED ORDER — FOSFOMYCIN TROMETHAMINE 3 G PO PACK
3.0000 g | PACK | Freq: Once | ORAL | Status: AC
  Administered 2020-06-07: 3 g via ORAL
  Filled 2020-06-07: qty 3

## 2020-06-07 MED ORDER — DICYCLOMINE HCL 10 MG PO CAPS: 10.0000 mg | ORAL_CAPSULE | Freq: Once | ORAL | Status: DC

## 2020-06-07 MED ORDER — LORAZEPAM 1 MG PO TABS: 0.0000 mg | ORAL_TABLET | Freq: Two times a day (BID) | ORAL | Status: DC

## 2020-06-07 MED ORDER — LOSARTAN POTASSIUM 50 MG PO TABS
50.0000 mg | ORAL_TABLET | Freq: Once | ORAL | Status: AC
  Administered 2020-06-07: 50 mg via ORAL
  Filled 2020-06-07: qty 1

## 2020-06-07 MED ORDER — LIDOCAINE 5 % EX PTCH
1.0000 | MEDICATED_PATCH | CUTANEOUS | Status: DC
  Administered 2020-06-07: 1 via TRANSDERMAL
  Filled 2020-06-07: qty 1

## 2020-06-07 MED ORDER — CARVEDILOL 12.5 MG PO TABS
25.0000 mg | ORAL_TABLET | Freq: Two times a day (BID) | ORAL | Status: DC
  Administered 2020-06-07 (×2): 25 mg via ORAL
  Filled 2020-06-07 (×2): qty 2

## 2020-06-07 NOTE — ED Notes (Signed)
Ordered Breakfast--Allison [name]  

## 2020-06-07 NOTE — ED Notes (Signed)
Pt vital signs stable, pt in NAD at this time. Per Lelon Mast, [place] pt removed from cardiac monitoring at this time.

## 2020-06-07 NOTE — ED Notes (Signed)
Pt ambulated to restroom. 

## 2020-06-07 NOTE — ED Notes (Signed)
Patient verbalizes understanding of discharge instructions. Opportunity for questioning and answers were provided. Armband removed by staff, pt discharged from ED.  

## 2020-06-07 NOTE — ED Notes (Signed)
Pt transported to CT ?

## 2020-06-07 NOTE — ED Notes (Signed)
Patient transported to Ultrasound 
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
## 2020-06-08 LAB — URINE CULTURE: Culture: NO GROWTH

## 2020-07-16 ENCOUNTER — Other Ambulatory Visit: Payer: Self-pay | Admitting: Internal Medicine

## 2020-07-16 DIAGNOSIS — N6321 Unspecified lump in the left breast, upper outer quadrant: Secondary | ICD-10-CM

## 2020-08-06 IMAGING — CT CT ABD-PELV W/ CM
2 of 5 series · 16 of 46 positions shown, 18 images · IV contrast (omnipaque)
Comparison: 01/29/2020

CLINICAL DATA: Nausea and vomiting with abdominal pain.

EXAM:
CT ABDOMEN AND PELVIS WITH CONTRAST
TECHNIQUE: Multidetector CT imaging of the abdomen and pelvis was performed
using the standard protocol following bolus administration of
intravenous contrast.
CONTRAST:  100mL OMNIPAQUE IOHEXOL 300 MG/ML  SOLN

[Series 3: a/p w/ 5mm · axial · 0.98mm/px · z∈[-215,+210]mm · 13 of 97 slices shown, 15 images]
[im 6/97  soft-tissue]
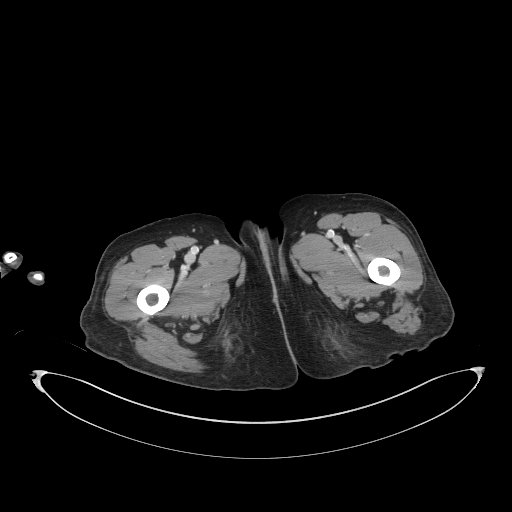
[im 6/97  bone]
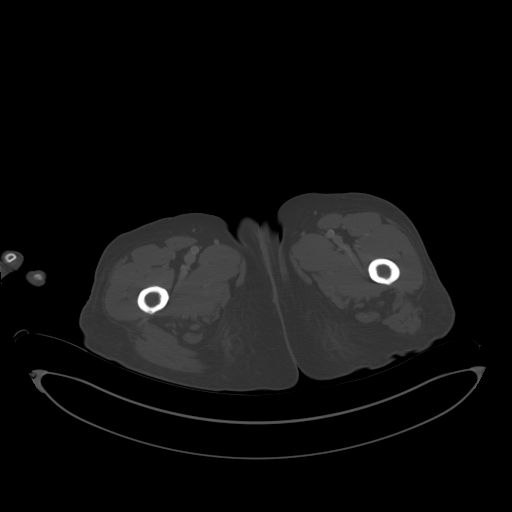
[im 16/97  soft-tissue]
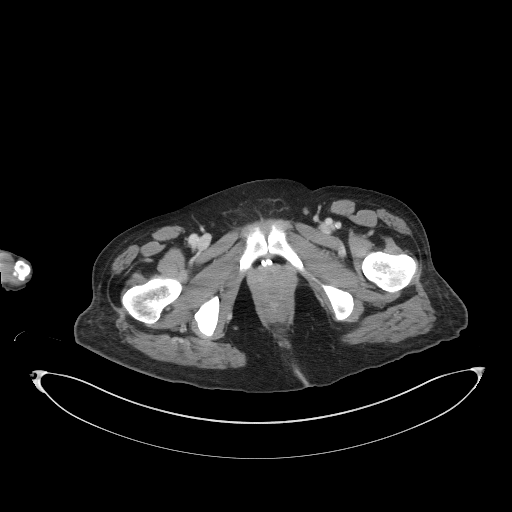
[im 21/97  soft-tissue]
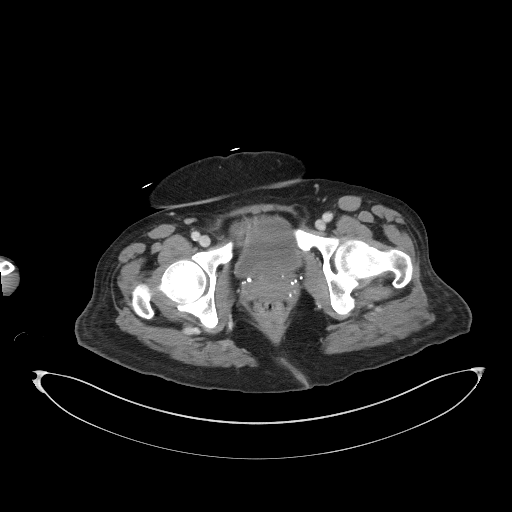
[im 26/97  soft-tissue]
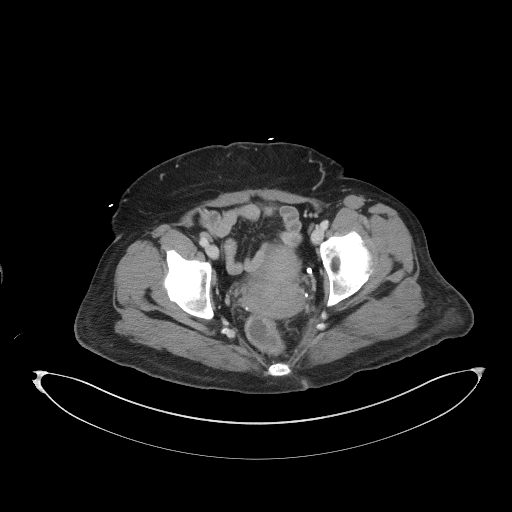
[im 36/97  soft-tissue]
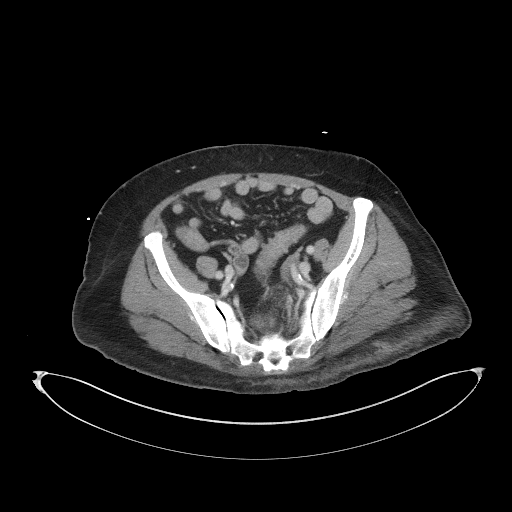
[im 41/97  soft-tissue]
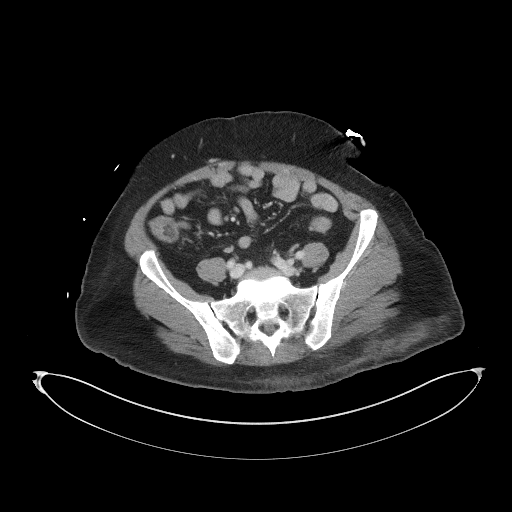
[im 51/97  soft-tissue]
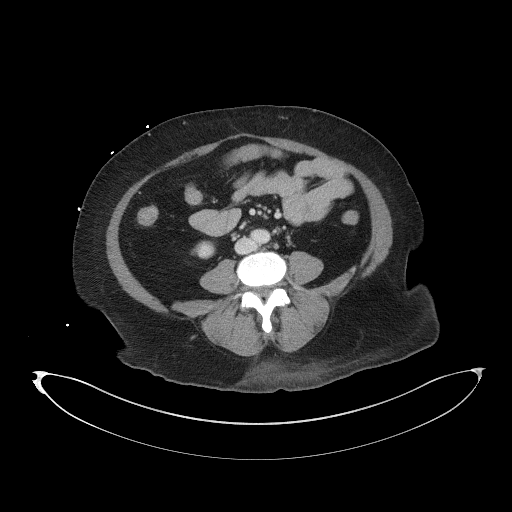
[im 56/97  soft-tissue]
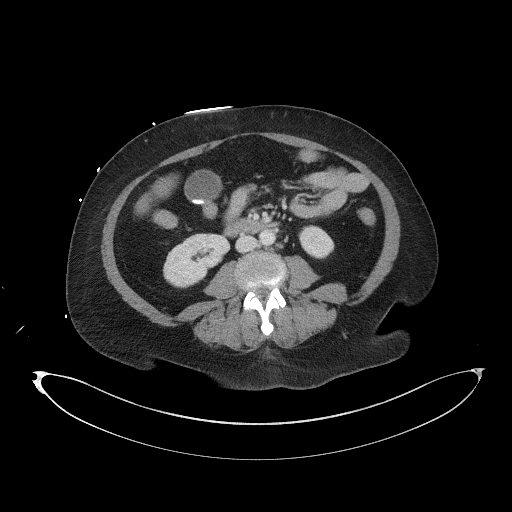
[im 61/97  soft-tissue]
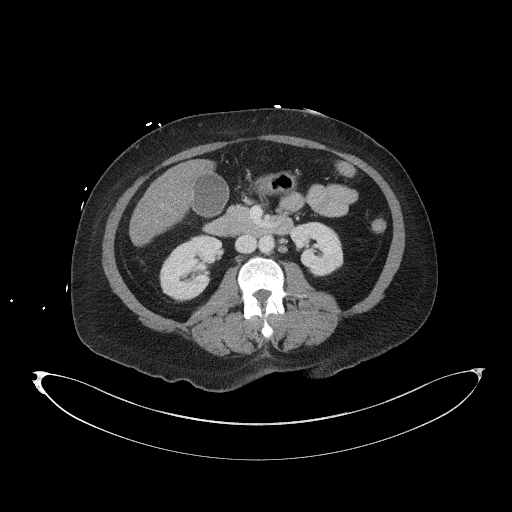
[im 61/97  bone]
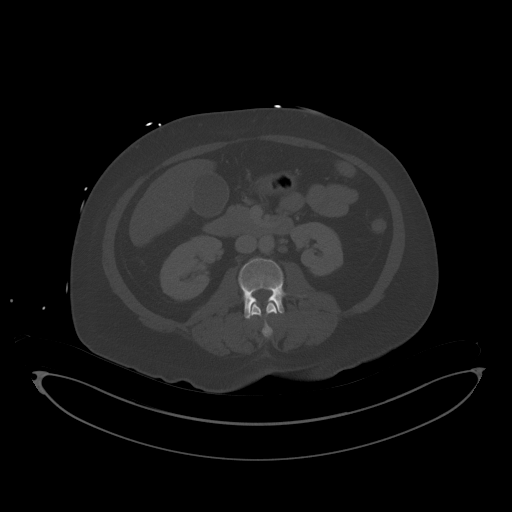
[im 71/97  soft-tissue]
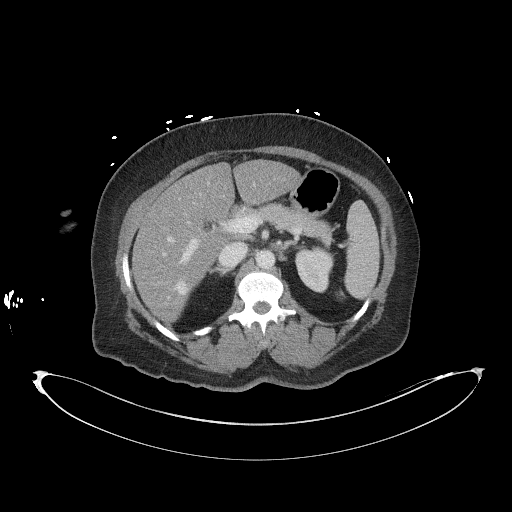
[im 76/97  soft-tissue]
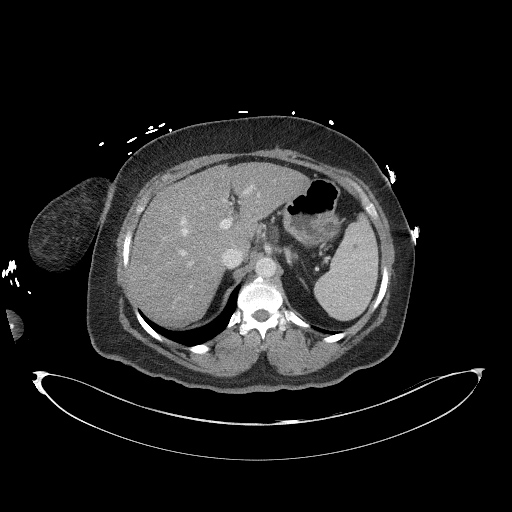
[im 81/97  soft-tissue]
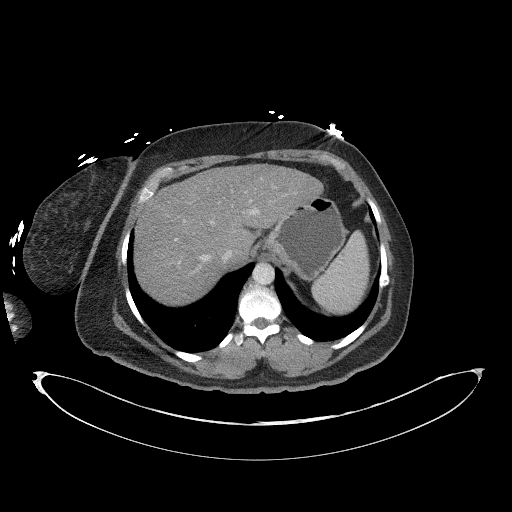
[im 91/97  soft-tissue]
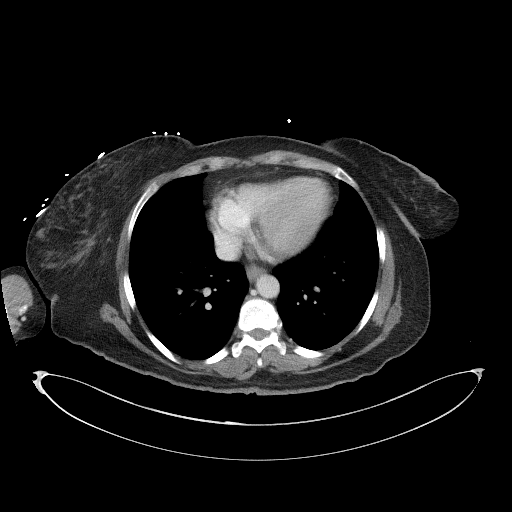

[Series 6: a/p w/ cor · coronal · 0.94mm/px · 3 of 169 slices shown]
[im 57/169  soft-tissue]
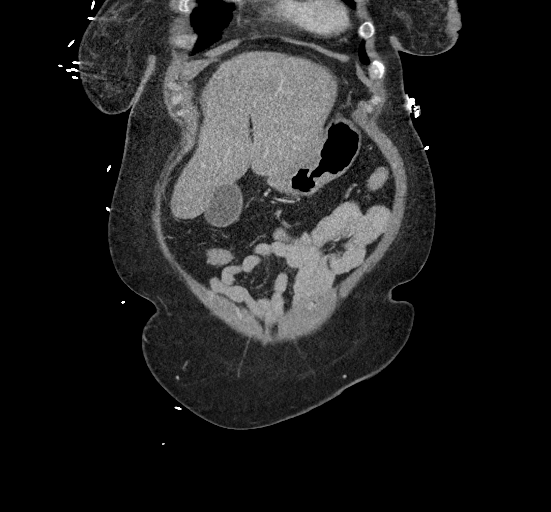
[im 75/169  soft-tissue]
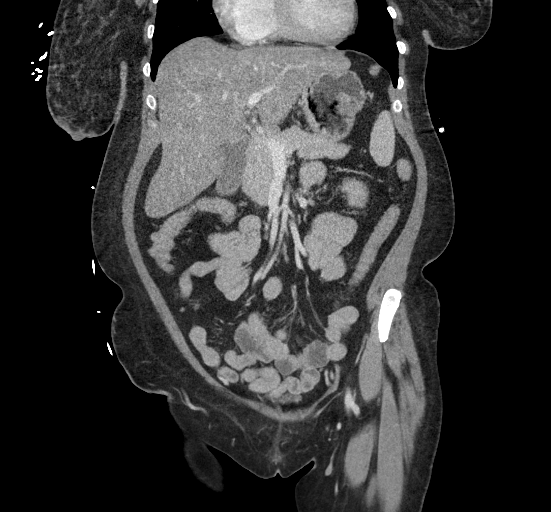
[im 94/169  soft-tissue]
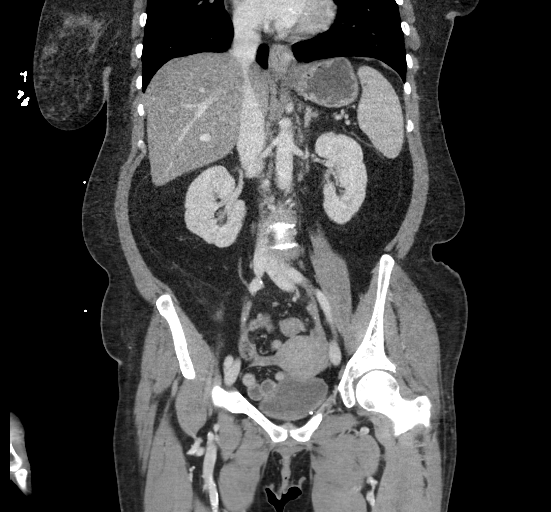

[16 of 46 positions shown; findings below may reference images not displayed]

FINDINGS: Lower chest: The lung bases are clear. The heart size is normal.

Hepatobiliary: There is a patent steatosis. There is a 1.4 cm
hyperattenuating lesion in hepatic segment 6 (axial series 3, image
27). This is essentially stable from prior study and may represent a
flash filling hemangioma are vascular malformation. This lesion was
seen on the patient's MRI from 1422 and is of doubtful clinical
significance. Cholelithiasis without acute inflammation.There is no
biliary ductal dilation.

Pancreas: Normal contours without ductal dilatation. No
peripancreatic fluid collection.

Spleen: Unremarkable.

Adrenals/Urinary Tract:

--Adrenal glands: Unremarkable.

--Right kidney/ureter: No hydronephrosis or radiopaque kidney
stones.

--Left kidney/ureter: No hydronephrosis or radiopaque kidney stones.

--Urinary bladder: Unremarkable.

Stomach/Bowel:

--Stomach/Duodenum: No hiatal hernia or other gastric abnormality.
Normal duodenal course and caliber.

--Small bowel: Unremarkable.

--Colon: There is scattered colonic diverticula CT evidence for
diverticulitis. Is some mild rectal wall thickening.

--Appendix: Normal.

Vascular/Lymphatic: Atherosclerotic calcification is present within
the non-aneurysmal abdominal aorta, without hemodynamically
significant stenosis.

--No retroperitoneal lymphadenopathy.

--No mesenteric lymphadenopathy.

--No pelvic or inguinal lymphadenopathy.

Reproductive: Unremarkable

Other: No ascites or free air. The abdominal wall is normal.

Musculoskeletal. There is subcutaneous edema/fat stranding in the
left gluteal region with a subcutaneous nodule measuring
approximately 2.4 cm (axial series 3, image 64). There is no
significant skin thickening.
IMPRESSION: 1. Subcutaneous edema in the left gluteal region with a small
associated hematoma. Correlation for history of trauma is
recommended.
2. There is some mild rectal wall thickening. This may be secondary
to underdistention or proctitis.
3. Hepatic steatosis.
4. Cholelithiasis without acute inflammation.

Aortic Atherosclerosis ([id number]-E44.4).

## 2020-10-06 ENCOUNTER — Other Ambulatory Visit: Payer: Self-pay

## 2020-10-06 ENCOUNTER — Encounter (HOSPITAL_COMMUNITY): Payer: Self-pay | Admitting: Emergency Medicine

## 2020-10-06 ENCOUNTER — Emergency Department (HOSPITAL_COMMUNITY)
Admission: EM | Admit: 2020-10-06 | Discharge: 2020-10-09 | Disposition: A | Discharge status: Other Acute Inpatient Hospital | Payer: 59 | Attending: Emergency Medicine | Admitting: Emergency Medicine

## 2020-10-06 DIAGNOSIS — R45850 Homicidal ideations: Secondary | ICD-10-CM | POA: Insufficient documentation

## 2020-10-06 DIAGNOSIS — Z20822 Contact with and (suspected) exposure to covid-19: Secondary | ICD-10-CM | POA: Diagnosis not present

## 2020-10-06 DIAGNOSIS — R451 Restlessness and agitation: Secondary | ICD-10-CM

## 2020-10-06 DIAGNOSIS — F1590 Other stimulant use, unspecified, uncomplicated: Secondary | ICD-10-CM | POA: Insufficient documentation

## 2020-10-06 DIAGNOSIS — F1020 Alcohol dependence, uncomplicated: Secondary | ICD-10-CM

## 2020-10-06 DIAGNOSIS — R45851 Suicidal ideations: Secondary | ICD-10-CM

## 2020-10-06 DIAGNOSIS — F332 Major depressive disorder, recurrent severe without psychotic features: Secondary | ICD-10-CM

## 2020-10-06 DIAGNOSIS — I10 Essential (primary) hypertension: Secondary | ICD-10-CM | POA: Diagnosis not present

## 2020-10-06 DIAGNOSIS — Z046 Encounter for general psychiatric examination, requested by authority: Secondary | ICD-10-CM | POA: Diagnosis present

## 2020-10-06 LAB — COMPREHENSIVE METABOLIC PANEL
ALT: 66 U/L — ABNORMAL HIGH (ref 0–44)
AST: 250 U/L — ABNORMAL HIGH (ref 15–41)
Albumin: 3.9 g/dL (ref 3.5–5.0)
Alkaline Phosphatase: 198 U/L — ABNORMAL HIGH (ref 38–126)
Anion gap: 14 (ref 5–15)
BUN: 6 mg/dL (ref 6–20)
CO2: 29 mmol/L (ref 22–32)
Calcium: 8.2 mg/dL — ABNORMAL LOW (ref 8.9–10.3)
Chloride: 100 mmol/L (ref 98–111)
Creatinine, Ser: 0.85 mg/dL (ref 0.44–1.00)
GFR, Estimated: >60 mL/min (ref >60)
Glucose, Bld: 135 mg/dL — ABNORMAL HIGH (ref 70–99)
Potassium: 2.8 mmol/L — ABNORMAL LOW (ref 3.5–5.1)
Sodium: 143 mmol/L (ref 135–145)
Total Bilirubin: 1.1 mg/dL (ref 0.3–1.2)
Total Protein: 7.9 g/dL (ref 6.5–8.1)

## 2020-10-06 LAB — I-STAT BETA HCG BLOOD, ED (MC, WL, AP ONLY): I-stat hCG, quantitative: <5 m[IU]/mL (ref <5)

## 2020-10-06 LAB — CBC
HCT: 34.1 % — ABNORMAL LOW (ref 36.0–46.0)
Hemoglobin: 11.1 g/dL — ABNORMAL LOW (ref 12.0–15.0)
MCH: 26.9 pg (ref 26.0–34.0)
MCHC: 32.6 g/dL (ref 30.0–36.0)
MCV: 82.6 fL (ref 80.0–100.0)
Platelets: 106 10*3/uL — ABNORMAL LOW (ref 150–400)
RBC: 4.13 MIL/uL (ref 3.87–5.11)
RDW: 19.8 % — ABNORMAL HIGH (ref 11.5–15.5)
WBC: 3.3 10*3/uL — ABNORMAL LOW (ref 4.0–10.5)
nRBC: 0 % (ref 0.0–0.2)

## 2020-10-06 LAB — SALICYLATE LEVEL: Salicylate Lvl: <7 mg/dL — ABNORMAL LOW (ref 7.0–30.0)

## 2020-10-06 LAB — ETHANOL: Alcohol, Ethyl (B): 412 mg/dL (ref <10)

## 2020-10-06 LAB — ACETAMINOPHEN LEVEL: Acetaminophen (Tylenol), Serum: <10 ug/mL — ABNORMAL LOW (ref 10–30)

## 2020-10-06 MED ORDER — LORAZEPAM 2 MG/ML IJ SOLN: 0.0000 mg | Freq: Four times a day (QID) | INTRAMUSCULAR | Status: AC

## 2020-10-06 MED ORDER — POTASSIUM CHLORIDE CRYS ER 20 MEQ PO TBCR
40.0000 meq | EXTENDED_RELEASE_TABLET | Freq: Once | ORAL | Status: AC
  Administered 2020-10-07: 40 meq via ORAL
  Filled 2020-10-06: qty 2

## 2020-10-06 MED ORDER — THIAMINE HCL 100 MG PO TABS
100.0000 mg | ORAL_TABLET | Freq: Every day | ORAL | Status: DC
  Administered 2020-10-07 – 2020-10-09 (×3): 100 mg via ORAL
  Filled 2020-10-06 (×3): qty 1

## 2020-10-06 MED ORDER — LORAZEPAM 1 MG PO TABS
0.0000 mg | ORAL_TABLET | Freq: Two times a day (BID) | ORAL | Status: DC
  Administered 2020-10-09: 1 mg via ORAL
  Filled 2020-10-06: qty 1

## 2020-10-06 MED ORDER — LORAZEPAM 1 MG PO TABS
0.0000 mg | ORAL_TABLET | Freq: Four times a day (QID) | ORAL | Status: AC
  Administered 2020-10-07 (×2): 2 mg via ORAL
  Administered 2020-10-08 (×2): 1 mg via ORAL
  Filled 2020-10-06: qty 1
  Filled 2020-10-06 (×2): qty 2
  Filled 2020-10-06: qty 1

## 2020-10-06 MED ORDER — THIAMINE HCL 100 MG/ML IJ SOLN: 100.0000 mg | Freq: Every day | INTRAMUSCULAR | Status: DC

## 2020-10-06 MED ORDER — LORAZEPAM 2 MG/ML IJ SOLN: 0.0000 mg | Freq: Two times a day (BID) | INTRAMUSCULAR | Status: DC

## 2020-10-06 NOTE — ED Provider Notes (Addendum)
[facility]-EMERGENCY DEPT Provider Note   CSN: [id number] Arrival date & time: 10/06/20  2109     History Chief Complaint  Patient presents with  . IVC    Allison [name] is a 49 y.o. female.  HPI     Patient presents via police under involuntary commitment. The patient herself is awake and alert, providing history, but has little insight into her presentation other than requesting discharge to go home.  Per IVC papers, police, and the patient's cousin, the patient has had recent separation from her husband.  She has reportedly been drinking substantially, and making threats against her husband, others, and suicidal statements. Patient denies pain, denies dyspnea, denies nausea. She denies history of drug use, acknowledges some alcohol use, cannot specify the quantity.  Past Medical History:  Diagnosis Date  . GERD (gastroesophageal reflux disease)   . Hypertension     Patient Active Problem List   Diagnosis Date Noted  . AKI (acute kidney injury) (HCC) 01/29/2020  . Acute blood loss anemia 01/29/2020  . Alcohol withdrawal (HCC) 01/29/2020  . Lactic acidosis 01/29/2020  . Fall at home, initial encounter 01/29/2020  . Thrombocytopenia (HCC) 01/29/2020  . Acute colitis 01/29/2020  . Fracture of one rib, left side, initial encounter for closed fracture 01/29/2020  . Bipolar disorder, unspecified (HCC) 06/10/2019  . Anemia 08/19/2017  . Acute pyelonephritis 05/22/2016  . Pyelonephritis 05/22/2016  . Hyperglycemia 05/22/2016  . Sepsis (HCC) 05/22/2016  . Hypertension 05/22/2016    History reviewed. No pertinent surgical history.   OB History   No obstetric history on file.     Family History  Problem Relation Age of Onset  . Hypertension Mother   . Diabetes Mother   . Hypertension Father     Social History   Tobacco Use  . Smoking status: Never Smoker  . Smokeless tobacco: Never Used  Substance Use Topics  . Alcohol use: Yes     Alcohol/week: 14.0 standard drinks    Types: 14 Standard drinks or equivalent per week    Comment: pint of liquor  . Drug use: Yes    Types: Marijuana    Home Medications Prior to Admission medications   Medication Sig Start Date End Date Taking? Authorizing Provider  carvedilol (COREG) 25 MG tablet Take 1 tablet (25 mg total) by mouth 2 (two) times daily with a meal. Patient not taking: Reported on 06/07/2020 02/01/20   Albertine Grates, MD  chlordiazePOXIDE (LIBRIUM) 25 MG capsule 50mg  PO TID x 1D, then 25-50mg  PO BID X 1D, then 25-50mg  PO QD X 1D 06/07/20   06/09/20, PA-C  ferrous sulfate 325 (65 FE) MG tablet Take 1 tablet (325 mg total) by mouth every Monday, Wednesday, and Friday. Patient not taking: Reported on 06/07/2020 02/03/20   04/02/20, MD  folic acid (FOLVITE) 1 MG tablet Take 1 tablet (1 mg total) by mouth daily. Patient not taking: Reported on 01/29/2020 06/13/19   06/15/19, NP  losartan (COZAAR) 50 MG tablet Take 1 tablet (50 mg total) by mouth daily. Patient not taking: Reported on 06/07/2020 02/01/20   03/31/20, MD  thiamine (VITAMIN B-1) 100 MG tablet Take 1 tablet (100 mg total) by mouth daily. Patient not taking: Reported on 06/07/2020 02/01/20   03/31/20, MD    Allergies    Lisinopril  Review of Systems   Review of Systems  Constitutional:       Per HPI, otherwise negative  HENT:  Per HPI, otherwise negative  Respiratory:       Per HPI, otherwise negative  Cardiovascular:       Per HPI, otherwise negative  Gastrointestinal: Negative for vomiting.  Endocrine:       Negative aside from HPI  Genitourinary:       Neg aside from HPI   Musculoskeletal:       Per HPI, otherwise negative  Skin: Negative.   Neurological: Negative for syncope.  Psychiatric/Behavioral: Positive for agitation. The patient is nervous/anxious.     Physical Exam Updated Vital Signs BP (!) 195/126 (BP Location: Left Arm)   Pulse (!) 102   Temp 98.2 F (36.8 C) (Oral)   Resp 20    Ht 5\' 2"  (1.575 m)   Wt 72.6 kg   SpO2 100%   BMI 29.26 kg/m   Physical Exam Vitals and nursing note reviewed.  Constitutional:      General: She is not in acute distress.    Appearance: She is well-developed.  HENT:     Head: Normocephalic and atraumatic.  Eyes:     Conjunctiva/sclera: Conjunctivae normal.  Cardiovascular:     Rate and Rhythm: Normal rate and regular rhythm.  Pulmonary:     Effort: Pulmonary effort is normal. No respiratory distress.     Breath sounds: Normal breath sounds. No stridor.  Abdominal:     General: There is no distension.  Skin:    General: Skin is warm and dry.  Neurological:     Mental Status: She is alert and oriented to person, place, and time.     Cranial Nerves: No cranial nerve deficit.  Psychiatric:        Mood and Affect: Mood is anxious.        Speech: Speech is tangential.        Behavior: Behavior is agitated.        Thought Content: Thought content is delusional.     Comments: Patient has little insight into circumstances about her presentation.     ED Results / Procedures / Treatments   Labs (all labs ordered are listed, but only abnormal results are displayed) Labs Reviewed  COMPREHENSIVE METABOLIC PANEL - Abnormal; Notable for the following components:      Result Value   Potassium 2.8 (*)    Glucose, Bld 135 (*)    Calcium 8.2 (*)    AST 250 (*)    ALT 66 (*)    Alkaline Phosphatase 198 (*)    All other components within normal limits  CBC - Abnormal; Notable for the following components:   WBC 3.3 (*)    Hemoglobin 11.1 (*)    HCT 34.1 (*)    RDW 19.8 (*)    Platelets 106 (*)    All other components within normal limits  ETHANOL  SALICYLATE LEVEL  ACETAMINOPHEN LEVEL  RAPID URINE DRUG SCREEN, HOSP PERFORMED  I-STAT BETA HCG BLOOD, ED ([facility], [facility], [facility] ONLY)    Procedures Procedures (including critical care time)  Medications Ordered in ED Medications - No data to display  ED Course  I have reviewed the  triage vital signs and the nursing notes.  Pertinent labs & imaging results that were available during my care of the patient were reviewed by me and considered in my medical decision making (see chart for details).    MDM Rules/Calculators/A&P  Patient's initial labs notable for findings consistent with ongoing alcohol abuse, AST, ALT elevated.    Alcohol level greater than 400.  Patient remains awake and alert, interactive   Patient has been started on CIWA protocol, but is otherwise appropriate for behavioral health evaluation.  Adult female with ongoing marital discord, reportedly, presents with ongoing alcohol abuse, delusions, reported homicidal and suicidal statements.  Patient does have some evidence, via labs for ongoing alcohol abuse, but no evidence for decompensated state.  Patient appropriate for behavioral health evaluation. Final Clinical Impression(s) / ED Diagnoses Homicidal ideation Suicidal ideation Alcohol abuse   Gerhard Munch, MD 10/06/20 2322    Gerhard Munch, MD 10/06/20 2325
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
## 2020-10-06 NOTE — ED Notes (Signed)
Patient in and out of room multiple times. PD attempting to redirect patient without success. Patient noted to be cursing and irate that she is here.

## 2020-10-06 NOTE — ED Notes (Signed)
ED Provider at bedside. 

## 2020-10-07 LAB — RAPID URINE DRUG SCREEN, HOSP PERFORMED
Amphetamines: NOT DETECTED
Barbiturates: NOT DETECTED
Benzodiazepines: NOT DETECTED
Cocaine: NOT DETECTED
Opiates: NOT DETECTED
Tetrahydrocannabinol: NOT DETECTED

## 2020-10-07 LAB — RESPIRATORY PANEL BY RT PCR (FLU A&B, COVID)
Influenza A by PCR: NEGATIVE
Influenza B by PCR: NEGATIVE
SARS Coronavirus 2 by RT PCR: NEGATIVE

## 2020-10-07 MED ORDER — LORAZEPAM 1 MG PO TABS
2.0000 mg | ORAL_TABLET | Freq: Once | ORAL | Status: AC
  Administered 2020-10-07: 2 mg via ORAL
  Filled 2020-10-07: qty 2

## 2020-10-07 MED ORDER — ONDANSETRON 8 MG PO TBDP
8.0000 mg | ORAL_TABLET | Freq: Once | ORAL | Status: AC
  Administered 2020-10-07: 8 mg via ORAL
  Filled 2020-10-07: qty 1

## 2020-10-07 MED ORDER — POTASSIUM CHLORIDE CRYS ER 20 MEQ PO TBCR
40.0000 meq | EXTENDED_RELEASE_TABLET | Freq: Once | ORAL | Status: AC
  Administered 2020-10-07: 40 meq via ORAL
  Filled 2020-10-07: qty 2

## 2020-10-07 NOTE — ED Notes (Signed)
Scrubs and urine cup provided to patient for the next time she goes to restroom

## 2020-10-07 NOTE — ED Notes (Signed)
Patient belongings are behind nurses station 23-25. Contains pants, shirt, shoes, a watch, necklace and her ID. Patient changed into scrubs.

## 2020-10-07 NOTE — ED Notes (Signed)
Attempted to get patient dressed. Patient still very somnolent and unable to follow direction or instruction.

## 2020-10-07 NOTE — ED Notes (Addendum)
Attempted to get patient dressed. Patient still very somnolent with difficulty following direction. Patient able to take PO medications after multiple attempts at waking her.

## 2020-10-07 NOTE — ED Notes (Signed)
Patient ambulating to restroom with sitter. Reminded patient to remove all belongings and place in patient belonging bag. Also reminded of urine specimen
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
## 2020-10-07 NOTE — ED Notes (Signed)
Patient is having n/v after receiving potassium.

## 2020-10-07 NOTE — ED Notes (Signed)
Patient sleeping comfortably, rise and fall of chest noted, patient in NAD. Will continue to monitor.

## 2020-10-07 NOTE — BH Assessment (Signed)
[facility] Assessment Progress Note  Per Shuvon Rankin, NP, this pt requires psychiatric hospitalization.  Pt presents under IVC initiated by pt's cousin, and upheld by EDP Tilden Fossa, MD.  Pt is currently under consideration for admission to [facility].  Final disposition is pending as of this writing.  Doylene Canning, [place] Behavioral Health Coordinator [phone]

## 2020-10-07 NOTE — ED Notes (Signed)
Attempted to get patient dressed. Patient still very somnolent and unable to follow direction or instruction.  

## 2020-10-08 DIAGNOSIS — F332 Major depressive disorder, recurrent severe without psychotic features: Secondary | ICD-10-CM

## 2020-10-08 DIAGNOSIS — R45851 Suicidal ideations: Secondary | ICD-10-CM

## 2020-10-08 DIAGNOSIS — F1020 Alcohol dependence, uncomplicated: Secondary | ICD-10-CM

## 2020-10-08 MED ORDER — IBUPROFEN 800 MG PO TABS
800.0000 mg | ORAL_TABLET | Freq: Four times a day (QID) | ORAL | Status: DC | PRN
  Administered 2020-10-08 – 2020-10-09 (×2): 800 mg via ORAL
  Filled 2020-10-08 (×2): qty 1

## 2020-10-08 MED ORDER — CARVEDILOL 12.5 MG PO TABS
25.0000 mg | ORAL_TABLET | Freq: Two times a day (BID) | ORAL | Status: DC
  Administered 2020-10-08 – 2020-10-09 (×3): 25 mg via ORAL
  Filled 2020-10-08 (×3): qty 2

## 2020-10-08 MED ORDER — ACETAMINOPHEN 325 MG PO TABS
650.0000 mg | ORAL_TABLET | ORAL | Status: DC | PRN
  Administered 2020-10-08: 650 mg via ORAL
  Filled 2020-10-08: qty 2

## 2020-10-08 MED ORDER — LOSARTAN POTASSIUM 50 MG PO TABS
50.0000 mg | ORAL_TABLET | Freq: Every day | ORAL | Status: DC
  Administered 2020-10-08 – 2020-10-09 (×2): 50 mg via ORAL
  Filled 2020-10-08 (×2): qty 1

## 2020-10-08 NOTE — BH Assessment (Signed)
Patient has been accepted to [facility].  Accepting physician is Dr. Toni Amend.  Attending  Physician will be Dr. Toni Amend.  Patient has been assigned to room 306, by [facility] Charge Nurse Gigi.   Call report to [phone].  Representative/Transfer Coordinator is Oyindamola Key Patient pre-admitted by [facility] Patient Access Barkley Boards)  [facility] ER Staff Maisie Fus H, TTS) made aware of acceptance.  Bed will be available after 8pm

## 2020-10-08 NOTE — ED Notes (Signed)
[facility]iff transport returned called, stated they will not transport patient until morning after 9am

## 2020-10-08 NOTE — ED Provider Notes (Signed)
Emergency Medicine Observation Re-evaluation Note  Allison [name] is a 49 y.o. female, seen on rounds today.  Pt initially presented to the ED for complaints of IVC Currently, the patient is resting.  Physical Exam  BP (!) 190/109 (BP Location: Right Arm)   Pulse 85   Temp 99 F (37.2 C) (Oral)   Resp 18   Ht 5\' 2"  (1.575 m)   Wt 72.6 kg   SpO2 98%   BMI 29.26 kg/m  Physical Exam CONSTITUTIONAL: Well-appearing, NAD NEURO: Sleeping ENT/NECK:  supple, no JVD CARDIO: Regular rate, well-perfused PULM: No increased work of breathing GI/GU: Nondistended MSK/SPINE:  No gross deformities, no edema SKIN:  no rash, atraumatic PSYCH: Deferred  ED Course / MDM  EKG:    I have reviewed the labs performed to date as well as medications administered while in observation.  Recent changes in the last 24 hours include none.  Plan  Current plan is for inpatient psychiatric placement. Patient is under full IVC at this time.   , MD 10/08/20 [phone]
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
## 2020-10-08 NOTE — ED Notes (Signed)
Sheriff called for transport of patient to [facility], message left.

## 2020-10-08 NOTE — BH Assessment (Addendum)
[facility] Assessment Progress Note  Per Maxie Barb, NP, this pt requires psychiatric hospitalization.  Robinette Haines, Counselor reports that pt has been accepted to [facility]; as of this writing, accepting physician, bed assignment and expected arrival time are pending.  Pt presents under IVC initiated by pt's cousin and upheld by EDP Tilden Fossa, MD, and IVC documents have been faxed to [phone].  Nehemiah Settle, EDP Kennis Carina, MD and pt's nurse, Waynetta Sandy, have been notified, and  Waynetta Sandy agrees to call report to [phone].  Pt is to be transported via [facility] when the time comes.   Doylene Canning, [place] Behavioral Health Coordinator [phone]    Addendum:  Per Jerilynn Som, pt has been accepted to [facility] by Dr Toni Amend to Rm 306.  They will be ready to receive pt after 20:00.  Nehemiah Settle, EDP Meridee Score, MD and [facility] have been notified.  Doylene Canning, [place] Behavioral Health Coordinator [phone]

## 2020-10-08 NOTE — [facility] Counselor (Signed)
This counselor contacted patient's collateral, Norwood Levo, and gave update on treatment recommendations.

## 2020-10-09 ENCOUNTER — Encounter: Payer: Self-pay | Admitting: Psychiatry

## 2020-10-09 ENCOUNTER — Other Ambulatory Visit: Payer: Self-pay

## 2020-10-09 ENCOUNTER — Inpatient Hospital Stay
Admission: AD | Admit: 2020-10-09 | Discharge: 2020-10-10 | DRG: 885 | Disposition: A | Discharge status: Other Acute Inpatient Hospital | Payer: 59 | Source: Other Acute Inpatient Hospital | Attending: Behavioral Health | Admitting: Behavioral Health

## 2020-10-09 DIAGNOSIS — I10 Essential (primary) hypertension: Secondary | ICD-10-CM | POA: Diagnosis present

## 2020-10-09 DIAGNOSIS — F332 Major depressive disorder, recurrent severe without psychotic features: Principal | ICD-10-CM

## 2020-10-09 DIAGNOSIS — R45851 Suicidal ideations: Secondary | ICD-10-CM | POA: Diagnosis present

## 2020-10-09 DIAGNOSIS — F1020 Alcohol dependence, uncomplicated: Secondary | ICD-10-CM | POA: Diagnosis not present

## 2020-10-09 DIAGNOSIS — Z8249 Family history of ischemic heart disease and other diseases of the circulatory system: Secondary | ICD-10-CM

## 2020-10-09 DIAGNOSIS — F10231 Alcohol dependence with withdrawal delirium: Secondary | ICD-10-CM | POA: Diagnosis present

## 2020-10-09 DIAGNOSIS — R739 Hyperglycemia, unspecified: Secondary | ICD-10-CM | POA: Diagnosis present

## 2020-10-09 DIAGNOSIS — E876 Hypokalemia: Secondary | ICD-10-CM | POA: Diagnosis not present

## 2020-10-09 DIAGNOSIS — Z833 Family history of diabetes mellitus: Secondary | ICD-10-CM

## 2020-10-09 DIAGNOSIS — N289 Disorder of kidney and ureter, unspecified: Secondary | ICD-10-CM | POA: Diagnosis present

## 2020-10-09 DIAGNOSIS — N179 Acute kidney failure, unspecified: Secondary | ICD-10-CM | POA: Diagnosis not present

## 2020-10-09 LAB — COMPREHENSIVE METABOLIC PANEL
ALT: 73 U/L — ABNORMAL HIGH (ref 0–44)
AST: 299 U/L — ABNORMAL HIGH (ref 15–41)
Albumin: 3.6 g/dL (ref 3.5–5.0)
Alkaline Phosphatase: 216 U/L — ABNORMAL HIGH (ref 38–126)
Anion gap: 14 (ref 5–15)
BUN: 15 mg/dL (ref 6–20)
CO2: 22 mmol/L (ref 22–32)
Calcium: 8.4 mg/dL — ABNORMAL LOW (ref 8.9–10.3)
Chloride: 97 mmol/L — ABNORMAL LOW (ref 98–111)
Creatinine, Ser: 1.93 mg/dL — ABNORMAL HIGH (ref 0.44–1.00)
GFR, Estimated: 30 mL/min — ABNORMAL LOW (ref >60)
Glucose, Bld: 224 mg/dL — ABNORMAL HIGH (ref 70–99)
Potassium: 3.9 mmol/L (ref 3.5–5.1)
Sodium: 133 mmol/L — ABNORMAL LOW (ref 135–145)
Total Bilirubin: 1.7 mg/dL — ABNORMAL HIGH (ref 0.3–1.2)
Total Protein: 8 g/dL (ref 6.5–8.1)

## 2020-10-09 LAB — CBC
HCT: 32.5 % — ABNORMAL LOW (ref 36.0–46.0)
Hemoglobin: 10.7 g/dL — ABNORMAL LOW (ref 12.0–15.0)
MCH: 26.9 pg (ref 26.0–34.0)
MCHC: 32.9 g/dL (ref 30.0–36.0)
MCV: 81.7 fL (ref 80.0–100.0)
Platelets: 70 10*3/uL — ABNORMAL LOW (ref 150–400)
RBC: 3.98 MIL/uL (ref 3.87–5.11)
RDW: 18.5 % — ABNORMAL HIGH (ref 11.5–15.5)
WBC: 4.9 10*3/uL (ref 4.0–10.5)
nRBC: 0 % (ref 0.0–0.2)

## 2020-10-09 LAB — PHOSPHORUS: Phosphorus: 2.2 mg/dL — ABNORMAL LOW (ref 2.5–4.6)

## 2020-10-09 LAB — MAGNESIUM: Magnesium: 1.1 mg/dL — ABNORMAL LOW (ref 1.7–2.4)

## 2020-10-09 MED ORDER — LORAZEPAM 2 MG/ML IJ SOLN
1.0000 mg | INTRAMUSCULAR | Status: DC | PRN
  Administered 2020-10-09 – 2020-10-10 (×2): 2 mg via INTRAMUSCULAR
  Filled 2020-10-09 (×2): qty 1

## 2020-10-09 MED ORDER — ALUM & MAG HYDROXIDE-SIMETH 200-200-20 MG/5ML PO SUSP: 30.0000 mL | ORAL | Status: DC | PRN

## 2020-10-09 MED ORDER — MAGNESIUM HYDROXIDE 400 MG/5ML PO SUSP: 30.0000 mL | Freq: Every day | ORAL | Status: DC | PRN

## 2020-10-09 MED ORDER — LOSARTAN POTASSIUM 50 MG PO TABS
50.0000 mg | ORAL_TABLET | Freq: Every day | ORAL | Status: DC
  Filled 2020-10-09: qty 1

## 2020-10-09 MED ORDER — FOLIC ACID 1 MG PO TABS: 1.0000 mg | ORAL_TABLET | Freq: Every day | ORAL | Status: DC

## 2020-10-09 MED ORDER — THIAMINE HCL 100 MG/ML IJ SOLN: 100.0000 mg | Freq: Every day | INTRAMUSCULAR | Status: DC

## 2020-10-09 MED ORDER — LORAZEPAM 1 MG PO TABS
1.0000 mg | ORAL_TABLET | ORAL | Status: DC | PRN
  Filled 2020-10-09: qty 2

## 2020-10-09 MED ORDER — ADULT MULTIVITAMIN W/MINERALS CH
1.0000 | ORAL_TABLET | Freq: Every day | ORAL | Status: DC
  Administered 2020-10-09: 1 via ORAL
  Filled 2020-10-09: qty 1

## 2020-10-09 MED ORDER — CARVEDILOL 12.5 MG PO TABS
25.0000 mg | ORAL_TABLET | Freq: Two times a day (BID) | ORAL | Status: DC
  Filled 2020-10-09: qty 2

## 2020-10-09 MED ORDER — TRAZODONE HCL 100 MG PO TABS
100.0000 mg | ORAL_TABLET | Freq: Every evening | ORAL | Status: DC | PRN
  Filled 2020-10-09: qty 1

## 2020-10-09 MED ORDER — THIAMINE HCL 100 MG PO TABS
100.0000 mg | ORAL_TABLET | Freq: Every day | ORAL | Status: DC
  Administered 2020-10-09: 100 mg via ORAL
  Filled 2020-10-09: qty 1

## 2020-10-09 MED ORDER — ACETAMINOPHEN 325 MG PO TABS: 650.0000 mg | ORAL_TABLET | Freq: Four times a day (QID) | ORAL | Status: DC | PRN

## 2020-10-09 MED ORDER — VITAMIN B-1 100 MG PO TABS: 100.0000 mg | ORAL_TABLET | Freq: Every day | ORAL | Status: DC

## 2020-10-09 MED ORDER — FOLIC ACID 1 MG PO TABS
1.0000 mg | ORAL_TABLET | Freq: Every day | ORAL | Status: DC
  Administered 2020-10-09: 1 mg via ORAL
  Filled 2020-10-09: qty 1

## 2020-10-09 MED ORDER — LORAZEPAM 2 MG PO TABS
2.0000 mg | ORAL_TABLET | Freq: Once | ORAL | Status: AC
  Administered 2020-10-09: 2 mg via ORAL

## 2020-10-09 MED ORDER — LORAZEPAM 2 MG/ML IJ SOLN
1.0000 mg | INTRAMUSCULAR | Status: DC | PRN
  Filled 2020-10-09: qty 1

## 2020-10-09 MED ORDER — LORAZEPAM 2 MG/ML IJ SOLN: 2.0000 mg | Freq: Once | INTRAMUSCULAR | Status: AC

## 2020-10-09 MED ORDER — HYDROXYZINE HCL 50 MG PO TABS
50.0000 mg | ORAL_TABLET | Freq: Three times a day (TID) | ORAL | Status: DC | PRN
  Filled 2020-10-09: qty 1

## 2020-10-09 NOTE — ED Notes (Signed)
Patient has not slept all night, agitated, constantly comes out of room and goes out exit.

## 2020-10-09 NOTE — Progress Notes (Signed)
Patient admitted from [facility] ED for major depression.Patient is sad and depressed but cooperative during admission assessment.Patient stated that she want to stop her alcohol. Patient denies SI/HI at this time. Patient denies AVH. Patient informed of fall risk status, fall risk assessed "low" at this time. Patient oriented to unit/staff/room. Patient denies any questions/concerns at this time. Patient safe on unit with Q15 minute checks for safety.

## 2020-10-09 NOTE — [facility] Suicide Risk Assessment (Signed)
[facility] Admission Suicide Risk Assessment   Nursing information obtained from:    Demographic factors:    Current Mental Status:    Loss Factors:    Historical Factors:    Risk Reduction Factors:     Total Time spent with patient: 45 minutes Principal Problem: Severe recurrent major depression without psychotic features (HCC) Diagnosis:  Principal Problem:   Severe recurrent major depression without psychotic features (HCC) Active Problems:   Hyperglycemia   Hypertension   Suicidal ideation   Alcohol use disorder, severe, dependence (HCC)  Subjective Data: Allison [name] seen one-on-one in her bedroom. After lunch patient stood and began feeling light headed and confused. She was placed in wheel chair and escorted to room. Provider conducted a neuro physical exam. CN2-12 intact. Muscle strength 5/5 in bilateral upper and lower extremities. She was able to state her name, birth date, location, and conditions bringing her to hospital. She states she wants help staying sober from alcohol. Her blood pressure is elevated on exam, and she clinically appears to be going through acute alcohol withdrawals. Towards end of exam she states she needs to go to the bathroom. She is unsteady on her feet, and is assisted to bathroom. Afterwards she turns on shower stating she is trying to wash her hands. Finally, she attempts to get back into bed from the bottom of the bed over the railing. She requires this provider and two nurses to redirect her into getting to the side of the bed. She has been moved in front of the nursing station for safety. Sitter has been ordered for safety at this time, and provided Ativan for acute alcohol withdrawals. CIWA protocol in place.   Continued Clinical Symptoms:    The "Alcohol Use Disorders Identification Test", Guidelines for Use in Primary Care, Second Edition.  World [employer] writer [facility]). Score between 0-7:  no or low risk or alcohol related problems. Score between  8-15:  moderate risk of alcohol related problems. Score between 16-19:  high risk of alcohol related problems. Score 20 or above:  warrants further diagnostic evaluation for alcohol dependence and treatment.   CLINICAL FACTORS:   Severe Anxiety and/or Agitation Depression:   Comorbid alcohol abuse/dependence Hopelessness Impulsivity Alcohol/Substance Abuse/Dependencies More than one psychiatric diagnosis Unstable or Poor Therapeutic Relationship Previous Psychiatric Diagnoses and Treatments Medical Diagnoses and Treatments/Surgeries   Musculoskeletal: Strength & Muscle Tone: within normal limits Gait & Station: ataxic Patient leans: Right  Psychiatric Specialty Exam: Physical Exam Constitutional:      Appearance: Normal appearance. She is ill-appearing.  HENT:     Head: Normocephalic and atraumatic.     Right Ear: External ear normal.     Left Ear: External ear normal.     Nose: Nose normal.     Mouth/Throat:     Mouth: Mucous membranes are moist.     Pharynx: Oropharynx is clear.  Eyes:     Extraocular Movements: Extraocular movements intact.     Conjunctiva/sclera: Conjunctivae normal.     Pupils: Pupils are equal, round, and reactive to light.  Cardiovascular:     Rate and Rhythm: Normal rate.     Pulses: Normal pulses.  Pulmonary:     Effort: Pulmonary effort is normal.     Breath sounds: Normal breath sounds.  Abdominal:     General: Abdomen is flat.     Palpations: Abdomen is soft.  Musculoskeletal:        General: No swelling. Normal range of motion.  Cervical back: Normal range of motion and neck supple.  Skin:    General: Skin is warm and dry.  Neurological:     Mental Status: She is disoriented.     Cranial Nerves: Cranial nerves are intact.     Sensory: Sensation is intact.     Motor: Motor function is intact.     Coordination: Coordination abnormal.     Gait: Gait abnormal.  Psychiatric:        Mood and Affect: Mood is anxious.         Speech: Speech is slurred.        Behavior: Behavior is slowed.        Cognition and Memory: Cognition is impaired.        Judgment: Judgment is impulsive.     Review of Systems  Constitutional: Negative for appetite change and fatigue.  HENT: Negative for rhinorrhea and sore throat.   Eyes: Negative for photophobia and visual disturbance.  Respiratory: Negative for cough and shortness of breath.   Cardiovascular: Negative for chest pain and palpitations.  Gastrointestinal: Positive for diarrhea and nausea. Negative for vomiting.  Endocrine: Negative for cold intolerance and heat intolerance.  Genitourinary: Negative for difficulty urinating and dysuria.  Musculoskeletal: Positive for gait problem. Negative for back pain.  Skin: Negative for rash and wound.  Allergic/Immunologic: Negative for food allergies and immunocompromised state.  Neurological: Positive for dizziness. Negative for seizures.  Hematological: Negative for adenopathy. Does not bruise/bleed easily.  Psychiatric/Behavioral: Positive for behavioral problems. Negative for suicidal ideas. The patient is nervous/anxious.     Blood pressure (!) 150/102, pulse 84, temperature 98.4 F (36.9 C), temperature source Oral, resp. rate 16, height 5\' 2"  (1.575 m), weight 67.6 kg, SpO2 100 %.Body mass index is 27.25 kg/m.  General Appearance: Disheveled  Eye Contact:  Fair  Speech:  Garbled  Volume:  Normal  Mood:  Anxious  Affect:  Congruent  Thought Process:  Disorganized  Orientation:  Full (Time, Place, and Person)  Thought Content:  Illogical  Suicidal Thoughts:  No  Homicidal Thoughts:  No  Memory:  Immediate;   Poor Recent;   Poor Remote;   Poor  Judgement:  Impaired  Insight:  Fair  Psychomotor Activity:  Negative  Concentration:  Concentration: Poor and Attention Span: Poor  Recall:  Negative  Fund of Knowledge:  Poor  Language:  Poor  Akathisia:  Negative  Handed:  Right  AIMS (if indicated):     Assets:   Desire for Improvement Housing Social Support  ADL's:  Impaired  Cognition:  Impaired,  Severe  Sleep:         COGNITIVE FEATURES THAT CONTRIBUTE TO RISK:  Loss of executive function    SUICIDE RISK:   Moderate:  Frequent suicidal ideation with limited intensity, and duration, some specificity in terms of plans, no associated intent, good self-control, limited dysphoria/symptomatology, some risk factors present, and identifiable protective factors, including available and accessible social support.  PLAN OF CARE: Continue inpatient admission. Place on one-to-one precautions due to high fall risk and confusion. CIWA protocol in place.   I certify that inpatient services furnished can reasonably be expected to improve the patient's condition.   , MD 10/09/2020, 1:48 PM

## 2020-10-09 NOTE — ED Notes (Signed)
Patients belongs sent with patient

## 2020-10-09 NOTE — ED Notes (Signed)
Patient resting on bed/ calm / watching TV

## 2020-10-09 NOTE — H&P (Signed)
Psychiatric Admission Assessment Adult  Patient Identification: Allison [name] MRN:  [id number] Date of Evaluation:  10/09/2020 Chief Complaint:  Severe recurrent major depression without psychotic features (HCC) [F33.2] Principal Diagnosis: Severe recurrent major depression without psychotic features (HCC) Diagnosis:  Principal Problem:   Severe recurrent major depression without psychotic features (HCC) Active Problems:   Hyperglycemia   Hypertension   Suicidal ideation   Alcohol use disorder, severe, dependence (HCC)  History of Present Illness: Allison [name] presented to outside hospital under involuntary commitment for worsening depression, alcohol use, and threats to kill herself and her husband. On arrival, patient oriented and able to participate in assessment with nursing staff. After lunch patient stood and began feeling light headed and confused. She was placed in wheel chair and escorted to room. Provider conducted a neuro physical exam. CN2-12 intact. Muscle strength 5/5 in bilateral upper and lower extremities. She was able to state her name, birth date, location, and conditions bringing her to hospital. She states she wants help staying sober from alcohol. Her blood pressure is elevated on exam, and she clinically appears to be going through acute alcohol withdrawals. Towards end of exam she states she needs to go to the bathroom. She is unsteady on her feet, and is assisted to bathroom. Afterwards she turns on shower stating she is trying to wash her hands. Finally, she attempts to get back into bed from the bottom of the bed over the railing. She requires this provider and two nurses to redirect her into getting to the side of the bed. She has been moved in front of the nursing station for safety. Sitter has been ordered for safety at this time, and provided Ativan for acute alcohol withdrawals. CIWA protocol in place.   Per IVC, initiated by patient's cousin, : " [name] has  depression and high blood pressure and the family is not sure if she is taking her prescribed medications because she hides it. [name] is not bathing, eating, and is partially sleeping. [name] is upset her husband left her and says she is going to kill him and kill herself. [name] curses friends out, threatens to beat up people, drinks alcohol excessively, says she does not care about anything including her life. Walks out of the house at all times of night and says she is going to die."  Per outside hospital, "Patient reports her husband left her 3 weeks ago and took their 5 daughters with them. She reports she has been drinking excessively since then to cope. Patient also lost her job at [employer] 2 weeks ago as she could not function at work."  Associated Signs/Symptoms: Depression Symptoms:  depressed mood, insomnia, fatigue, feelings of worthlessness/guilt, hopelessness, Duration of Depression Symptoms: No data recorded (Hypo) Manic Symptoms:  Labiality of Mood, Anxiety Symptoms:  Excessive Worry, Panic Symptoms, Psychotic Symptoms:  None Duration of Psychotic Symptoms: No data recorded PTSD Symptoms: Negative Total Time spent with patient: 45 minutes  Past Psychiatric History: Per chart review suicide attempt by jumping in front of care in 2020, [facility] She [facility].   Is the patient at risk to self? Yes.    Has the patient been a risk to self in the past 6 months? Yes.    Has the patient been a risk to self within the distant past? Yes.    Is the patient a risk to others? Yes.    Has the patient been a risk to  others in the past 6 months? Yes.    Has the patient been a risk to others within the distant past? No.    Alcohol Screening:   Substance Abuse History in the last 12 months:  Yes.   Consequences of Substance Abuse: Family Consequences:  husband left her and took her 5  daughters Withdrawal Symptoms:   Diaphoresis Diarrhea Nausea Previous Psychotropic Medications: Yes  Psychological Evaluations: Yes  Past Medical History:  Past Medical History:  Diagnosis Date  . GERD (gastroesophageal reflux disease)   . Hypertension    No past surgical history on file. Family History:  Family History  Problem Relation Age of Onset  . Hypertension Mother   . Diabetes Mother   . Hypertension Father    Family Psychiatric  History: Denies Tobacco Screening:   Social History:  Social History   Substance and Sexual Activity  Alcohol Use Yes  . Alcohol/week: 14.0 standard drinks  . Types: 14 Standard drinks or equivalent per week   Comment: pint of liquor     Social History   Substance and Sexual Activity  Drug Use Yes  . Types: Marijuana    Additional Social History:    Allergies:   Allergies  Allergen Reactions  . Lisinopril Cough   Lab Results: No results found for this or any previous visit (from the past 48 hour(s)).  Blood Alcohol level:  Lab Results  Component Value Date   ETH [facility]) 10/06/2020   ETH <10 01/29/2020    Metabolic Disorder Labs:  Lab Results  Component Value Date   HGBA1C 4.4 (L) 06/11/2019   MPG 79.58 06/11/2019   MPG 189 05/22/2016   No results found for: PROLACTIN Lab Results  Component Value Date   CHOL 173 06/11/2019   TRIG 61 06/11/2019   HDL 41 06/11/2019   CHOLHDL 4.2 06/11/2019   VLDL 12 06/11/2019   LDLCALC NOT CALCULATED 06/11/2019    Current Medications: Current Facility-Administered Medications  Medication Dose Route Frequency Provider Last Rate Last Admin  . acetaminophen (TYLENOL) tablet 650 mg  650 mg Oral Q6H PRN Clapacs, John T, MD      . alum & mag hydroxide-simeth (MAALOX/MYLANTA) 200-200-20 MG/5ML suspension 30 mL  30 mL Oral Q4H PRN Clapacs, John T, MD      . carvedilol (COREG) tablet 25 mg  25 mg Oral BID WC Clapacs, John T, MD      . folic acid (FOLVITE) tablet 1 mg  1 mg Oral  Daily Clapacs, John T, MD   1 mg at 10/09/20 1346  . hydrOXYzine (ATARAX/VISTARIL) tablet 50 mg  50 mg Oral TID PRN Clapacs, John T, MD      . LORazepam (ATIVAN) tablet 1-4 mg  1-4 mg Oral Q1H PRN Clapacs, Jackquline Denmark, MD       Or  . LORazepam (ATIVAN) injection 1-4 mg  1-4 mg Intravenous Q1H PRN Clapacs, John T, MD      . losartan (COZAAR) tablet 50 mg  50 mg Oral Daily Clapacs, John T, MD      . magnesium hydroxide (MILK OF MAGNESIA) suspension 30 mL  30 mL Oral Daily PRN Clapacs, John T, MD      . multivitamin with minerals tablet 1 tablet  1 tablet Oral Daily Clapacs, Jackquline Denmark, MD   1 tablet at 10/09/20 1346  . thiamine tablet 100 mg  100 mg Oral Daily Clapacs, Jackquline Denmark, MD   100 mg at 10/09/20 1346   Or  .  thiamine (B-1) injection 100 mg  100 mg Intravenous Daily Clapacs, John T, MD      . traZODone (DESYREL) tablet 100 mg  100 mg Oral QHS PRN Clapacs, Jackquline Denmark, MD       PTA Medications: Medications Prior to Admission  Medication Sig Dispense Refill Last Dose  . carvedilol (COREG) 25 MG tablet Take 1 tablet (25 mg total) by mouth 2 (two) times daily with a meal. (Patient not taking: Reported on 06/07/2020) 60 tablet 0   . chlordiazePOXIDE (LIBRIUM) 25 MG capsule 50mg  PO TID x 1D, then 25-50mg  PO BID X 1D, then 25-50mg  PO QD X 1D (Patient not taking: Reported on 10/07/2020) 10 capsule 0   . ferrous sulfate 325 (65 FE) MG tablet Take 1 tablet (325 mg total) by mouth every Monday, Wednesday, and Friday. (Patient not taking: Reported on 06/07/2020) 30 tablet 0   . folic acid (FOLVITE) 1 MG tablet Take 1 tablet (1 mg total) by mouth daily. (Patient not taking: Reported on 01/29/2020) 30 tablet 0   . losartan (COZAAR) 50 MG tablet Take 1 tablet (50 mg total) by mouth daily. (Patient not taking: Reported on 06/07/2020) 30 tablet 0   . thiamine (VITAMIN B-1) 100 MG tablet Take 1 tablet (100 mg total) by mouth daily. (Patient not taking: Reported on 06/07/2020) 30 tablet 0     Musculoskeletal: Strength & Muscle  Tone: within normal limits Gait & Station: ataxic Patient leans: Right  Psychiatric Specialty Exam: Physical Exam Constitutional:      Appearance: Normal appearance. She is ill-appearing.  HENT:     Head: Normocephalic and atraumatic.     Right Ear: External ear normal.     Left Ear: External ear normal.     Nose: Nose normal.     Mouth/Throat:     Mouth: Mucous membranes are moist.     Pharynx: Oropharynx is clear.  Eyes:     Extraocular Movements: Extraocular movements intact.     Conjunctiva/sclera: Conjunctivae normal.     Pupils: Pupils are equal, round, and reactive to light.  Cardiovascular:     Rate and Rhythm: Normal rate.     Pulses: Normal pulses.  Pulmonary:     Effort: Pulmonary effort is normal.     Breath sounds: Normal breath sounds.  Abdominal:     General: Abdomen is flat.     Palpations: Abdomen is soft.  Musculoskeletal:        General: No swelling. Normal range of motion.     Cervical back: Normal range of motion and neck supple.  Skin:    General: Skin is warm and dry.  Neurological:     Mental Status: She is disoriented.     Cranial Nerves: Cranial nerves are intact.     Sensory: Sensation is intact.     Motor: Motor function is intact.     Coordination: Coordination abnormal.     Gait: Gait abnormal.  Psychiatric:        Mood and Affect: Mood is anxious.        Speech: Speech is slurred.        Behavior: Behavior is slowed.        Cognition and Memory: Cognition is impaired.        Judgment: Judgment is impulsive.     Review of Systems  Constitutional: Negative for appetite change and fatigue.  HENT: Negative for rhinorrhea and sore throat.   Eyes: Negative for photophobia and visual disturbance.  Respiratory:  Negative for cough and shortness of breath.   Cardiovascular: Negative for chest pain and palpitations.  Gastrointestinal: Positive for diarrhea and nausea. Negative for vomiting.  Endocrine: Negative for cold intolerance and  heat intolerance.  Genitourinary: Negative for difficulty urinating and dysuria.  Musculoskeletal: Positive for gait problem. Negative for back pain.  Skin: Negative for rash and wound.  Allergic/Immunologic: Negative for food allergies and immunocompromised state.  Neurological: Positive for dizziness. Negative for seizures.  Hematological: Negative for adenopathy. Does not bruise/bleed easily.  Psychiatric/Behavioral: Positive for behavioral problems. Negative for suicidal ideas. The patient is nervous/anxious.     Blood pressure (!) 150/102, pulse 84, temperature 98.4 F (36.9 C), temperature source Oral, resp. rate 16, height 5\' 2"  (1.575 m), weight 67.6 kg, SpO2 100 %.Body mass index is 27.25 kg/m.  General Appearance: Disheveled  Eye Contact:  Fair  Speech:  Garbled  Volume:  Normal  Mood:  Anxious  Affect:  Congruent  Thought Process:  Disorganized  Orientation:  Full (Time, Place, and Person)  Thought Content:  Illogical  Suicidal Thoughts:  No  Homicidal Thoughts:  No  Memory:  Immediate;   Poor Recent;   Poor Remote;   Poor  Judgement:  Impaired  Insight:  Fair  Psychomotor Activity:  Negative  Concentration:  Concentration: Poor and Attention Span: Poor  Recall:  Negative  Fund of Knowledge:  Poor  Language:  Poor  Akathisia:  Negative  Handed:  Right  AIMS (if indicated):     Assets:  Desire for Improvement Housing Social Support  ADL's:  Impaired  Cognition:  Impaired,  Severe  Sleep:          Treatment Plan Summary: Daily contact with patient to assess and evaluate symptoms and progress in treatment, Medication management and Plan Place on one-to-one precautions due to high fall risk and confusion. CIWA protocol in place.  Observation Level/Precautions:  1 to 1  Laboratory:  CBC, CMP, Mg, Phos  Psychotherapy:  As tolerated  Medications:  As above  Consultations:    Discharge Concerns:    Estimated LOS: 5-7 days  Other:     Physician Treatment  Plan for Primary Diagnosis: Severe recurrent major depression without psychotic features (HCC) Long Term Goal(s): Improvement in symptoms so as ready for discharge  Short Term Goals: Ability to identify changes in lifestyle to reduce recurrence of condition will improve, Ability to verbalize feelings will improve, Ability to disclose and discuss suicidal ideas, Ability to demonstrate self-control will improve, Ability to identify and develop effective coping behaviors will improve, Compliance with prescribed medications will improve and Ability to identify triggers associated with substance abuse/mental health issues will improve  Physician Treatment Plan for Secondary Diagnosis: Principal Problem:   Severe recurrent major depression without psychotic features (HCC) Active Problems:   Hyperglycemia   Hypertension   Suicidal ideation   Alcohol use disorder, severe, dependence (HCC)  Long Term Goal(s): Improvement in symptoms so as ready for discharge  Short Term Goals: Ability to identify changes in lifestyle to reduce recurrence of condition will improve, Ability to verbalize feelings will improve, Ability to disclose and discuss suicidal ideas, Ability to demonstrate self-control will improve, Ability to identify and develop effective coping behaviors will improve and Ability to identify triggers associated with substance abuse/mental health issues will improve  I certify that inpatient services furnished can reasonably be expected to improve the patient's condition.    , MD 10/15/20212:01 PM

## 2020-10-09 NOTE — Progress Notes (Addendum)
Patient walking in the hall way trying to open the doors.

## 2020-10-09 NOTE — ED Notes (Signed)
Patient ran out of TCU exit, ER staff escorted patient to room. Patient yelling in room.

## 2020-10-09 NOTE — Progress Notes (Signed)
Alerted by staff that Allison [name] was confused, picking at the air as if seeing objects, wandering the unit attempting to leave. She was unable to be redirected, and appeared to be visibly going through alcohol withdrawals. She refused oral Ativan. She required a physical hold and chair restraint to administer IM lorazepam. She was initially combative trying to bite staff, but quickly calmed and was released from restraints at 5:10pm.  Will continue to monitor patient closely via CIWA protocol.

## 2020-10-09 NOTE — Progress Notes (Signed)
Patient tried to exit the unit, pushing and kicking various doors including windows in the day room. Patient refused to take medication so a Lawerance Cruel hold was utilized to maintain safety of the patient and staff as patient made threats of aggression. After given prn, patient continued to attempt to bite staff and refuse to walk back to her room. Patient was in [place] hold from 16:45-17:00. In order to safely escort the patient back to her room, patient was placed in a chair restraint and transported back to her room. Patient was in restraint chair from 17:00-17:05.

## 2020-10-10 ENCOUNTER — Inpatient Hospital Stay
Admission: AD | Admit: 2020-10-10 | Discharge: 2020-10-13 | DRG: 897 | Disposition: A | Discharge status: Home / Self Care | Payer: 59 | Source: Ambulatory Visit | Attending: Obstetrics and Gynecology | Admitting: Obstetrics and Gynecology

## 2020-10-10 DIAGNOSIS — K219 Gastro-esophageal reflux disease without esophagitis: Secondary | ICD-10-CM | POA: Diagnosis present

## 2020-10-10 DIAGNOSIS — K709 Alcoholic liver disease, unspecified: Secondary | ICD-10-CM

## 2020-10-10 DIAGNOSIS — R011 Cardiac murmur, unspecified: Secondary | ICD-10-CM | POA: Diagnosis present

## 2020-10-10 DIAGNOSIS — R45851 Suicidal ideations: Secondary | ICD-10-CM | POA: Diagnosis present

## 2020-10-10 DIAGNOSIS — F333 Major depressive disorder, recurrent, severe with psychotic symptoms: Secondary | ICD-10-CM | POA: Diagnosis present

## 2020-10-10 DIAGNOSIS — K760 Fatty (change of) liver, not elsewhere classified: Secondary | ICD-10-CM | POA: Diagnosis present

## 2020-10-10 DIAGNOSIS — Z560 Unemployment, unspecified: Secondary | ICD-10-CM

## 2020-10-10 DIAGNOSIS — F10231 Alcohol dependence with withdrawal delirium: Secondary | ICD-10-CM | POA: Diagnosis present

## 2020-10-10 DIAGNOSIS — F10931 Alcohol use, unspecified with withdrawal delirium: Secondary | ICD-10-CM

## 2020-10-10 DIAGNOSIS — F1020 Alcohol dependence, uncomplicated: Secondary | ICD-10-CM

## 2020-10-10 DIAGNOSIS — E876 Hypokalemia: Secondary | ICD-10-CM | POA: Diagnosis present

## 2020-10-10 DIAGNOSIS — D638 Anemia in other chronic diseases classified elsewhere: Secondary | ICD-10-CM | POA: Diagnosis present

## 2020-10-10 DIAGNOSIS — N179 Acute kidney failure, unspecified: Secondary | ICD-10-CM | POA: Diagnosis present

## 2020-10-10 DIAGNOSIS — R45850 Homicidal ideations: Secondary | ICD-10-CM | POA: Diagnosis present

## 2020-10-10 DIAGNOSIS — Z833 Family history of diabetes mellitus: Secondary | ICD-10-CM | POA: Diagnosis not present

## 2020-10-10 DIAGNOSIS — D6959 Other secondary thrombocytopenia: Secondary | ICD-10-CM | POA: Diagnosis present

## 2020-10-10 DIAGNOSIS — E860 Dehydration: Secondary | ICD-10-CM | POA: Diagnosis present

## 2020-10-10 DIAGNOSIS — F332 Major depressive disorder, recurrent severe without psychotic features: Secondary | ICD-10-CM | POA: Diagnosis present

## 2020-10-10 DIAGNOSIS — D696 Thrombocytopenia, unspecified: Secondary | ICD-10-CM | POA: Diagnosis present

## 2020-10-10 DIAGNOSIS — Z8249 Family history of ischemic heart disease and other diseases of the circulatory system: Secondary | ICD-10-CM

## 2020-10-10 DIAGNOSIS — Z79899 Other long term (current) drug therapy: Secondary | ICD-10-CM

## 2020-10-10 DIAGNOSIS — I10 Essential (primary) hypertension: Secondary | ICD-10-CM | POA: Diagnosis present

## 2020-10-10 DIAGNOSIS — M79669 Pain in unspecified lower leg: Secondary | ICD-10-CM

## 2020-10-10 LAB — COMPREHENSIVE METABOLIC PANEL
ALT: 87 U/L — ABNORMAL HIGH (ref 0–44)
AST: 291 U/L — ABNORMAL HIGH (ref 15–41)
Albumin: 4.6 g/dL (ref 3.5–5.0)
Alkaline Phosphatase: 231 U/L — ABNORMAL HIGH (ref 38–126)
Anion gap: 14 (ref 5–15)
BUN: 36 mg/dL — ABNORMAL HIGH (ref 6–20)
CO2: 26 mmol/L (ref 22–32)
Calcium: 9.9 mg/dL (ref 8.9–10.3)
Chloride: 96 mmol/L — ABNORMAL LOW (ref 98–111)
Creatinine, Ser: 2.75 mg/dL — ABNORMAL HIGH (ref 0.44–1.00)
GFR, Estimated: 20 mL/min — ABNORMAL LOW (ref >60)
Glucose, Bld: 131 mg/dL — ABNORMAL HIGH (ref 70–99)
Potassium: 3.1 mmol/L — ABNORMAL LOW (ref 3.5–5.1)
Sodium: 136 mmol/L (ref 135–145)
Total Bilirubin: 1.7 mg/dL — ABNORMAL HIGH (ref 0.3–1.2)
Total Protein: 9.2 g/dL — ABNORMAL HIGH (ref 6.5–8.1)

## 2020-10-10 LAB — MAGNESIUM: Magnesium: 1.3 mg/dL — ABNORMAL LOW (ref 1.7–2.4)

## 2020-10-10 LAB — MRSA PCR SCREENING: MRSA by PCR: NEGATIVE

## 2020-10-10 LAB — PHOSPHORUS: Phosphorus: 1.5 mg/dL — ABNORMAL LOW (ref 2.5–4.6)

## 2020-10-10 MED ORDER — ONDANSETRON HCL 4 MG/2ML IJ SOLN: 4.0000 mg | Freq: Four times a day (QID) | INTRAMUSCULAR | Status: DC | PRN

## 2020-10-10 MED ORDER — LORAZEPAM 1 MG PO TABS
1.0000 mg | ORAL_TABLET | ORAL | Status: DC | PRN
  Administered 2020-10-11: 1 mg via ORAL
  Filled 2020-10-10: qty 1

## 2020-10-10 MED ORDER — CARVEDILOL 25 MG PO TABS
25.0000 mg | ORAL_TABLET | Freq: Two times a day (BID) | ORAL | Status: DC
  Administered 2020-10-12 – 2020-10-13 (×3): 25 mg via ORAL
  Filled 2020-10-10 (×3): qty 1

## 2020-10-10 MED ORDER — POTASSIUM CHLORIDE 10 MEQ/100ML IV SOLN
10.0000 meq | INTRAVENOUS | Status: AC
  Administered 2020-10-10 (×4): 10 meq via INTRAVENOUS
  Filled 2020-10-10 (×4): qty 100

## 2020-10-10 MED ORDER — CHLORHEXIDINE GLUCONATE CLOTH 2 % EX PADS
6.0000 | MEDICATED_PAD | Freq: Every day | CUTANEOUS | Status: DC
  Administered 2020-10-10 – 2020-10-13 (×2): 6 via TOPICAL

## 2020-10-10 MED ORDER — THIAMINE HCL 100 MG/ML IJ SOLN
Freq: Once | INTRAVENOUS | Status: AC
  Filled 2020-10-10: qty 1000

## 2020-10-10 MED ORDER — CLONIDINE HCL 0.1 MG PO TABS
0.1000 mg | ORAL_TABLET | Freq: Three times a day (TID) | ORAL | Status: DC
  Administered 2020-10-10: 0.1 mg via ORAL
  Filled 2020-10-10: qty 1

## 2020-10-10 MED ORDER — ENOXAPARIN SODIUM 30 MG/0.3ML ~~LOC~~ SOLN: 30.0000 mg | SUBCUTANEOUS | Status: DC

## 2020-10-10 MED ORDER — SODIUM CHLORIDE 0.9% FLUSH
3.0000 mL | Freq: Two times a day (BID) | INTRAVENOUS | Status: DC
  Administered 2020-10-10 – 2020-10-11 (×2): 3 mL via INTRAVENOUS

## 2020-10-10 MED ORDER — LORAZEPAM 2 MG/ML IJ SOLN: 1.0000 mg | INTRAMUSCULAR | Status: DC | PRN

## 2020-10-10 MED ORDER — ONDANSETRON HCL 4 MG PO TABS: 4.0000 mg | ORAL_TABLET | Freq: Four times a day (QID) | ORAL | Status: DC | PRN

## 2020-10-10 MED ORDER — HYDRALAZINE HCL 20 MG/ML IJ SOLN
10.0000 mg | Freq: Four times a day (QID) | INTRAMUSCULAR | Status: DC | PRN
  Administered 2020-10-12: 05:00:00 10 mg via INTRAVENOUS
  Filled 2020-10-10: qty 1

## 2020-10-10 MED ORDER — LORAZEPAM 2 MG/ML IJ SOLN
2.0000 mg | Freq: Once | INTRAMUSCULAR | Status: AC
  Administered 2020-10-10: 2 mg via INTRAMUSCULAR
  Filled 2020-10-10: qty 1

## 2020-10-10 MED ORDER — LISINOPRIL 20 MG PO TABS: 20.0000 mg | ORAL_TABLET | Freq: Every day | ORAL | Status: DC

## 2020-10-10 NOTE — Progress Notes (Signed)
Patient to discharge to ICU per Dr Toni Amend. Nursing report given to [facility] at ICU.

## 2020-10-10 NOTE — Progress Notes (Signed)
[facility]  10/10/2020 1:31 PM Allison [name]  MRN:  [id number] Subjective: Follow-up for this 49 year old woman with depression but also with alcohol abuse.  Patient seen chart reviewed.  Patient was agitated today.  She had moved out of the room to which she was assigned and moved furniture around and was in a different room.  Patient tells me that she is going to a birthday party for her aunt at her brother's house which she indicates by [place].  She tells me she is getting ready to put her earrings on and then starts to try to crawl out the window.  Last blood pressure was still extremely elevated despite 2 blood pressure medicines.  Patient was not threatening or hostile but is extremely confused and almost certainly having delirium tremens.  Last set of labs done yesterday afternoon showed a worsening of most chemistries since admission including a worsening of liver functions and bilirubin and worsening of kidney function.  She has not been eating or drinking regularly since then. Principal Problem: Severe recurrent major depression without psychotic features (HCC) Diagnosis: Principal Problem:   Severe recurrent major depression without psychotic features (HCC) Active Problems:   Hyperglycemia   Hypertension   Suicidal ideation   Alcohol use disorder, severe, dependence (HCC)  Total Time spent with patient: 30 minutes  Past Psychiatric History: Patient has a history of recurrent episodes of delirium with renal compromise related to dehydration during DTs.  History of depression mostly related to alcohol abuse  Past Medical History:  Past Medical History:  Diagnosis Date  . GERD (gastroesophageal reflux disease)   . Hypertension    History reviewed. No pertinent surgical history. Family History:  Family History  Problem Relation Age of Onset  . Hypertension Mother   . Diabetes Mother   . Hypertension Father    Family Psychiatric   History: See previous Social History:  Social History   Substance and Sexual Activity  Alcohol Use Yes  . Alcohol/week: 14.0 standard drinks  . Types: 14 Standard drinks or equivalent per week   Comment: pint of liquor     Social History   Substance and Sexual Activity  Drug Use Yes  . Types: Marijuana    Social History   Socioeconomic History  . Marital status: Married    Spouse name: Not on file  . Number of children: Not on file  . Years of education: Not on file  . Highest education level: Not on file  Occupational History  . Not on file  Tobacco Use  . Smoking status: Never Smoker  . Smokeless tobacco: Never Used  Substance and Sexual Activity  . Alcohol use: Yes    Alcohol/week: 14.0 standard drinks    Types: 14 Standard drinks or equivalent per week    Comment: pint of liquor  . Drug use: Yes    Types: Marijuana  . Sexual activity: Yes    Birth control/protection: Implant  Other Topics Concern  . Not on file  Social History Narrative  . Not on file   Social Determinants of Health   Financial Resource Strain:   . Difficulty of Paying Living Expenses: Not on file  Food Insecurity:   . Worried About Programme researcher, broadcasting/film/videounning Out of Food in the Last Year: Not on file  . Ran Out of Food in the Last Year: Not on file  Transportation Needs:   . Lack of Transportation (Medical): Not on file  . Lack  of Transportation (Non-Medical): Not on file  Physical Activity:   . Days of Exercise per Week: Not on file  . Minutes of Exercise per Session: Not on file  Stress:   . Feeling of Stress : Not on file  Social Connections:   . Frequency of Communication with Friends and Family: Not on file  . Frequency of Social Gatherings with Friends and Family: Not on file  . Attends Religious Services: Not on file  . Active Member of Clubs or Organizations: Not on file  . Attends Banker Meetings: Not on file  . Marital Status: Not on file   Additional Social History:                          Sleep: Poor  Appetite:  Poor  Current Medications: Current Facility-Administered Medications  Medication Dose Route Frequency Provider Last Rate Last Admin  . acetaminophen (TYLENOL) tablet 650 mg  650 mg Oral Q6H PRN Gianna Calef T, MD      . alum & mag hydroxide-simeth (MAALOX/MYLANTA) 200-200-20 MG/5ML suspension 30 mL  30 mL Oral Q4H PRN Katalea Ucci T, MD      . carvedilol (COREG) tablet 25 mg  25 mg Oral BID WC Anelia Carriveau T, MD      . cloNIDine (CATAPRES) tablet 0.1 mg  0.1 mg Oral TID Wells Mabe T, MD      . folic acid (FOLVITE) tablet 1 mg  1 mg Oral Daily Oletha Tolson T, MD   1 mg at 10/09/20 1346  . hydrOXYzine (ATARAX/VISTARIL) tablet 50 mg  50 mg Oral TID PRN Johnthan Axtman T, MD      . LORazepam (ATIVAN) injection 1-4 mg  1-4 mg Intramuscular Q1H PRN Jesse Sans, MD   2 mg at 10/10/20 2947   Or  . LORazepam (ATIVAN) tablet 1-4 mg  1-4 mg Oral Q1H PRN Jesse Sans, MD      . LORazepam (ATIVAN) injection 2 mg  2 mg Intramuscular Once Kimberlyn Quiocho T, MD      . losartan (COZAAR) tablet 50 mg  50 mg Oral Daily Dezyre Hoefer T, MD      . magnesium hydroxide (MILK OF MAGNESIA) suspension 30 mL  30 mL Oral Daily PRN Syed Zukas T, MD      . multivitamin with minerals tablet 1 tablet  1 tablet Oral Daily Kaylum Shrum, Jackquline Denmark, MD   1 tablet at 10/09/20 1346  . thiamine tablet 100 mg  100 mg Oral Daily Arryana Tolleson, Jackquline Denmark, MD   100 mg at 10/09/20 1346   Or  . thiamine (B-1) injection 100 mg  100 mg Intravenous Daily Canna Nickelson, Jackquline Denmark, MD      . traZODone (DESYREL) tablet 100 mg  100 mg Oral QHS PRN Fatmata Legere, Jackquline Denmark, MD        Lab Results:  Results for orders placed or performed during the hospital encounter of 10/09/20 (from the past 48 hour(s))  CBC     Status: Abnormal   Collection Time: 10/09/20  2:19 PM  Result Value Ref Range   WBC 4.9 4.0 - 10.5 K/uL   RBC 3.98 3.87 - 5.11 MIL/uL   Hemoglobin 10.7 (L) 12.0 - 15.0 g/dL   HCT 65.4 (L) 36 -  46 %   MCV 81.7 80.0 - 100.0 fL   MCH 26.9 26.0 - 34.0 pg   MCHC 32.9 30.0 - 36.0 g/dL   RDW  18.5 (H) 11.5 - 15.5 %   Platelets 70 (L) 150 - 400 K/uL    Comment: Immature Platelet Fraction may be clinically indicated, consider ordering this additional test [id number]    nRBC 0.0 0.0 - 0.2 %    Comment: Performed at [facility], [address] [place], [place] [address]  Comprehensive metabolic panel     Status: Abnormal   Collection Time: 10/09/20  2:19 PM  Result Value Ref Range   Sodium 133 (L) 135 - 145 mmol/L   Potassium 3.9 3.5 - 5.1 mmol/L    Comment: HEMOLYSIS AT THIS LEVEL MAY AFFECT RESULT   Chloride 97 (L) 98 - 111 mmol/L   CO2 22 22 - 32 mmol/L   Glucose, Bld 224 (H) 70 - 99 mg/dL    Comment: Glucose reference range applies only to samples taken after fasting for at least 8 hours.   BUN 15 6 - 20 mg/dL   Creatinine, Ser 0.16 (H) 0.44 - 1.00 mg/dL   Calcium 8.4 (L) 8.9 - 10.3 mg/dL   Total Protein 8.0 6.5 - 8.1 g/dL   Albumin 3.6 3.5 - 5.0 g/dL   AST 010 (H) 15 - 41 U/L    Comment: HEMOLYSIS AT THIS LEVEL MAY AFFECT RESULT   ALT 73 (H) 0 - 44 U/L   Alkaline Phosphatase 216 (H) 38 - 126 U/L   Total Bilirubin 1.7 (H) 0.3 - 1.2 mg/dL    Comment: HEMOLYSIS AT THIS LEVEL MAY AFFECT RESULT   GFR, Estimated 30 (L) >60 mL/min   Anion gap 14 5 - 15    Comment: Performed at [facility], [address] [place], [place] [address]  Magnesium     Status: Abnormal   Collection Time: 10/09/20  2:19 PM  Result Value Ref Range   Magnesium 1.1 (L) 1.7 - 2.4 mg/dL    Comment: Performed at [facility], [address] [place], [place] [address]  Phosphorus     Status: Abnormal   Collection Time: 10/09/20  2:19 PM  Result Value Ref Range   Phosphorus 2.2 (L) 2.5 - 4.6 mg/dL    Comment: Performed at [facility], [address] [place], [place] [address]    Blood Alcohol level:  Lab Results  Component Value Date   ETH 412 [facility])  10/06/2020   ETH <10 01/29/2020    Metabolic Disorder Labs: Lab Results  Component Value Date   HGBA1C 4.4 (L) 06/11/2019   MPG 79.58 06/11/2019   MPG 189 05/22/2016   No results found for: PROLACTIN Lab Results  Component Value Date   CHOL 173 06/11/2019   TRIG 61 06/11/2019   HDL 41 06/11/2019   CHOLHDL 4.2 06/11/2019   VLDL 12 06/11/2019   LDLCALC NOT CALCULATED 06/11/2019    Physical Findings: AIMS:  , ,  ,  ,    CIWA:  CIWA-Ar Total: 10 COWS:     Musculoskeletal: Strength & Muscle Tone: within normal limits Gait & Station: unsteady Patient leans: N/A  Psychiatric Specialty Exam: Physical Exam Vitals and nursing note reviewed.  Constitutional:      Appearance: She is well-developed.  HENT:     Head: Normocephalic and atraumatic.  Eyes:     Conjunctiva/sclera: Conjunctivae normal.     Pupils: Pupils are equal, round, and reactive to light.  Cardiovascular:     Heart sounds: Normal heart sounds.  Pulmonary:     Effort: Pulmonary effort is normal.  Abdominal:  Palpations: Abdomen is soft.  Musculoskeletal:        General: Normal range of motion.     Cervical back: Normal range of motion.  Skin:    General: Skin is warm and dry.  Neurological:     Mental Status: She is alert.  Psychiatric:        Attention and Perception: She is inattentive.        Mood and Affect: Affect is inappropriate.        Speech: Speech is tangential.        Behavior: Behavior is agitated.        Thought Content: Thought content is delusional.        Cognition and Memory: Cognition is impaired. Memory is impaired.        Judgment: Judgment is impulsive and inappropriate.     Review of Systems  Constitutional: Negative.   HENT: Negative.   Eyes: Negative.   Respiratory: Negative.   Cardiovascular: Negative.   Gastrointestinal: Negative.   Musculoskeletal: Negative.   Skin: Negative.   Neurological: Negative.   Psychiatric/Behavioral: Positive for agitation,  behavioral problems, confusion and decreased concentration. The patient is nervous/anxious and is hyperactive.     Blood pressure (!) 150/102, pulse 84, temperature 98.4 F (36.9 C), temperature source Oral, resp. rate 20, height 5\' 2"  (1.575 m), weight 67.6 kg, SpO2 100 %.Body mass index is 27.25 kg/m.  General Appearance: Disheveled  Eye Contact:  Minimal  Speech:  Garbled  Volume:  Decreased  Mood:  Euthymic  Affect:  Inappropriate  Thought Process:  Disorganized  Orientation:  Negative  Thought Content:  Illogical, Delusions and Tangential  Suicidal Thoughts:  No  Homicidal Thoughts:  No  Memory:  Negative  Judgement:  Negative  Insight:  Negative  Psychomotor Activity:  Restlessness  Concentration:  Concentration: Negative  Recall:  Negative  Fund of Knowledge:  Negative  Language:  Negative  Akathisia:  Negative  Handed:  Right  AIMS (if indicated):     Assets:  Social Support  ADL's:  Impaired  Cognition:  Impaired,  Moderate  Sleep:  Number of Hours: 0     Treatment Plan Summary: Daily contact with patient to assess and evaluate symptoms and progress in treatment, Medication management and Plan Patient's condition most consistent with delirium tremens.  Patient being in DTs is at high risk for accidental self injury for falls.  Needs more aggressive management of blood pressure and delirium symptoms.  I have reordered a comprehensive metabolic panel and awaiting the results of that.  I have spoken with nursing.  I believe the patient needs to be moved off of the locked back hallway and have a one-to-one sitter.  I have contacted the chief of nursing to try to facilitate this.  Depending on the results of chemistries we may consider possible referral to medicine.  Final plan for the day still pending.  , MD 10/10/2020, 1:31 PM

## 2020-10-10 NOTE — Progress Notes (Signed)
Patient has been actively hallucinating. Seeing the body of her husband and daughter on the floor. Is pulling on doors trying to get off of the unit. Taking off her clothes. CIWA 30 Received Ativan 2 mg IM. Patient somewhat calmer, but still awake and restless. Hallucinating.

## 2020-10-10 NOTE — Progress Notes (Signed)
Patient continuously hallucinating. Acting as if she is pulling things out of the air. Picking at the door. NP made aware.

## 2020-10-10 NOTE — Discharge Summary (Signed)
Physician Discharge Summary Note  Patient:  Allison [name] is an 49 y.o., female MRN:  [id number] DOB:  [birth year] Patient phone:  [phone] (home)  Patient address:   [address] [place] [address],  Total Time spent with patient: 30 minutes  Date of Admission:  10/09/2020 Date of Discharge: 10/10/2020  Reason for Admission: Patient was admitted in transfer from [facility] for complaints of depressive symptoms along with acute alcohol abuse and alcohol withdrawal symptoms  Principal Problem: Severe recurrent major depression without psychotic features [facility]) Discharge Diagnoses: Principal Problem:   Severe recurrent major depression without psychotic features (HCC) Active Problems:   Hyperglycemia   Hypertension   Suicidal ideation   Alcohol use disorder, severe, dependence (HCC)   Past Psychiatric History: Patient has a history of alcohol abuse with DTs renal insufficiency and major behavior problems in the past also history of depressive symptoms  Past Medical History:  Past Medical History:  Diagnosis Date  . GERD (gastroesophageal reflux disease)   . Hypertension    History reviewed. No pertinent surgical history. Family History:  Family History  Problem Relation Age of Onset  . Hypertension Mother   . Diabetes Mother   . Hypertension Father    Family Psychiatric  History: See previous Social History:  Social History   Substance and Sexual Activity  Alcohol Use Yes  . Alcohol/week: 14.0 standard drinks  . Types: 14 Standard drinks or equivalent per week   Comment: pint of liquor     Social History   Substance and Sexual Activity  Drug Use Yes  . Types: Marijuana    Social History   Socioeconomic History  . Marital status: Married    Spouse name: Not on file  . Number of children: Not on file  . Years of education: Not on file  . Highest education level: Not on file  Occupational History  . Not on file  Tobacco Use  . Smoking  status: Never Smoker  . Smokeless tobacco: Never Used  Substance and Sexual Activity  . Alcohol use: Yes    Alcohol/week: 14.0 standard drinks    Types: 14 Standard drinks or equivalent per week    Comment: pint of liquor  . Drug use: Yes    Types: Marijuana  . Sexual activity: Yes    Birth control/protection: Implant  Other Topics Concern  . Not on file  Social History Narrative  . Not on file   Social Determinants of Health   Financial Resource Strain:   . Difficulty of Paying Living Expenses: Not on file  Food Insecurity:   . Worried About Programme researcher, broadcasting/film/video in the Last Year: Not on file  . Ran Out of Food in the Last Year: Not on file  Transportation Needs:   . Lack of Transportation (Medical): Not on file  . Lack of Transportation (Non-Medical): Not on file  Physical Activity:   . Days of Exercise per Week: Not on file  . Minutes of Exercise per Session: Not on file  Stress:   . Feeling of Stress : Not on file  Social Connections:   . Frequency of Communication with Friends and Family: Not on file  . Frequency of Social Gatherings with Friends and Family: Not on file  . Attends Religious Services: Not on file  . Active Member of Clubs or Organizations: Not on file  . Attends Banker Meetings: Not on file  . Marital Status: Not on file    Hospital Course:  Patient admitted to psychiatric unit.  Labs reviewed.  Medications reviewed.  Patient was running high blood pressure which has continued and in fact escalated despite administration of usual outpatient medication.  Mental status has declined since yesterday with increasing degrees of confusion and agitation.  Today the patient is completely delirious and disoriented.  Pacing back and forth between different rooms.  Telling me she is going to a birthday party.  Telling me she is at her home and pointing out the window telling me it is her brother's house.  Trying to climb out windows at 1 point.  Fortunately  not physically aggressive and she was cooperative with laboratory studies and attempts to get vital signs.  I rechecked her labs today and her creatinine is up to 2.75 related to her continued lack of oral intake.  Physical Findings: AIMS:  , ,  ,  ,    CIWA:  CIWA-Ar Total: 10 COWS:     Musculoskeletal: Strength & Muscle Tone: within normal limits Gait & Station: unsteady Patient leans: N/A  Psychiatric Specialty Exam: Physical Exam Vitals and nursing note reviewed.  Constitutional:      Appearance: She is well-developed.  HENT:     Head: Normocephalic and atraumatic.  Eyes:     Conjunctiva/sclera: Conjunctivae normal.     Pupils: Pupils are equal, round, and reactive to light.  Cardiovascular:     Heart sounds: Normal heart sounds.  Pulmonary:     Effort: Pulmonary effort is normal.  Abdominal:     Palpations: Abdomen is soft.  Musculoskeletal:        General: Normal range of motion.     Cervical back: Normal range of motion.  Skin:    General: Skin is warm and dry.  Neurological:     Mental Status: She is alert. She is disoriented.     Gait: Gait abnormal.  Psychiatric:        Attention and Perception: She is inattentive.        Mood and Affect: Affect is inappropriate.        Speech: She is noncommunicative. Speech is tangential.        Behavior: Behavior is agitated. Behavior is not aggressive.        Thought Content: Thought content is paranoid. Thought content does not include homicidal or suicidal ideation.        Cognition and Memory: Cognition is impaired. Memory is impaired.        Judgment: Judgment is inappropriate.     Review of Systems  Unable to perform ROS: Psychiatric disorder  Constitutional: Negative.   HENT: Negative.   Eyes: Negative.   Respiratory: Negative.   Cardiovascular: Negative.   Gastrointestinal: Negative.   Musculoskeletal: Negative.   Skin: Negative.   Neurological: Negative.   Psychiatric/Behavioral: Positive for behavioral  problems and decreased concentration.    Blood pressure (!) 150/102, pulse 84, temperature 98.4 F (36.9 C), temperature source Oral, resp. rate 20, height 5\' 2"  (1.575 m), weight 67.6 kg, SpO2 100 %.Body mass index is 27.25 kg/m.  General Appearance: Disheveled  Eye Contact:  Minimal  Speech:  Garbled, Slow and Slurred  Volume:  Decreased  Mood:  Irritable  Affect:  Inappropriate  Thought Process:  Disorganized  Orientation:  Negative  Thought Content:  Negative  Suicidal Thoughts:  No  Homicidal Thoughts:  No  Memory:  Negative  Judgement:  Negative  Insight:  Negative  Psychomotor Activity:  Restlessness  Concentration:  Concentration: Negative  Recall:  Negative  Fund of Knowledge:  Negative  Language:  Negative  Akathisia:  No  Handed:  Right  AIMS (if indicated):     Assets:  Social Support  ADL's:  Impaired  Cognition:  Impaired,  Moderate  Sleep:  Number of Hours: 0     Have you used any form of tobacco in the last 30 days? (Cigarettes, Smokeless Tobacco, Cigars, and/or Pipes): No  Has this patient used any form of tobacco in the last 30 days? (Cigarettes, Smokeless Tobacco, Cigars, and/or Pipes) Yes, No  Blood Alcohol level:  Lab Results  Component Value Date   ETH 412 ([facility]) 10/06/2020   ETH <10 01/29/2020    Metabolic Disorder Labs:  Lab Results  Component Value Date   HGBA1C 4.4 (L) 06/11/2019   MPG 79.58 06/11/2019   MPG 189 05/22/2016   No results found for: PROLACTIN Lab Results  Component Value Date   CHOL 173 06/11/2019   TRIG 61 06/11/2019   HDL 41 06/11/2019   CHOLHDL 4.2 06/11/2019   VLDL 12 06/11/2019   LDLCALC NOT CALCULATED 06/11/2019    See Psychiatric Specialty Exam and Suicide Risk Assessment completed by Attending Physician prior to discharge.  Discharge destination:  Other:  Transferred to medical intensive care unit of Hospital  Is patient on multiple antipsychotic therapies at discharge:  No   Has Patient had three or more  failed trials of antipsychotic monotherapy by history:  No  Recommended Plan for Multiple Antipsychotic Therapies: NA  Discharge Instructions    Diet - low sodium heart healthy   Complete by: As directed    Increase activity slowly   Complete by: As directed      Allergies as of 10/10/2020      Reactions   Lisinopril Cough      Medication List    STOP taking these medications   ferrous sulfate 325 (65 FE) MG tablet     TAKE these medications     Indication  carvedilol 25 MG tablet Commonly known as: COREG Take 1 tablet (25 mg total) by mouth 2 (two) times daily with a meal.  Indication: High Blood Pressure Disorder   chlordiazePOXIDE 25 MG capsule Commonly known as: LIBRIUM 50mg  PO TID x 1D, then 25-50mg  PO BID X 1D, then 25-50mg  PO QD X 1D  Indication: Acute Alcohol Withdrawal Syndrome   folic acid 1 MG tablet Commonly known as: FOLVITE Take 1 tablet (1 mg total) by mouth daily.  Indication: Supplementation   losartan 50 MG tablet Commonly known as: COZAAR Take 1 tablet (50 mg total) by mouth daily.  Indication: High Blood Pressure Disorder   thiamine 100 MG tablet Commonly known as: Vitamin B-1 Take 1 tablet (100 mg total) by mouth daily.  Indication: Supplementation        Follow-up recommendations:  Activity:  Activity per the needs of acute medical treatment Diet:  Diet as ordered Other:  Patient will be treated by medical service.  Psychiatry can follow-up.  Comments: Case reviewed with hospitalist on call.  I have recommended that the patient would probably be best served in an ICU bed if available.  Plan is to discharge patient from psychiatry for transfer upstairs.  Patient has been informed of the plan and was not oppositional although I am unclear how much she understands.  I will attempt to communicate with the designated contact in the chart as well about the change in location  Signed: , MD 10/10/2020, 2:02 PM

## 2020-10-10 NOTE — [facility] Group Notes (Signed)
[facility] Group Notes: (Clinical Social Work)   10/10/2020      Type of Therapy:  Group Therapy   Participation Level:  Did Not Attend - was invited individually by Nurse/MHT and chose not to attend.   Susa Simmonds, LCSWA 10/10/2020  1:18 PM

## 2020-10-10 NOTE — BHH Suicide Risk Assessment (Signed)
[facility] Discharge Suicide Risk Assessment   Principal Problem: Severe recurrent major depression without psychotic features (HCC) Discharge Diagnoses: Principal Problem:   Severe recurrent major depression without psychotic features (HCC) Active Problems:   Hyperglycemia   Hypertension   Suicidal ideation   Alcohol use disorder, severe, dependence (HCC)   Total Time spent with patient: 30 minutes  Musculoskeletal: Strength & Muscle Tone: within normal limits Gait & Station: unsteady Patient leans: N/A  Psychiatric Specialty Exam: Review of Systems  Constitutional: Negative.   HENT: Negative.   Eyes: Negative.   Respiratory: Negative.   Cardiovascular: Negative.   Gastrointestinal: Negative.   Musculoskeletal: Negative.   Skin: Negative.   Neurological: Negative.   Psychiatric/Behavioral: Positive for agitation.    Blood pressure (!) 150/102, pulse 84, temperature 98.4 F (36.9 C), temperature source Oral, resp. rate 20, height 5\' 2"  (1.575 m), weight 67.6 kg, SpO2 100 %.Body mass index is 27.25 kg/m.  General Appearance: Disheveled  Eye Contact::  Fair  Speech:  Garbled and Slurred409  Volume:  Decreased  Mood:  Irritable  Affect:  Inappropriate  Thought Process:  Disorganized  Orientation:  Negative  Thought Content:  Negative  Suicidal Thoughts:  No  Homicidal Thoughts:  No  Memory:  Negative  Judgement:  Negative  Insight:  Negative  Psychomotor Activity:  Restlessness  Concentration:  Poor  Recall:  Poor  Fund of Knowledge:Fair  Language: Fair  Akathisia:  No  Handed:  Right  AIMS (if indicated):     Assets:  Social Support  Sleep:  Number of Hours: 0  Cognition: Impaired,  Moderate  ADL's:  Impaired   Mental Status Per Nursing Assessment::   On Admission:  NA  Demographic Factors:  Living alone  Loss Factors: Decline in physical health  Historical Factors: Impulsivity  Risk Reduction Factors:   Positive therapeutic relationship  Continued  Clinical Symptoms:  Alcohol/Substance Abuse/Dependencies  Cognitive Features That Contribute To Risk:  Loss of executive function    Suicide Risk:  Minimal: No identifiable suicidal ideation.  Patients presenting with no risk factors but with morbid ruminations; may be classified as minimal risk based on the severity of the depressive symptoms    Plan Of Care/Follow-up recommendations:  Activity:  Patient is to be transferred to intensive care unit for treatment of acute delirium Diet:  Diet as per ordered Other:  Communication completed with hospitalist.  Suicidal behavior not currently an active issue.  Psychiatry will continue to follow.  [phone], MD 10/10/2020, 1:58 PM

## 2020-10-10 NOTE — H&P (Addendum)
History and Physical    Allison [name] NTI:[id number] DOB: 20-Apr-1971 DOA: 10/10/2020  PCP: Pcp, No   Patient coming from: [facility]  I have personally briefly reviewed patient's old medical records in [facility]  Chief Complaint: Change in mental status Most of the history is obtained from behavioral health unit staff.  Patient is lethargic and unable to provide any history.  HPI: Allison [name] is a 49 y.o. female with medical history significant for depression, alcohol use, suicidal and homicidal ideation who was admitted to the behavioral health unit for stabilization.  Per documentation patient drinks excessive alcohol and had recently lost her job.  Her husband also left her 3 weeks ago and took their 5 daughters with them.  Patient has had suicidal and homicidal ideation since then. Received a call from the psychiatrist because of concerns for possible delirium tremens.  Patient with increased agitation requiring increased doses of lorazepam.  She also received a dose of clonidine.  At the time of my encounter with the patient she was lethargic and unable to provide any history.   Transfer to stepdown/ICU was indicated because patient may need Precedex. I am unable to do a review of systems on this patient due to her mental status Labs show sodium of 136, potassium 3.1, chloride 96, bicarb 26, glucose 131, BUN 36 compared to baseline of 15, creatinine 2.75 above baseline of 0.85, calcium 9.9, alkaline phosphatase 231, albumin 4.6, AST 291, ALT 87, total protein 9.2, white count 4.9, hemoglobin 10.7, hematocrit 32.5, MCV 81.7, RDW 18.5, platelet count 70,000. Respiratory viral panel is negative Urine drug screen is negative    ED Course: N/A  Review of Systems: As per HPI otherwise 10 point review of systems negative.   Past Medical History:  Diagnosis Date  . GERD (gastroesophageal reflux disease)   . Hypertension     No past surgical history on file.   reports that she  has never smoked. She has never used smokeless tobacco. She reports current alcohol use of about 14.0 standard drinks of alcohol per week. She reports current drug use. Drug: Marijuana.  Allergies  Allergen Reactions  . Lisinopril Cough    Family History  Problem Relation Age of Onset  . Hypertension Mother   . Diabetes Mother   . Hypertension Father      Prior to Admission medications   Medication Sig Start Date End Date Taking? Authorizing Provider  carvedilol (COREG) 25 MG tablet Take 1 tablet (25 mg total) by mouth 2 (two) times daily with a meal. Patient not taking: Reported on 06/07/2020 02/01/20   Albertine Grates, MD  chlordiazePOXIDE (LIBRIUM) 25 MG capsule 50mg  PO TID x 1D, then 25-50mg  PO BID X 1D, then 25-50mg  PO QD X 1D Patient not taking: Reported on 10/07/2020 06/07/20   06/09/20, PA-C  folic acid (FOLVITE) 1 MG tablet Take 1 tablet (1 mg total) by mouth daily. Patient not taking: Reported on 01/29/2020 06/13/19   06/15/19, NP  losartan (COZAAR) 50 MG tablet Take 1 tablet (50 mg total) by mouth daily. Patient not taking: Reported on 06/07/2020 02/01/20   03/31/20, MD  thiamine (VITAMIN B-1) 100 MG tablet Take 1 tablet (100 mg total) by mouth daily. Patient not taking: Reported on 06/07/2020 02/01/20   03/31/20, MD    Physical Exam: Vitals:   10/10/20 1612  Pulse: 74  Resp: 20  SpO2: 99%     Vitals:   10/10/20 1612  Pulse: 74  Resp: 20  SpO2: 99%    Constitutional: Lethargic, arouses to loud verbal stimuli Eyes: PERRL, lids and conjunctivae normal ENMT: Mucous membranes are dry.  Neck: normal, supple, no masses, no thyromegaly Respiratory: clear to auscultation bilaterally, no wheezing, no crackles. Normal respiratory effort. No accessory muscle use.  Cardiovascular: Regular rate and rhythm, no murmurs / rubs / gallops. No extremity edema. 2+ pedal pulses. No carotid bruits.  Abdomen: no tenderness, no masses palpated. No hepatosplenomegaly. Bowel sounds  positive.  Musculoskeletal: no clubbing / cyanosis. No joint deformity upper and lower extremities.  Skin: no rashes, lesions, ulcers.  Neurologic: Unable to assess Psychiatric: Unable to assess  Labs on Admission: I have personally reviewed following labs and imaging studies  CBC: Recent Labs  Lab 10/06/20 2240 10/09/20 1419  WBC 3.3* 4.9  HGB 11.1* 10.7*  HCT 34.1* 32.5*  MCV 82.6 81.7  PLT 106* 70*   Basic Metabolic Panel: Recent Labs  Lab 10/06/20 2240 10/09/20 1419 10/10/20 1314  NA 143 133* 136  K 2.8* 3.9 3.1*  CL 100 97* 96*  CO2 29 22 26   GLUCOSE 135* 224* 131*  BUN 6 15 36*  CREATININE 0.85 1.93* 2.75*  CALCIUM 8.2* 8.4* 9.9  MG  --  1.1*  --   PHOS  --  2.2*  --    GFR: Estimated Creatinine Clearance: 22.6 mL/min (A) (by C-G formula based on SCr of 2.75 mg/dL (H)). Liver Function Tests: Recent Labs  Lab 10/06/20 2240 10/09/20 1419 10/10/20 1314  AST 250* 299* 291*  ALT 66* 73* 87*  ALKPHOS 198* 216* 231*  BILITOT 1.1 1.7* 1.7*  PROT 7.9 8.0 9.2*  ALBUMIN 3.9 3.6 4.6   No results for input(s): LIPASE, AMYLASE in the last 168 hours. No results for input(s): AMMONIA in the last 168 hours. Coagulation Profile: No results for input(s): INR, PROTIME in the last 168 hours. Cardiac Enzymes: No results for input(s): CKTOTAL, CKMB, CKMBINDEX, TROPONINI in the last 168 hours. BNP (last 3 results) No results for input(s): PROBNP in the last 8760 hours. HbA1C: No results for input(s): HGBA1C in the last 72 hours. CBG: No results for input(s): GLUCAP in the last 168 hours. Lipid Profile: No results for input(s): CHOL, HDL, LDLCALC, TRIG, CHOLHDL, LDLDIRECT in the last 72 hours. Thyroid Function Tests: No results for input(s): TSH, T4TOTAL, FREET4, T3FREE, THYROIDAB in the last 72 hours. Anemia Panel: No results for input(s): VITAMINB12, FOLATE, FERRITIN, TIBC, IRON, RETICCTPCT in the last 72 hours. Urine analysis:    Component Value Date/Time    COLORURINE YELLOW 06/07/2020 0222   APPEARANCEUR HAZY (A) 06/07/2020 0222   LABSPEC >1.046 (H) 06/07/2020 0222   PHURINE 6.0 06/07/2020 0222   GLUCOSEU NEGATIVE 06/07/2020 0222   HGBUR SMALL (A) 06/07/2020 0222   BILIRUBINUR NEGATIVE 06/07/2020 0222   KETONESUR 20 (A) 06/07/2020 0222   PROTEINUR 100 (A) 06/07/2020 0222   UROBILINOGEN 0.2 03/18/2008 0140   NITRITE POSITIVE (A) 06/07/2020 0222   LEUKOCYTESUR NEGATIVE 06/07/2020 0222    Radiological Exams on Admission: No results found.  EKG: Independently reviewed.   Assessment/Plan Principal Problem:   Alcohol withdrawal delirium (HCC) Active Problems:   Hypertension   AKI (acute kidney injury) (HCC)   Thrombocytopenia (HCC)   Alcohol use disorder, severe, dependence (HCC)   Major depressive disorder, recurrent episode, severe (HCC)   Hypokalemia     Alcohol withdrawal delirium Patient with a history of alcohol abuse admitted to the behavioral health unit for  stabilization of her depression with suicidal and homicidal ideation Patient developed increasing agitation requiring multiple doses of lorazepam without any success Patient may benefit from Precedex We will place patient on CIWA protocol administer lorazepam for CIWA score of 8 or greater   Acute kidney injury Secondary to poor oral intake At baseline patient has a serum creatinine of 0.5 and today on admission it is 2.75 Hold losartan for now Aggressive IV fluid hydration with banana bag Repeat renal parameters in a.m.   Thrombocytopenia Secondary to alcohol use/abuse No evidence of bleeding at this time Monitor closely for bleeding   Hypertension Continue carvedilol Patient may require IV hydralazine/clonidine to optimize blood pressure control   Major depression Patient admitted to the behavioral health unit for stabilization of her major depression with suicidal and homicidal ideations Discussed with psychiatrist, Dr Toni Amend patient will need  one-to-one watch for safety/fall precautions.  Patient does not have any active suicidal or homicidal ideations at this time. We will request psychiatry consult   Hypokalemia Supplement potassium    Transaminitis Patient with elevated liver enzymes most likely related to alcohol use AST 3 x ALT   DVT prophylaxis: SCD Code Status: Full code Family Communication: Called and left a voicemail for patient's sister Birdena Jubilee who is listed as her emergency contact.  Awaiting callback Disposition Plan: Back to previous home environment Consults called: Psychiatry    Chani Ghanem MD [facility]     10/10/2020, 4:14 PM

## 2020-10-10 NOTE — Plan of Care (Signed)
Has been in the hallway restless and anxious, disorganized and paranoid. Did not take her PO medications.

## 2020-10-10 NOTE — Plan of Care (Signed)
  Problem: Education: Goal: Knowledge of [facility] Education information/materials will improve Outcome: Not Progressing Goal: Emotional status will improve Outcome: Not Progressing Goal: Mental status will improve Outcome: Not Progressing   Problem: Health Behavior/Discharge Planning: Goal: Identification of resources available to assist in meeting health care needs will improve Outcome: Not Progressing Goal: Compliance with treatment plan for underlying cause of condition will improve Outcome: Not Progressing

## 2020-10-10 NOTE — BHH Counselor (Signed)
CSW attempted to complete PSA and SPE. Patient was taking her clothes off this morning, showing signs of paranoia, and is disorganized. Patient was also holding her meal tray up in the air and staring at it.

## 2020-10-10 NOTE — Progress Notes (Signed)
Patient has remained disorganized with hallucinations. Restless and anxious with bizarre behaviors. Ativan 2 mg given IM. Safety monitored as expected

## 2020-10-11 LAB — CBC
HCT: 28.7 % — ABNORMAL LOW (ref 36.0–46.0)
Hemoglobin: 9.4 g/dL — ABNORMAL LOW (ref 12.0–15.0)
MCH: 27.1 pg (ref 26.0–34.0)
MCHC: 32.8 g/dL (ref 30.0–36.0)
MCV: 82.7 fL (ref 80.0–100.0)
Platelets: 71 10*3/uL — ABNORMAL LOW (ref 150–400)
RBC: 3.47 MIL/uL — ABNORMAL LOW (ref 3.87–5.11)
RDW: 19.3 % — ABNORMAL HIGH (ref 11.5–15.5)
WBC: 6 10*3/uL (ref 4.0–10.5)
nRBC: 0.3 % — ABNORMAL HIGH (ref 0.0–0.2)

## 2020-10-11 LAB — HEPATIC FUNCTION PANEL
ALT: 76 U/L — ABNORMAL HIGH (ref 0–44)
AST: 267 U/L — ABNORMAL HIGH (ref 15–41)
Albumin: 3.3 g/dL — ABNORMAL LOW (ref 3.5–5.0)
Alkaline Phosphatase: 175 U/L — ABNORMAL HIGH (ref 38–126)
Bilirubin, Direct: 0.4 mg/dL — ABNORMAL HIGH (ref 0.0–0.2)
Indirect Bilirubin: 0.7 mg/dL (ref 0.3–0.9)
Total Bilirubin: 1.1 mg/dL (ref 0.3–1.2)
Total Protein: 6.8 g/dL (ref 6.5–8.1)

## 2020-10-11 LAB — BASIC METABOLIC PANEL
Anion gap: 7 (ref 5–15)
BUN: 29 mg/dL — ABNORMAL HIGH (ref 6–20)
CO2: 29 mmol/L (ref 22–32)
Calcium: 8.9 mg/dL (ref 8.9–10.3)
Chloride: 101 mmol/L (ref 98–111)
Creatinine, Ser: 1.77 mg/dL — ABNORMAL HIGH (ref 0.44–1.00)
GFR, Estimated: 33 mL/min — ABNORMAL LOW (ref >60)
Glucose, Bld: 215 mg/dL — ABNORMAL HIGH (ref 70–99)
Potassium: 3.2 mmol/L — ABNORMAL LOW (ref 3.5–5.1)
Sodium: 137 mmol/L (ref 135–145)

## 2020-10-11 LAB — IRON AND TIBC
Iron: 37 ug/dL (ref 28–170)
Saturation Ratios: 11 % (ref 10.4–31.8)
TIBC: 326 ug/dL (ref 250–450)
UIBC: 289 ug/dL

## 2020-10-11 LAB — FERRITIN: Ferritin: 196 ng/mL (ref 11–307)

## 2020-10-11 MED ORDER — MAGNESIUM SULFATE 4 GM/100ML IV SOLN
4.0000 g | Freq: Once | INTRAVENOUS | Status: DC
  Filled 2020-10-11: qty 100

## 2020-10-11 MED ORDER — ZOLPIDEM TARTRATE 5 MG PO TABS
5.0000 mg | ORAL_TABLET | Freq: Every evening | ORAL | Status: DC | PRN
  Administered 2020-10-11 – 2020-10-13 (×2): 5 mg via ORAL
  Filled 2020-10-11 (×2): qty 1

## 2020-10-11 MED ORDER — POTASSIUM CHLORIDE IN NACL 20-0.9 MEQ/L-% IV SOLN
INTRAVENOUS | Status: DC
  Filled 2020-10-11 (×6): qty 1000

## 2020-10-11 MED ORDER — KCL IN DEXTROSE-NACL 20-5-0.45 MEQ/L-%-% IV SOLN
INTRAVENOUS | Status: DC
  Filled 2020-10-11 (×3): qty 1000

## 2020-10-11 MED ORDER — MAGNESIUM SULFATE 2 GM/50ML IV SOLN
2.0000 g | Freq: Once | INTRAVENOUS | Status: AC
  Administered 2020-10-11: 2 g via INTRAVENOUS
  Filled 2020-10-11: qty 50

## 2020-10-11 MED ORDER — IBUPROFEN 400 MG PO TABS
600.0000 mg | ORAL_TABLET | Freq: Four times a day (QID) | ORAL | Status: DC | PRN
  Administered 2020-10-11 (×2): 600 mg via ORAL
  Filled 2020-10-11 (×2): qty 2

## 2020-10-11 MED ORDER — THIAMINE HCL 100 MG PO TABS
100.0000 mg | ORAL_TABLET | Freq: Every day | ORAL | Status: DC
  Administered 2020-10-11 – 2020-10-13 (×3): 100 mg via ORAL
  Filled 2020-10-11 (×3): qty 1

## 2020-10-11 MED ORDER — TRAMADOL HCL 50 MG PO TABS
50.0000 mg | ORAL_TABLET | Freq: Once | ORAL | Status: AC
  Administered 2020-10-11: 50 mg via ORAL
  Filled 2020-10-11: qty 1

## 2020-10-11 NOTE — Progress Notes (Signed)
Clarified with Dr. Toni Amend and Dr. Ashok Pall about need for suicide precautions. Both doctors agreed that pt does not meet the need for suicide precautions at this time, but to continue IVC.

## 2020-10-11 NOTE — Plan of Care (Signed)
Pt transferred from ICU.  IVC. Sitter at the bedside. Pt A&O x4.  Calm and cooperative.  CIWA score 0. Ibuprofen given for generalized pain with improvement.

## 2020-10-11 NOTE — Progress Notes (Addendum)
PROGRESS NOTE    [name]  FWY:[id number] DOB: [birth year] DOA: 10/10/2020 PCP: Oneita Hurt, No     Brief Narrative:   Allison [name] is a 49 y.o. female with medical history significant for depression, alcohol use, suicidal and homicidal ideation who was admitted to the behavioral health unit for stabilization.  Per documentation patient drinks excessive alcohol and had recently lost her job.  Her husband also left her 3 weeks ago and took their 5 daughters with them.  Patient has had suicidal and homicidal ideation since then. Received a call from the psychiatrist because of concerns for possible delirium tremens.  Patient with increased agitation requiring increased doses of lorazepam.  She also received a dose of clonidine.  At the time of my encounter with the patient she was lethargic and unable to provide any history.   Transfer to stepdown/ICU was indicated because patient may need Precedex. I am unable to do a review of systems on this patient due to her mental status Labs show sodium of 136, potassium 3.1, chloride 96, bicarb 26, glucose 131, BUN 36 compared to baseline of 15, creatinine 2.75 above baseline of 0.85, calcium 9.9, alkaline phosphatase 231, albumin 4.6, AST 291, ALT 87, total protein 9.2, white count 4.9, hemoglobin 10.7, hematocrit 32.5, MCV 81.7, RDW 18.5, platelet count 70,000. Respiratory viral panel is negative Urine drug screen is negative   Assessment & Plan:   Principal Problem:   Alcohol withdrawal delirium (HCC) Active Problems:   Hypertension   AKI (acute kidney injury) (HCC)   Thrombocytopenia (HCC)   Alcohol use disorder, severe, dependence (HCC)   Major depressive disorder, recurrent episode, severe (HCC)   Hypokalemia   # Alcohol withdrawal delirium Patient with a history of alcohol abuse admitted to the behavioral health unit for stabilization of her depression with suicidal and homicidal ideation. Patient developed increasing agitation  requiring multiple doses of lorazepam. Admitted to hospitalist service and to ICU given potential need for precedex. Overnight low CIWA scores, received ativan x1 but just to help to sleep. Arrived to ED 10/12, per ED report had been "drinking substantially" but unclear timing of last drink. - transfer to progressive bed - continue ativan CIWA - cont daily thiamine - groggy this morning, monitor for Wernicke's   # Acute kidney injury  Baseline creatinine of normal, peaked at 2.75 this admission, likely 2/2 decreased po. Improved to 1.71 this AM. - continue IV fluids at 125/hr - monitor  # Anemia - likely 2/2 chronic disease, w/u at recent hospitalization suggestive of such, fecal occult blood reported to be neg that admission. Here h 9.4, normocytic - will repeat iron studies  # Alcoholic liver disease  With markedly elevated ast to alt ratio, thrombocytopenia. Steatosis seen on liver u/s earlier this year. Appears stable. Negative hepatitis serologies earlier this year. - monitor  # Hypertension Today bp wnl - Continue home carvedilol, hold home losartan  # Cardiac murmur - moderate - will plan on TTE this hospitalization  # Major depression with suicidal ideation psychotic features Patient admitted to the behavioral health unit for stabilization of her major depression with suicidal and homicidal ideations. Admitting provider discussed with psychiatrist, Dr Toni Amend patient will need one-to-one watch for safety/fall precautions. - psychiatry consulted and will return to psychiatry when medically cleared  # Hypokalemia 3.2 today, mg low to 1.3 yesterday - 20 meq kcl with fluids, 2 mg IV Mg and f/u Mg level for today    DVT prophylaxis: SCDs given thrombocytopenia  Code Status: full Family Communication: attempted to update sister telephonically today, no answer  Status is: Inpatient  Remains inpatient appropriate because:Inpatient level of care appropriate due to  severity of illness   Dispo: The patient is from: Home              Anticipated d/c is to: psychiatry service              Anticipated d/c date is: 2 days              Patient currently is not medically stable to d/c.      Consultants:  psychiatry  Procedures: none  Antimicrobials:  none    Subjective: This morning sleepy, no specific complaints. No agitation overnight  Objective: Vitals:   10/11/20 0600 10/11/20 0615 10/11/20 0630 10/11/20 0645  BP: 130/81     Pulse: 71 71 62 81  Resp: 13 20 19 17   Temp:      TempSrc:      SpO2: 98% 97% 98% 97%  Weight:      Height:       No intake or output data in the 24 hours ending 10/11/20 0734 Filed Weights   10/10/20 1612  Weight: 67.5 kg    Examination:  General exam: Appears calm and comfortable. Sleepy but arousable Respiratory system: Clear to auscultation. Respiratory effort normal. Cardiovascular system: S1 & S2 heard, moderate systolic murmur Gastrointestinal system: Abdomen is nondistended, soft and nontender. No organomegaly or masses felt. Normal bowel sounds heard. Central nervous system: somnolent. No focal neurological deficits. Extremities: Symmetric 5 x 5 power. Skin: No rashes, lesions or ulcers Psychiatry: unable to assess     Data Reviewed: I have personally reviewed following labs and imaging studies  CBC: Recent Labs  Lab 10/06/20 2240 10/09/20 1419 10/11/20 0259  WBC 3.3* 4.9 6.0  HGB 11.1* 10.7* 9.4*  HCT 34.1* 32.5* 28.7*  MCV 82.6 81.7 82.7  PLT 106* 70* 71*   Basic Metabolic Panel: Recent Labs  Lab 10/06/20 2240 10/09/20 1419 10/10/20 1314 10/11/20 0259  NA 143 133* 136 137  K 2.8* 3.9 3.1* 3.2*  CL 100 97* 96* 101  CO2 29 22 26 29   GLUCOSE 135* 224* 131* 215*  BUN 6 15 36* 29*  CREATININE 0.85 1.93* 2.75* 1.77*  CALCIUM 8.2* 8.4* 9.9 8.9  MG  --  1.1* 1.3*  --   PHOS  --  2.2* 1.5*  --    GFR: Estimated Creatinine Clearance: 35.8 mL/min (A) (by C-G formula based  on SCr of 1.77 mg/dL (H)). Liver Function Tests: Recent Labs  Lab 10/06/20 2240 10/09/20 1419 10/10/20 1314  AST 250* 299* 291*  ALT 66* 73* 87*  ALKPHOS 198* 216* 231*  BILITOT 1.1 1.7* 1.7*  PROT 7.9 8.0 9.2*  ALBUMIN 3.9 3.6 4.6   No results for input(s): LIPASE, AMYLASE in the last 168 hours. No results for input(s): AMMONIA in the last 168 hours. Coagulation Profile: No results for input(s): INR, PROTIME in the last 168 hours. Cardiac Enzymes: No results for input(s): CKTOTAL, CKMB, CKMBINDEX, TROPONINI in the last 168 hours. BNP (last 3 results) No results for input(s): PROBNP in the last 8760 hours. HbA1C: No results for input(s): HGBA1C in the last 72 hours. CBG: No results for input(s): GLUCAP in the last 168 hours. Lipid Profile: No results for input(s): CHOL, HDL, LDLCALC, TRIG, CHOLHDL, LDLDIRECT in the last 72 hours. Thyroid Function Tests: No results for input(s): TSH, T4TOTAL, FREET4, T3FREE, THYROIDAB  in the last 72 hours. Anemia Panel: No results for input(s): VITAMINB12, FOLATE, FERRITIN, TIBC, IRON, RETICCTPCT in the last 72 hours. Urine analysis:    Component Value Date/Time   COLORURINE YELLOW 06/07/2020 0222   APPEARANCEUR HAZY (A) 06/07/2020 0222   LABSPEC >1.046 (H) 06/07/2020 0222   PHURINE 6.0 06/07/2020 0222   GLUCOSEU NEGATIVE 06/07/2020 0222   HGBUR SMALL (A) 06/07/2020 0222   BILIRUBINUR NEGATIVE 06/07/2020 0222   KETONESUR 20 (A) 06/07/2020 0222   PROTEINUR 100 (A) 06/07/2020 0222   UROBILINOGEN 0.2 03/18/2008 0140   NITRITE POSITIVE (A) 06/07/2020 0222   LEUKOCYTESUR NEGATIVE 06/07/2020 0222   Sepsis Labs: @LABRCNTIP (procalcitonin:4,lacticidven:4)  ) Recent Results (from the past 240 hour(s))  Respiratory Panel by RT PCR (Flu A&B, Covid) - Nasopharyngeal Swab     Status: None   Collection Time: 10/07/20  9:11 AM   Specimen: Nasopharyngeal Swab  Result Value Ref Range Status   SARS Coronavirus 2 by RT PCR NEGATIVE NEGATIVE Final      Comment: (NOTE) SARS-CoV-2 target nucleic acids are NOT DETECTED.  The SARS-CoV-2 RNA is generally detectable in upper respiratoy specimens during the acute phase of infection. The lowest concentration of SARS-CoV-2 viral copies this assay can detect is 131 copies/mL. A negative result does not preclude SARS-Cov-2 infection and should not be used as the sole basis for treatment or other patient management decisions. A negative result may occur with  improper specimen collection/handling, submission of specimen other than nasopharyngeal swab, presence of viral mutation(s) within the areas targeted by this assay, and inadequate number of viral copies (<131 copies/mL). A negative result must be combined with clinical observations, patient history, and epidemiological information. The expected result is Negative.  Fact Sheet for Patients:  https://www.moore.com/https://www.fda.gov/media/142436/download  Fact Sheet for Healthcare Providers:  https://www.young.biz/https://www.fda.gov/media/142435/download  This test is no t yet approved or cleared by the [place] FDA and  has been authorized for detection and/or diagnosis of SARS-CoV-2 by FDA under an Emergency Use Authorization (EUA). This EUA will remain  in effect (meaning this test can be used) for the duration of the COVID-19 declaration under Section 564(b)(1) of the Act, 21 U.S.C. section 360bbb-3(b)(1), unless the authorization is terminated or revoked sooner.     Influenza A by PCR NEGATIVE NEGATIVE Final   Influenza B by PCR NEGATIVE NEGATIVE Final    Comment: (NOTE) The Xpert Xpress SARS-CoV-2/FLU/RSV assay is intended as an aid in  the diagnosis of influenza from Nasopharyngeal swab specimens and  should not be used as a sole basis for treatment. Nasal washings and  aspirates are unacceptable for Xpert Xpress SARS-CoV-2/FLU/RSV  testing.  Fact Sheet for Patients: https://www.moore.com/https://www.fda.gov/media/142436/download  Fact Sheet for Healthcare  Providers: https://www.young.biz/https://www.fda.gov/media/142435/download  This test is not yet approved or cleared by the [place] FDA and  has been authorized for detection and/or diagnosis of SARS-CoV-2 by  FDA under an Emergency Use Authorization (EUA). This EUA will remain  in effect (meaning this test can be used) for the duration of the  Covid-19 declaration under Section 564(b)(1) of the Act, 21  U.S.C. section 360bbb-3(b)(1), unless the authorization is  terminated or revoked. Performed at [facility], [address] [place], [place] [address]   MRSA PCR Screening     Status: None   Collection Time: 10/10/20  4:13 PM   Specimen: Nasal Mucosa; Nasopharyngeal  Result Value Ref Range Status   MRSA by PCR NEGATIVE NEGATIVE Final    Comment:  The GeneXpert MRSA Assay (FDA approved for NASAL specimens only), is one component of a comprehensive MRSA colonization surveillance program. It is not intended to diagnose MRSA infection nor to guide or monitor treatment for MRSA infections. Performed at [facility], [address], [place], [place] [address]          Radiology Studies: No results found.      Scheduled Meds: . carvedilol  25 mg Oral BID WC  . Chlorhexidine Gluconate Cloth  6 each Topical Daily  . sodium chloride flush  3 mL Intravenous Q12H  . thiamine  100 mg Oral Daily   Continuous Infusions: . 0.9 % NaCl with KCl 20 mEq / L    . magnesium sulfate bolus IVPB       LOS: 1 day    Time spent: 40 min    Silvano Bilis, MD [facility]   If 7PM-7AM, please contact night-coverage [email or web address] Password [facility] 10/11/2020, 7:34 AM

## 2020-10-11 NOTE — Consult Note (Signed)
[facility] Face-to-Face Psychiatry Consult   Reason for Consult: Consult for this 49 year old woman with history of alcohol abuse who was transferred to the intensive care unit from the behavioral health unit due to delirium tremens Referring Physician:  Kasa Patient Identification: Allison [name] MRN:  [id number] Principal Diagnosis: Alcohol withdrawal delirium (HCC) Diagnosis:  Principal Problem:   Alcohol withdrawal delirium (HCC) Active Problems:   Hypertension   AKI (acute kidney injury) (HCC)   Thrombocytopenia (HCC)   Alcohol use disorder, severe, dependence (HCC)   Major depressive disorder, recurrent episode, severe (HCC)   Hypokalemia   Total Time spent with patient: 1 hour  Subjective:   Allison [name] is a 49 y.o. female patient admitted with patient currently is not arousable and not able to give history.Allison [name]  HPI: 49 year old woman was transferred to the [facility] unit from [place] a couple days ago after presenting to the emergency room in [place] with agitation reports of depression and reported suicidal and homicidal ideation.  Had been drinking heavily at that time.  Over the next day the patient became more confused and unsteady on her feet.  At night on the evening prior to transfer she was becoming agitated and nonredirectable attempting to escape from the ward refusing medication.  When I saw her yesterday afternoon she was frankly delirious.  Patient was wandering from room to room telling me that she was at her own home and planning to go attend a birthday party.  Trying to climb up on window ledges.  Lab studies have been followed sequentially and showed that her creatinine had been creeping up to 2.75 yesterday afternoon due to presumably to her dehydration.  Consult was requested from hospitalist.  Appreciate very much assistance in moving patient to the ICU.  Came by to see the patient today.  She is no longer on a Precedex drip.  Just  getting IV fluid.  Nevertheless still sleepy not arousable not giving lucid history at this time.  The sitter with her tells me that she has been quiet much of the day.  Past Psychiatric History: Patient has a past history of similar episodes.  Also a history of renal impairment related to dehydration so this is nothing new.  Has reported depression in the past primarily in the context of alcohol abuse.  No known history of suicide attempts or violence  Risk to Self:   Risk to Others:   Prior Inpatient Therapy:   Prior Outpatient Therapy:    Past Medical History:  Past Medical History:  Diagnosis Date  . GERD (gastroesophageal reflux disease)   . Hypertension    No past surgical history on file. Family History:  Family History  Problem Relation Age of Onset  . Hypertension Mother   . Diabetes Mother   . Hypertension Father    Family Psychiatric  History: See previous Social History:  Social History   Substance and Sexual Activity  Alcohol Use Yes  . Alcohol/week: 14.0 standard drinks  . Types: 14 Standard drinks or equivalent per week   Comment: pint of liquor     Social History   Substance and Sexual Activity  Drug Use Yes  . Types: Marijuana    Social History   Socioeconomic History  . Marital status: Married    Spouse name: Not on file  . Number of children: Not on file  . Years of education: Not on file  . Highest education level: Not on file  Occupational History  .  Not on file  Tobacco Use  . Smoking status: Never Smoker  . Smokeless tobacco: Never Used  Substance and Sexual Activity  . Alcohol use: Yes    Alcohol/week: 14.0 standard drinks    Types: 14 Standard drinks or equivalent per week    Comment: pint of liquor  . Drug use: Yes    Types: Marijuana  . Sexual activity: Yes    Birth control/protection: Implant  Other Topics Concern  . Not on file  Social History Narrative  . Not on file   Social Determinants of Health   Financial Resource  Strain:   . Difficulty of Paying Living Expenses: Not on file  Food Insecurity:   . Worried About Programme researcher, broadcasting/film/video in the Last Year: Not on file  . Ran Out of Food in the Last Year: Not on file  Transportation Needs:   . Lack of Transportation (Medical): Not on file  . Lack of Transportation (Non-Medical): Not on file  Physical Activity:   . Days of Exercise per Week: Not on file  . Minutes of Exercise per Session: Not on file  Stress:   . Feeling of Stress : Not on file  Social Connections:   . Frequency of Communication with Friends and Family: Not on file  . Frequency of Social Gatherings with Friends and Family: Not on file  . Attends Religious Services: Not on file  . Active Member of Clubs or Organizations: Not on file  . Attends Banker Meetings: Not on file  . Marital Status: Not on file   Additional Social History:    Allergies:   Allergies  Allergen Reactions  . Lisinopril Cough    Labs:  Results for orders placed or performed during the hospital encounter of 10/10/20 (from the past 48 hour(s))  Magnesium     Status: Abnormal   Collection Time: 10/10/20  1:14 PM  Result Value Ref Range   Magnesium 1.3 (L) 1.7 - 2.4 mg/dL    Comment: Performed at [facility], [address] [place], [place] [address]  Phosphorus     Status: Abnormal   Collection Time: 10/10/20  1:14 PM  Result Value Ref Range   Phosphorus 1.5 (L) 2.5 - 4.6 mg/dL    Comment: Performed at [facility], [address] [place], [place] [address]  MRSA PCR Screening     Status: None   Collection Time: 10/10/20  4:13 PM   Specimen: Nasal Mucosa; Nasopharyngeal  Result Value Ref Range   MRSA by PCR NEGATIVE NEGATIVE    Comment:        The GeneXpert MRSA Assay (FDA approved for NASAL specimens only), is one component of a comprehensive MRSA colonization surveillance program. It is not intended to diagnose MRSA infection nor to guide or monitor treatment  for MRSA infections. Performed at [facility], [address] [place], [place] [address]   Basic metabolic panel     Status: Abnormal   Collection Time: 10/11/20  2:59 AM  Result Value Ref Range   Sodium 137 135 - 145 mmol/L   Potassium 3.2 (L) 3.5 - 5.1 mmol/L   Chloride 101 98 - 111 mmol/L   CO2 29 22 - 32 mmol/L   Glucose, Bld 215 (H) 70 - 99 mg/dL    Comment: Glucose reference range applies only to samples taken after fasting for at least 8 hours.   BUN 29 (H) 6 - 20 mg/dL   Creatinine, Ser 8.12 (  H) 0.44 - 1.00 mg/dL   Calcium 8.9 8.9 - 69.610.3 mg/dL   GFR, Estimated 33 (L) >60 mL/min   Anion gap 7 5 - 15    Comment: Performed at [facility], [address] [place], [place] [address]  CBC     Status: Abnormal   Collection Time: 10/11/20  2:59 AM  Result Value Ref Range   WBC 6.0 4.0 - 10.5 K/uL   RBC 3.47 (L) 3.87 - 5.11 MIL/uL   Hemoglobin 9.4 (L) 12.0 - 15.0 g/dL   HCT 41.328.7 (L) 36 - 46 %   MCV 82.7 80.0 - 100.0 fL   MCH 27.1 26.0 - 34.0 pg   MCHC 32.8 30.0 - 36.0 g/dL   RDW 24.419.3 (H) 01.011.5 - 27.215.5 %   Platelets 71 (L) 150 - 400 K/uL    Comment: REPEATED TO VERIFY Immature Platelet Fraction may be clinically indicated, consider ordering this additional test [id number] CONSISTENT WITH PREVIOUS RESULT    nRBC 0.3 (H) 0.0 - 0.2 %    Comment: Performed at [facility], [address] [place], [place] [address]  Hepatic function panel     Status: Abnormal   Collection Time: 10/11/20  2:59 AM  Result Value Ref Range   Total Protein 6.8 6.5 - 8.1 g/dL   Albumin 3.3 (L) 3.5 - 5.0 g/dL   AST 956267 (H) 15 - 41 U/L   ALT 76 (H) 0 - 44 U/L   Alkaline Phosphatase 175 (H) 38 - 126 U/L   Total Bilirubin 1.1 0.3 - 1.2 mg/dL   Bilirubin, Direct 0.4 (H) 0.0 - 0.2 mg/dL   Indirect Bilirubin 0.7 0.3 - 0.9 mg/dL    Comment: Performed at [facility], [address] [place], [place] [address]  Iron and TIBC     Status: None   Collection Time:  10/11/20  2:59 AM  Result Value Ref Range   Iron 37 28 - 170 ug/dL   TIBC 433326 295250 - 188450 ug/dL   Saturation Ratios 11 10.4 - 31.8 %   UIBC 289 ug/dL    Comment: Performed at [facility], [address] [place], [place] [address]  Ferritin     Status: None   Collection Time: 10/11/20  2:59 AM  Result Value Ref Range   Ferritin 196 11 - 307 ng/mL    Comment: Performed at [facility], [address] [place], [place] [address]    Current Facility-Administered Medications  Medication Dose Route Frequency Provider Last Rate Last Admin  . 0.9 % NaCl with KCl 20 mEq/ L  infusion   Intravenous Continuous Kathrynn RunningWouk, Noah Bedford, MD 125 mL/hr at 10/11/20 0949 New Bag at 10/11/20 0949  . carvedilol (COREG) tablet 25 mg  25 mg Oral BID WC Agbata, Tochukwu, MD      . Chlorhexidine Gluconate Cloth 2 % PADS 6 each  6 each Topical Daily Agbata, Tochukwu, MD   6 each at 10/10/20 1714  . hydrALAZINE (APRESOLINE) injection 10 mg  10 mg Intravenous Q6H PRN Agbata, Tochukwu, MD      . ibuprofen (ADVIL) tablet 600 mg  600 mg Oral Q6H PRN Kathrynn RunningWouk, Noah Bedford, MD   600 mg at 10/11/20 0946  . LORazepam (ATIVAN) tablet 1-4 mg  1-4 mg Oral Q1H PRN Agbata, Tochukwu, MD   1 mg at 10/11/20 0251   Or  . LORazepam (ATIVAN) injection 1-4 mg  1-4 mg Intravenous Q1H PRN Lucile ShuttersAgbata, Tochukwu, MD      .  ondansetron (ZOFRAN) tablet 4 mg  4 mg Oral Q6H PRN Agbata, Tochukwu, MD       Or  . ondansetron (ZOFRAN) injection 4 mg  4 mg Intravenous Q6H PRN Agbata, Tochukwu, MD      . sodium chloride flush (NS) 0.9 % injection 3 mL  3 mL Intravenous Q12H Agbata, Tochukwu, MD   3 mL at 10/10/20 2301  . thiamine tablet 100 mg  100 mg Oral Daily Kathrynn Running, MD   100 mg at 10/11/20 8315    Musculoskeletal: Strength & Muscle Tone: decreased Gait & Station: unable to stand Patient leans: Right  Psychiatric Specialty Exam: Physical Exam Vitals and nursing note reviewed.  Constitutional:      Appearance: She  is well-developed.  HENT:     Head: Normocephalic and atraumatic.  Eyes:     Conjunctiva/sclera: Conjunctivae normal.     Pupils: Pupils are equal, round, and reactive to light.  Cardiovascular:     Heart sounds: Normal heart sounds.  Pulmonary:     Effort: Pulmonary effort is normal.  Abdominal:     Palpations: Abdomen is soft.  Musculoskeletal:        General: Normal range of motion.     Cervical back: Normal range of motion.  Skin:    General: Skin is warm and dry.  Neurological:     General: No focal deficit present.     Mental Status: She is alert.  Psychiatric:        Attention and Perception: She is inattentive.        Cognition and Memory: Cognition is impaired.     Review of Systems  Unable to perform ROS: Patient unresponsive    Blood pressure 127/72, pulse 63, temperature 98 F (36.7 C), temperature source Oral, resp. rate 13, height 5\' 3"  (1.6 m), weight 67.5 kg, SpO2 99 %.Body mass index is 26.36 kg/m.  General Appearance: Casual  Eye Contact:  Minimal  Speech:  Slow  Volume:  Decreased  Mood:  Euthymic  Affect:  Constricted  Thought Process:  Disorganized  Orientation:  Negative  Thought Content:  Negative  Suicidal Thoughts:  No  Homicidal Thoughts:  No  Memory:  Negative  Judgement:  Negative  Insight:  Negative  Psychomotor Activity:  Negative  Concentration:  Concentration: Negative  Recall:  Negative  Fund of Knowledge:  Negative  Language:  Negative  Akathisia:  Negative  Handed:  Right  AIMS (if indicated):     Assets:  Desire for Improvement Resilience  ADL's:  Impaired  Cognition:  Impaired,  Moderate  Sleep:        Treatment Plan Summary: Daily contact with patient to assess and evaluate symptoms and progress in treatment, Medication management and Plan Appreciate very much the assistance with this patient whose behavior and medical issues were becoming unmanageable on behavioral health.  She is calm her today and labs have all  improved back trending towards normal.  I will reevaluate the patient tomorrow.  I am not placing any orders now she can be managed by the decision in the ICU.  Once the patient is off of IV fluid and is lucid and ambulatory we will make a decision whether it is necessary to consider retransfer to psychiatry or if she may be simply discharged home.  Disposition: Discussed crisis plan, support from social network, calling 911, coming to the Emergency Department, and calling Suicide Hotline.  , MD 10/11/2020 2:00 PM

## 2020-10-12 ENCOUNTER — Inpatient Hospital Stay: Payer: 59

## 2020-10-12 ENCOUNTER — Other Ambulatory Visit: Payer: Self-pay

## 2020-10-12 ENCOUNTER — Encounter: Payer: Self-pay | Admitting: Internal Medicine

## 2020-10-12 LAB — COMPREHENSIVE METABOLIC PANEL
ALT: 127 U/L — ABNORMAL HIGH (ref 0–44)
AST: 360 U/L — ABNORMAL HIGH (ref 15–41)
Albumin: 3.1 g/dL — ABNORMAL LOW (ref 3.5–5.0)
Alkaline Phosphatase: 189 U/L — ABNORMAL HIGH (ref 38–126)
Anion gap: 9 (ref 5–15)
BUN: 25 mg/dL — ABNORMAL HIGH (ref 6–20)
CO2: 26 mmol/L (ref 22–32)
Calcium: 8.9 mg/dL (ref 8.9–10.3)
Chloride: 105 mmol/L (ref 98–111)
Creatinine, Ser: 1.23 mg/dL — ABNORMAL HIGH (ref 0.44–1.00)
GFR, Estimated: 52 mL/min — ABNORMAL LOW (ref >60)
Glucose, Bld: 132 mg/dL — ABNORMAL HIGH (ref 70–99)
Potassium: 3.6 mmol/L (ref 3.5–5.1)
Sodium: 140 mmol/L (ref 135–145)
Total Bilirubin: 0.8 mg/dL (ref 0.3–1.2)
Total Protein: 7 g/dL (ref 6.5–8.1)

## 2020-10-12 LAB — CK: Total CK: 395 U/L — ABNORMAL HIGH (ref 38–234)

## 2020-10-12 LAB — MAGNESIUM: Magnesium: 1.6 mg/dL — ABNORMAL LOW (ref 1.7–2.4)

## 2020-10-12 LAB — HEMOGLOBIN A1C
Hgb A1c MFr Bld: 6 % — ABNORMAL HIGH (ref 4.8–5.6)
Mean Plasma Glucose: 126 mg/dL

## 2020-10-12 LAB — VITAMIN B12: Vitamin B-12: 517 pg/mL (ref 180–914)

## 2020-10-12 LAB — CBC
HCT: 25.9 % — ABNORMAL LOW (ref 36.0–46.0)
Hemoglobin: 8.3 g/dL — ABNORMAL LOW (ref 12.0–15.0)
MCH: 27 pg (ref 26.0–34.0)
MCHC: 32 g/dL (ref 30.0–36.0)
MCV: 84.4 fL (ref 80.0–100.0)
Platelets: 77 10*3/uL — ABNORMAL LOW (ref 150–400)
RBC: 3.07 MIL/uL — ABNORMAL LOW (ref 3.87–5.11)
RDW: 19.9 % — ABNORMAL HIGH (ref 11.5–15.5)
WBC: 4.7 10*3/uL (ref 4.0–10.5)
nRBC: 0 % (ref 0.0–0.2)

## 2020-10-12 LAB — FOLATE: Folate: 12 ng/mL (ref >5.9)

## 2020-10-12 MED ORDER — TRAMADOL HCL 50 MG PO TABS: 50.0000 mg | ORAL_TABLET | Freq: Two times a day (BID) | ORAL | Status: DC | PRN

## 2020-10-12 MED ORDER — TRAMADOL HCL 50 MG PO TABS
50.0000 mg | ORAL_TABLET | Freq: Two times a day (BID) | ORAL | Status: DC | PRN
  Administered 2020-10-12: 50 mg via ORAL
  Filled 2020-10-12: qty 1

## 2020-10-12 MED ORDER — MAGNESIUM SULFATE 2 GM/50ML IV SOLN
2.0000 g | Freq: Once | INTRAVENOUS | Status: AC
  Administered 2020-10-12: 2 g via INTRAVENOUS
  Filled 2020-10-12: qty 50

## 2020-10-12 MED ORDER — AMLODIPINE BESYLATE 10 MG PO TABS
10.0000 mg | ORAL_TABLET | Freq: Every day | ORAL | Status: DC
  Administered 2020-10-12 – 2020-10-13 (×2): 10 mg via ORAL
  Filled 2020-10-12 (×2): qty 1

## 2020-10-12 MED ORDER — SODIUM CHLORIDE 0.9 % IV SOLN
INTRAVENOUS | Status: DC | PRN
  Administered 2020-10-12: 11:00:00 500 mL via INTRAVENOUS

## 2020-10-12 MED ORDER — SODIUM CHLORIDE 0.9 % IV SOLN: INTRAVENOUS | Status: DC

## 2020-10-12 NOTE — Consult Note (Signed)
[facility] Face-to-Face Psychiatry Consult   Reason for Consult: Follow-up for this 49 year old woman with alcohol abuse who had been referred to medicine for delirium tremens Referring Physician:  [facility] Patient Identification: Allison [name] MRN:  [id number] Principal Diagnosis: Alcohol withdrawal delirium (HCC) Diagnosis:  Principal Problem:   Alcohol withdrawal delirium (HCC) Active Problems:   Hypertension   AKI (acute kidney injury) (HCC)   Thrombocytopenia (HCC)   Alcohol use disorder, severe, dependence (HCC)   Major depressive disorder, recurrent episode, severe (HCC)   Hypokalemia   Total Time spent with patient: 30 minutes  Subjective:   Allison [name] is a 49 y.o. female patient admitted with "somebody wanted to check on me".  HPI: Patient seen chart reviewed.  This is a follow-up note for this 49 year old woman who had been transferred up to medicine on Saturday because of onset of delirium tremens beyond the ability of the psychiatry ward to treat.  On evaluation today she has been moved out of the intensive care unit.  She was asleep when I came in but easily arousable.  She engaged in appropriate conversation.  Patient was alert and oriented.  Admitted to having no memory of coming to [place].  [place] the reason she went to the hospital in [place] and the first time is that somebody wanted to do a wellness check on her.  She admits that she had been drinking heavily and says she realizes now she needs to stop.  Patient has no complaints about her mood.  Says she is emotionally feeling fine.  Denies any suicidal or homicidal thoughts.  Patient is not having any current hallucinations and appears to be no longer meeting commitment criteria  Past Psychiatric History: There've been recurrent episodes of the same syndrome  Risk to Self:   Risk to Others:   Prior Inpatient Therapy:   Prior Outpatient Therapy:    Past Medical History:  Past Medical History:  Diagnosis Date    GERD (gastroesophageal reflux disease)    Hypertension    History reviewed. No pertinent surgical history. Family History:  Family History  Problem Relation Age of Onset   Hypertension Mother    Diabetes Mother    Hypertension Father    Family Psychiatric  History: See previous Social History:  Social History   Substance and Sexual Activity  Alcohol Use Yes   Alcohol/week: 14.0 standard drinks   Types: 14 Standard drinks or equivalent per week   Comment: pint of liquor     Social History   Substance and Sexual Activity  Drug Use Yes   Types: Marijuana    Social History   Socioeconomic History   Marital status: Married    Spouse name: Not on file   Number of children: Not on file   Years of education: Not on file   Highest education level: Not on file  Occupational History   Not on file  Tobacco Use   Smoking status: Never Smoker   Smokeless tobacco: Never Used  Substance and Sexual Activity   Alcohol use: Yes    Alcohol/week: 14.0 standard drinks    Types: 14 Standard drinks or equivalent per week    Comment: pint of liquor   Drug use: Yes    Types: Marijuana   Sexual activity: Yes    Birth control/protection: Implant  Other Topics Concern   Not on file  Social History Narrative   Not on file   Social Determinants of Health   Financial Resource Strain:  Difficulty of Paying Living Expenses: Not on file  Food Insecurity:    Worried About Running Out of Food in the Last Year: Not on file   Ran Out of Food in the Last Year: Not on file  Transportation Needs:    Lack of Transportation (Medical): Not on file   Lack of Transportation (Non-Medical): Not on file  Physical Activity:    Days of Exercise per Week: Not on file   Minutes of Exercise per Session: Not on file  Stress:    Feeling of Stress : Not on file  Social Connections:    Frequency of Communication with Friends and Family: Not on file   Frequency of Social  Gatherings with Friends and Family: Not on file   Attends Religious Services: Not on file   Active Member of Clubs or Organizations: Not on file   Attends BankerClub or Organization Meetings: Not on file   Marital Status: Not on file   Additional Social History:    Allergies:   Allergies  Allergen Reactions   Lisinopril Cough    Labs:  Results for orders placed or performed during the hospital encounter of 10/10/20 (from the past 48 hour(s))  MRSA PCR Screening     Status: None   Collection Time: 10/10/20  4:13 PM   Specimen: Nasal Mucosa; Nasopharyngeal  Result Value Ref Range   MRSA by PCR NEGATIVE NEGATIVE    Comment:        The GeneXpert MRSA Assay (FDA approved for NASAL specimens only), is one component of a comprehensive MRSA colonization surveillance program. It is not intended to diagnose MRSA infection nor to guide or monitor treatment for MRSA infections. Performed at [facility], [address] [place], [place] [address]   Basic metabolic panel     Status: Abnormal   Collection Time: 10/11/20  2:59 AM  Result Value Ref Range   Sodium 137 135 - 145 mmol/L   Potassium 3.2 (L) 3.5 - 5.1 mmol/L   Chloride 101 98 - 111 mmol/L   CO2 29 22 - 32 mmol/L   Glucose, Bld 215 (H) 70 - 99 mg/dL    Comment: Glucose reference range applies only to samples taken after fasting for at least 8 hours.   BUN 29 (H) 6 - 20 mg/dL   Creatinine, Ser 4.091.77 (H) 0.44 - 1.00 mg/dL   Calcium 8.9 8.9 - 81.110.3 mg/dL   GFR, Estimated 33 (L) >60 mL/min   Anion gap 7 5 - 15    Comment: Performed at [facility], [address] [place], [place] [address]  CBC     Status: Abnormal   Collection Time: 10/11/20  2:59 AM  Result Value Ref Range   WBC 6.0 4.0 - 10.5 K/uL   RBC 3.47 (L) 3.87 - 5.11 MIL/uL   Hemoglobin 9.4 (L) 12.0 - 15.0 g/dL   HCT 29.528.7 (L) 36 - 46 %   MCV 82.7 80.0 - 100.0 fL   MCH 27.1 26.0 - 34.0 pg   MCHC 32.8 30.0 - 36.0 g/dL   RDW 62.119.3 (H) 30.811.5 -  15.5 %   Platelets 71 (L) 150 - 400 K/uL    Comment: REPEATED TO VERIFY Immature Platelet Fraction may be clinically indicated, consider ordering this additional test [id number] CONSISTENT WITH PREVIOUS RESULT    nRBC 0.3 (H) 0.0 - 0.2 %    Comment: Performed at [facility], [address] [place], [place] [address]  Hepatic function panel  Status: Abnormal   Collection Time: 10/11/20  2:59 AM  Result Value Ref Range   Total Protein 6.8 6.5 - 8.1 g/dL   Albumin 3.3 (L) 3.5 - 5.0 g/dL   AST 638 (H) 15 - 41 U/L   ALT 76 (H) 0 - 44 U/L   Alkaline Phosphatase 175 (H) 38 - 126 U/L   Total Bilirubin 1.1 0.3 - 1.2 mg/dL   Bilirubin, Direct 0.4 (H) 0.0 - 0.2 mg/dL   Indirect Bilirubin 0.7 0.3 - 0.9 mg/dL    Comment: Performed at [facility], [address] [place], [place] [address]  Iron and TIBC     Status: None   Collection Time: 10/11/20  2:59 AM  Result Value Ref Range   Iron 37 28 - 170 ug/dL   TIBC 329 518 - 841 ug/dL   Saturation Ratios 11 10.4 - 31.8 %   UIBC 289 ug/dL    Comment: Performed at [facility], [address] [place], [place] [address]  Ferritin     Status: None   Collection Time: 10/11/20  2:59 AM  Result Value Ref Range   Ferritin 196 11 - 307 ng/mL    Comment: Performed at [facility], [address], [place], [place] [address]  Magnesium     Status: Abnormal   Collection Time: 10/12/20  3:13 AM  Result Value Ref Range   Magnesium 1.6 (L) 1.7 - 2.4 mg/dL    Comment: Performed at [facility], [address] [place], [place] [address]  Comprehensive metabolic panel     Status: Abnormal   Collection Time: 10/12/20  3:13 AM  Result Value Ref Range   Sodium 140 135 - 145 mmol/L   Potassium 3.6 3.5 - 5.1 mmol/L   Chloride 105 98 - 111 mmol/L   CO2 26 22 - 32 mmol/L   Glucose, Bld 132 (H) 70 - 99 mg/dL    Comment: Glucose reference range applies only to samples taken after fasting for at least 8  hours.   BUN 25 (H) 6 - 20 mg/dL   Creatinine, Ser 5.57 (H) 0.44 - 1.00 mg/dL   Calcium 8.9 8.9 - 32.2 mg/dL   Total Protein 7.0 6.5 - 8.1 g/dL   Albumin 3.1 (L) 3.5 - 5.0 g/dL   AST 025 (H) 15 - 41 U/L   ALT 127 (H) 0 - 44 U/L   Alkaline Phosphatase 189 (H) 38 - 126 U/L   Total Bilirubin 0.8 0.3 - 1.2 mg/dL   GFR, Estimated 52 (L) >60 mL/min   Anion gap 9 5 - 15    Comment: Performed at [facility], [address] [place], [place] [address]  CBC     Status: Abnormal   Collection Time: 10/12/20  3:13 AM  Result Value Ref Range   WBC 4.7 4.0 - 10.5 K/uL   RBC 3.07 (L) 3.87 - 5.11 MIL/uL   Hemoglobin 8.3 (L) 12.0 - 15.0 g/dL   HCT 23.7 (L) 36 - 46 %   MCV 84.4 80.0 - 100.0 fL   MCH 27.0 26.0 - 34.0 pg   MCHC 32.0 30.0 - 36.0 g/dL   RDW 62.8 (H) 31.5 - 17.6 %   Platelets 77 (L) 150 - 400 K/uL    Comment: REPEATED TO VERIFY Immature Platelet Fraction may be clinically indicated, consider ordering this additional test [id number] CONSISTENT WITH PREVIOUS RESULT    nRBC 0.0 0.0 - 0.2 %    Comment: Performed  at [facility], [address] [place], [place] [address]  Vitamin B12     Status: None   Collection Time: 10/12/20  3:13 AM  Result Value Ref Range   Vitamin B-12 517 180 - 914 pg/mL    Comment: (NOTE) This assay is not validated for testing neonatal or myeloproliferative syndrome specimens for Vitamin B12 levels. Performed at [facility], [address] [place], [place] [address]   Folate, serum, performed at [facility]     Status: None   Collection Time: 10/12/20  3:13 AM  Result Value Ref Range   Folate 12.0 >5.9 ng/mL    Comment: Performed at [facility], [address] [place], [place] [address]  CK     Status: Abnormal   Collection Time: 10/12/20  3:13 AM  Result Value Ref Range   Total CK 395 (H) 38.0 - 234.0 U/L    Comment: Performed at [facility], [address] [place], [place] [address]     Current Facility-Administered Medications  Medication Dose Route Frequency Provider Last Rate Last Admin   0.9 %  sodium chloride infusion   Intravenous PRN Kathrynn Running, MD 10 mL/hr at 10/12/20 1054 500 mL at 10/12/20 1054   amLODipine (NORVASC) tablet 10 mg  10 mg Oral Daily Wouk, Wilfred Curtis, MD   10 mg at 10/12/20 1012   carvedilol (COREG) tablet 25 mg  25 mg Oral BID WC Agbata, Tochukwu, MD   25 mg at 10/12/20 6222   Chlorhexidine Gluconate Cloth 2 % PADS 6 each  6 each Topical Daily Agbata, Tochukwu, MD   6 each at 10/10/20 1714   hydrALAZINE (APRESOLINE) injection 10 mg  10 mg Intravenous Q6H PRN Agbata, Tochukwu, MD   10 mg at 10/12/20 0515   ibuprofen (ADVIL) tablet 600 mg  600 mg Oral Q6H PRN Kathrynn Running, MD   600 mg at 10/11/20 1728   LORazepam (ATIVAN) tablet 1-4 mg  1-4 mg Oral Q1H PRN Agbata, Tochukwu, MD   1 mg at 10/11/20 0251   Or   LORazepam (ATIVAN) injection 1-4 mg  1-4 mg Intravenous Q1H PRN Agbata, Tochukwu, MD       ondansetron (ZOFRAN) tablet 4 mg  4 mg Oral Q6H PRN Agbata, Tochukwu, MD       Or   ondansetron (ZOFRAN) injection 4 mg  4 mg Intravenous Q6H PRN Agbata, Tochukwu, MD       sodium chloride flush (NS) 0.9 % injection 3 mL  3 mL Intravenous Q12H Agbata, Tochukwu, MD   3 mL at 10/11/20 2241   thiamine tablet 100 mg  100 mg Oral Daily Kathrynn Running, MD   100 mg at 10/12/20 9798   zolpidem (AMBIEN) tablet 5 mg  5 mg Oral QHS PRN Andris Baumann, MD   5 mg at 10/11/20 2358    Musculoskeletal: Strength & Muscle Tone: within normal limits Gait & Station: normal Patient leans: N/A  Psychiatric Specialty Exam: Physical Exam Vitals and nursing note reviewed.  Constitutional:      Appearance: She is well-developed.  HENT:     Head: Normocephalic and atraumatic.  Eyes:     Conjunctiva/sclera: Conjunctivae normal.     Pupils: Pupils are equal, round, and reactive to light.  Cardiovascular:     Heart sounds: Normal heart  sounds.  Pulmonary:     Effort: Pulmonary effort is normal.  Abdominal:     Palpations: Abdomen is soft.  Musculoskeletal:        General: Normal range of motion.     Cervical back: Normal range of motion.  Skin:    General: Skin is warm and dry.  Neurological:     General: No focal deficit present.     Mental Status: She is alert.  Psychiatric:        Attention and Perception: Attention normal.        Mood and Affect: Mood normal.        Speech: Speech normal.        Behavior: Behavior normal.        Thought Content: Thought content normal.        Cognition and Memory: Memory is impaired.        Judgment: Judgment normal.     Review of Systems  Constitutional: Negative.   HENT: Negative.   Eyes: Negative.   Respiratory: Negative.   Cardiovascular: Negative.   Gastrointestinal: Negative.   Musculoskeletal: Negative.   Skin: Negative.   Neurological: Negative.   Psychiatric/Behavioral: Negative.     Blood pressure (!) 152/74, pulse 76, temperature 98.3 F (36.8 C), temperature source Oral, resp. rate 16, height 5\' 3"  (1.6 m), weight 67.5 kg, SpO2 100 %.Body mass index is 26.36 kg/m.  General Appearance: Casual  Eye Contact:  Fair  Speech:  Clear and Coherent  Volume:  Normal  Mood:  Euthymic  Affect:  Congruent  Thought Process:  Coherent  Orientation:  Full (Time, Place, and Person)  Thought Content:  Logical  Suicidal Thoughts:  No  Homicidal Thoughts:  No  Memory:  Immediate;   Fair Recent;   Poor Remote;   Fair  Judgement:  Fair  Insight:  Shallow  Psychomotor Activity:  Normal  Concentration:  Concentration: Fair  Recall:  of Knowledge:  Fair  Language:  Fair  Akathisia:  No  Handed:  Right  AIMS (if indicated):     Assets:  Desire for Improvement Housing Social Support  ADL's:  Intact  Cognition:  WNL  Sleep:        Treatment Plan Summary: Daily contact with patient to assess and evaluate symptoms and progress in treatment,  Medication management and Plan Patient appears to have completed the course of her delirium tremens.  Currently not showing any signs of delirium.  Denies all symptoms of depression.  Affect is euthymic and appropriate.  Patient states she understands she needs to stop drinking.  She was counseled about the dangers of continued drinking and delirium tremens including death.  Patient will be given outpatient referrals to mental health agencies in [facility] for follow-up substance abuse treatment.  At this point she no longer meets commitment criteria.  I have discontinued the IVC.  Patient may be discharged directly from the medicine ward home when medicine feels it is appropriate but does not need to be transferred back to psychiatry.  Disposition: Patient does not meet criteria for psychiatric inpatient admission.  [facility], MD 10/12/2020 2:08 PM

## 2020-10-12 NOTE — Progress Notes (Addendum)
PROGRESS NOTE    Allison [name]  HBZ:[id number] DOB: September 03, 1971 DOA: 10/10/2020 PCP: Oneita Hurt, No     Brief Narrative:   Allison [name] is a 49 y.o. female with medical history significant for depression, alcohol use, suicidal and homicidal ideation who was admitted to the behavioral health unit for stabilization.  Per documentation patient drinks excessive alcohol and had recently lost her job.  Her husband also left her 3 weeks ago and took their 5 daughters with them.  Patient has had suicidal and homicidal ideation since then. Received a call from the psychiatrist because of concerns for possible delirium tremens.  Patient with increased agitation requiring increased doses of lorazepam.  She also received a dose of clonidine.  At the time of my encounter with the patient she was lethargic and unable to provide any history.   Transfer to stepdown/ICU was indicated because patient may need Precedex. I am unable to do a review of systems on this patient due to her mental status Labs show sodium of 136, potassium 3.1, chloride 96, bicarb 26, glucose 131, BUN 36 compared to baseline of 15, creatinine 2.75 above baseline of 0.85, calcium 9.9, alkaline phosphatase 231, albumin 4.6, AST 291, ALT 87, total protein 9.2, white count 4.9, hemoglobin 10.7, hematocrit 32.5, MCV 81.7, RDW 18.5, platelet count 70,000. Respiratory viral panel is negative Urine drug screen is negative   Assessment & Plan:   Principal Problem:   Alcohol withdrawal delirium (HCC) Active Problems:   Hypertension   AKI (acute kidney injury) (HCC)   Thrombocytopenia (HCC)   Alcohol use disorder, severe, dependence (HCC)   Major depressive disorder, recurrent episode, severe (HCC)   Hypokalemia   # Alcohol withdrawal delirium Patient with a history of alcohol abuse admitted to the behavioral health unit for stabilization of her depression with suicidal and homicidal ideation. Patient developed increasing agitation  requiring multiple doses of lorazepam. Admitted to hospitalist service and to ICU given potential need for precedex. Overnight 10/16 low CIWA scores, received ativan x1 but just to help to sleep. Arrived to ED 10/12, per ED report had been "drinking substantially" but unclear timing of last drink. Last 36 hours no need for ativan, current ciwa is 0 - cont daily thiamine  # Myalgia This morning reports b/l calf soreness. No swelling on exam or erythema/warmth but both are tender. PVL neg for DVT. CK mildly elevated to 400s - etiology unclear, no report of extended immobility, perhaps associated w/ report of agitated behavior, also did have aki - resume NS @ 125 - repeat CK in AM  # Acute kidney injury  Baseline creatinine of normal, peaked at 2.75 this admission, likely 2/2 decreased po. Improved to 1.23 this AM. - fluids as above - monitor  # Anemia - likely 2/2 chronic disease, w/u at recent hospitalization suggestive of such, fecal occult blood reported to be neg that admission. Here h 8.3 today from 9.4 yesterday, probably dilution as pt reports no melena or hematochezia, 4 years since last menstruation. - will check b12/folate  # Alcoholic liver disease  With markedly elevated ast to alt ratio, thrombocytopenia. Steatosis seen on liver u/s earlier this year. Appears stable. Negative hepatitis serologies earlier this year. 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 Admitting provider discussed with psychiatrist,  Dr Toni Amendlapacs patient will need one-to-one watch for safety/fall precautions. IVC has been rescinded by psych - psychiatry consulted may return to their service vs discharge w/ outpt f/u  # Hypokalemia Resolved, mg still low at 1.6 - 2 mg IV mg    DVT prophylaxis: SCDs given thrombocytopenia Code Status: full Family Communication: attempted to update sister telephonically 10/17  Status is: Inpatient  Remains inpatient appropriate because:Inpatient level of care appropriate due to severity of illness   Dispo: The patient is from: Home              Anticipated d/c is to: psychiatry service              Anticipated d/c date is: 0-1 days              Patient currently is not medically stable to d/c.      Consultants:  psychiatry  Procedures: none  Antimicrobials:  none    Subjective: This morning alert, complaining of b/l soreness in legs. No tremor or diaphoresis, has appetite.  Objective: Vitals:   10/12/20 0010 10/12/20 0353 10/12/20 0830 10/12/20 0938  BP: (!) 174/92 (S) (!) 177/91 (!) 177/93 (!) 189/89  Pulse: 61 71 83   Resp: 15 17 16    Temp: 98.5 F (36.9 C) 98.5 F (36.9 C) 98.3 F (36.8 C)   TempSrc: Oral Oral Oral   SpO2: 100% 100% 100%   Weight:      Height:        Intake/Output Summary (Last 24 hours) at 10/12/2020 1008 Last data filed at 10/11/2020 2241 Gross per 24 hour  Intake 821.05 ml  Output --  Net 821.05 ml   Filed Weights   10/10/20 1612  Weight: 67.5 kg    Examination:  General exam: Appears calm and comfortable. Sleepy but arousable Respiratory system: Clear to auscultation. Respiratory effort normal. Cardiovascular system: S1 & S2 heard, moderate systolic murmur Gastrointestinal system: Abdomen is nondistended, soft and nontender. No organomegaly or masses felt. Normal bowel sounds heard. Central nervous system: Alert and oriented. No focal neurological deficits. Extremities: Symmetric 5 x 5 power. Skin: No rashes, lesions  or ulcers Psychiatry: Judgement and insight appear normal. Mood & affect appropriate.     Data Reviewed: I have personally reviewed following labs and imaging studies  CBC: Recent Labs  Lab 10/06/20 2240 10/09/20 1419 10/11/20 0259 10/12/20 0313  WBC 3.3* 4.9 6.0 4.7  HGB 11.1* 10.7* 9.4* 8.3*  HCT 34.1* 32.5* 28.7* 25.9*  MCV 82.6 81.7 82.7 84.4  PLT 106* 70* 71* 77*   Basic Metabolic Panel: Recent Labs  Lab 10/06/20 2240 10/09/20 1419 10/10/20 1314 10/11/20 0259 10/12/20 0313  NA 143 133* 136 137 140  K 2.8* 3.9 3.1* 3.2* 3.6  CL 100 97* 96* 101 105  CO2 29 22 26 29 26   GLUCOSE 135* 224* 131* 215* 132*  BUN 6 15 36* 29* 25*  CREATININE 0.85 1.93* 2.75* 1.77* 1.23*  CALCIUM 8.2* 8.4* 9.9 8.9 8.9  MG  --  1.1* 1.3*  --  1.6*  PHOS  --  2.2* 1.5*  --   --    GFR: Estimated Creatinine Clearance: 51.6 mL/min (A) (by C-G formula based on SCr of 1.23 mg/dL (H)). Liver  Function Tests: Recent Labs  Lab 10/06/20 2240 10/09/20 1419 10/10/20 1314 10/11/20 0259 10/12/20 0313  AST 250* 299* 291* 267* 360*  ALT 66* 73* 87* 76* 127*  ALKPHOS 198* 216* 231* 175* 189*  BILITOT 1.1 1.7* 1.7* 1.1 0.8  PROT 7.9 8.0 9.2* 6.8 7.0  ALBUMIN 3.9 3.6 4.6 3.3* 3.1*   No results for input(s): LIPASE, AMYLASE in the last 168 hours. No results for input(s): AMMONIA in the last 168 hours. Coagulation Profile: No results for input(s): INR, PROTIME in the last 168 hours. Cardiac Enzymes: No results for input(s): CKTOTAL, CKMB, CKMBINDEX, TROPONINI in the last 168 hours. BNP (last 3 results) No results for input(s): PROBNP in the last 8760 hours. HbA1C: No results for input(s): HGBA1C in the last 72 hours. CBG: No results for input(s): GLUCAP in the last 168 hours. Lipid Profile: No results for input(s): CHOL, HDL, LDLCALC, TRIG, CHOLHDL, LDLDIRECT in the last 72 hours. Thyroid Function Tests: No results for input(s): TSH, T4TOTAL, FREET4, T3FREE, THYROIDAB in the last 72  hours. Anemia Panel: Recent Labs    10/11/20 0259  FERRITIN 196  TIBC 326  IRON 37   Urine analysis:    Component Value Date/Time   COLORURINE YELLOW 06/07/2020 0222   APPEARANCEUR HAZY (A) 06/07/2020 0222   LABSPEC >1.046 (H) 06/07/2020 0222   PHURINE 6.0 06/07/2020 0222   GLUCOSEU NEGATIVE 06/07/2020 0222   HGBUR SMALL (A) 06/07/2020 0222   BILIRUBINUR NEGATIVE 06/07/2020 0222   KETONESUR 20 (A) 06/07/2020 0222   PROTEINUR 100 (A) 06/07/2020 0222   UROBILINOGEN 0.2 03/18/2008 0140   NITRITE POSITIVE (A) 06/07/2020 0222   LEUKOCYTESUR NEGATIVE 06/07/2020 0222   Sepsis Labs: @LABRCNTIP (procalcitonin:4,lacticidven:4)  ) Recent Results (from the past 240 hour(s))  Respiratory Panel by RT PCR (Flu A&B, Covid) - Nasopharyngeal Swab     Status: None   Collection Time: 10/07/20  9:11 AM   Specimen: Nasopharyngeal Swab  Result Value Ref Range Status   SARS Coronavirus 2 by RT PCR NEGATIVE NEGATIVE Final    Comment: (NOTE) SARS-CoV-2 target nucleic acids are NOT DETECTED.  The SARS-CoV-2 RNA is generally detectable in upper respiratoy specimens during the acute phase of infection. The lowest concentration of SARS-CoV-2 viral copies this assay can detect is 131 copies/mL. A negative result does not preclude SARS-Cov-2 infection and should not be used as the sole basis for treatment or other patient management decisions. A negative result may occur with  improper specimen collection/handling, submission of specimen other than nasopharyngeal swab, presence of viral mutation(s) within the areas targeted by this assay, and inadequate number of viral copies (<131 copies/mL). A negative result must be combined with clinical observations, patient history, and epidemiological information. The expected result is Negative.  Fact Sheet for Patients:  10/09/20  Fact Sheet for Healthcare Providers:  https://www.moore.com/  This  test is no t yet approved or cleared by the https://www.young.biz/ FDA and  has been authorized for detection and/or diagnosis of SARS-CoV-2 by FDA under an Emergency Use Authorization (EUA). This EUA will remain  in effect (meaning this test can be used) for the duration of the COVID-19 declaration under Section 564(b)(1) of the Act, 21 U.S.C. section 360bbb-3(b)(1), unless the authorization is terminated or revoked sooner.     Influenza A by PCR NEGATIVE NEGATIVE Final   Influenza B by PCR NEGATIVE NEGATIVE Final    Comment: (NOTE) The Xpert Xpress SARS-CoV-2/FLU/RSV assay is intended as an aid in  the diagnosis of influenza from  Nasopharyngeal swab specimens and  should not be used as a sole basis for treatment. Nasal washings and  aspirates are unacceptable for Xpert Xpress SARS-CoV-2/FLU/RSV  testing.  Fact Sheet for Patients: https://www.moore.com/  Fact Sheet for Healthcare Providers: https://www.young.biz/  This test is not yet approved or cleared by the [place] FDA and  has been authorized for detection and/or diagnosis of SARS-CoV-2 by  FDA under an Emergency Use Authorization (EUA). This EUA will remain  in effect (meaning this test can be used) for the duration of the  Covid-19 declaration under Section 564(b)(1) of the Act, 21  U.S.C. section 360bbb-3(b)(1), unless the authorization is  terminated or revoked. Performed at [facility], [address] [place], [place] [address]   MRSA PCR Screening     Status: None   Collection Time: 10/10/20  4:13 PM   Specimen: Nasal Mucosa; Nasopharyngeal  Result Value Ref Range Status   MRSA by PCR NEGATIVE NEGATIVE Final    Comment:        The GeneXpert MRSA Assay (FDA approved for NASAL specimens only), is one component of a comprehensive MRSA colonization surveillance program. It is not intended to diagnose MRSA infection nor to guide or monitor treatment  for MRSA infections. Performed at [facility], [address] [place], [place] [address]          Radiology Studies: No results found.      Scheduled Meds: . amLODipine  10 mg Oral Daily  . carvedilol  25 mg Oral BID WC  . Chlorhexidine Gluconate Cloth  6 each Topical Daily  . sodium chloride flush  3 mL Intravenous Q12H  . thiamine  100 mg Oral Daily   Continuous Infusions: . magnesium sulfate bolus IVPB       LOS: 2 days    Time spent: 25 min    Silvano Bilis, MD [facility]   If 7PM-7AM, please contact night-coverage [email or web address] Password [email or web address] 10/12/2020, 10:08 AM

## 2020-10-12 NOTE — Progress Notes (Signed)
Pt administered PRN dose of Ambien; medication was slightly effective.  Pt feels that she will need something stronger.

## 2020-10-13 LAB — CBC
HCT: 25.6 % — ABNORMAL LOW (ref 36.0–46.0)
Hemoglobin: 8.4 g/dL — ABNORMAL LOW (ref 12.0–15.0)
MCH: 28 pg (ref 26.0–34.0)
MCHC: 32.8 g/dL (ref 30.0–36.0)
MCV: 85.3 fL (ref 80.0–100.0)
Platelets: 96 10*3/uL — ABNORMAL LOW (ref 150–400)
RBC: 3 MIL/uL — ABNORMAL LOW (ref 3.87–5.11)
RDW: 20.4 % — ABNORMAL HIGH (ref 11.5–15.5)
WBC: 4.2 10*3/uL (ref 4.0–10.5)
nRBC: 0 % (ref 0.0–0.2)

## 2020-10-13 LAB — COMPREHENSIVE METABOLIC PANEL
ALT: 132 U/L — ABNORMAL HIGH (ref 0–44)
AST: 258 U/L — ABNORMAL HIGH (ref 15–41)
Albumin: 3 g/dL — ABNORMAL LOW (ref 3.5–5.0)
Alkaline Phosphatase: 174 U/L — ABNORMAL HIGH (ref 38–126)
Anion gap: 9 (ref 5–15)
BUN: 16 mg/dL (ref 6–20)
CO2: 23 mmol/L (ref 22–32)
Calcium: 9 mg/dL (ref 8.9–10.3)
Chloride: 106 mmol/L (ref 98–111)
Creatinine, Ser: 0.87 mg/dL (ref 0.44–1.00)
GFR, Estimated: >60 mL/min (ref >60)
Glucose, Bld: 152 mg/dL — ABNORMAL HIGH (ref 70–99)
Potassium: 3.4 mmol/L — ABNORMAL LOW (ref 3.5–5.1)
Sodium: 138 mmol/L (ref 135–145)
Total Bilirubin: 0.6 mg/dL (ref 0.3–1.2)
Total Protein: 6.4 g/dL — ABNORMAL LOW (ref 6.5–8.1)

## 2020-10-13 LAB — TSH: TSH: 3.517 u[IU]/mL (ref 0.350–4.500)

## 2020-10-13 LAB — MAGNESIUM: Magnesium: 1.4 mg/dL — ABNORMAL LOW (ref 1.7–2.4)

## 2020-10-13 LAB — CK: Total CK: 407 U/L — ABNORMAL HIGH (ref 38–234)

## 2020-10-13 NOTE — Discharge Summary (Signed)
Basil Dessracy R Nabozny ZOX:[id number] DOB: 1971-10-19 DOA: 10/10/2020  PCP: Pcp, No  Admit date: 10/10/2020 Discharge date: 10/13/2020  Time spent: 35 minutes  Recommendations for Outpatient Follow-up:  1. Will need behavioral health f/u ([facility] for [facility] 2. PCP f/u of mildly elevated CK 3. Consider outpatient TTE to evaluate moderate systolic murmur    Discharge Diagnoses:  Principal Problem:   Alcohol withdrawal delirium (HCC) Active Problems:   Hypertension   AKI (acute kidney injury) (HCC)   Thrombocytopenia (HCC)   Alcohol use disorder, severe, dependence (HCC)   Major depressive disorder, recurrent episode, severe (HCC)   Hypokalemia   Discharge Condition: good  Diet recommendation: regular  Filed Weights   10/10/20 1612  Weight: 67.5 kg    History of present illness:  Allison [name] a 49 y.o.femalewith medical history significant fordepression, alcohol use, suicidal and homicidal ideation who was admitted to the behavioral health unit for stabilization. Per documentation patient drinks excessive alcohol and had recently lost her job. Her husband also left her 3 weeks ago and took their 5 daughters with them. Patient has had suicidal and homicidal ideation since then. Received a call from the psychiatrist because of concerns for possible delirium tremens. Patient with increased agitation requiring increased doses of lorazepam. She also received a dose of clonidine. At the time of my encounter with the patient she was lethargic and unable to provide any history.  Transfer to stepdown/ICU was indicated because patient may need Precedex. I am unable to do a review of systems on this patient due to her mental status Labs show sodium of 136, potassium 3.1, chloride 96, bicarb 26, glucose 131, BUN 36 compared to baseline of 15, creatinine 2.75 above baseline of 0.85, calcium 9.9, alkaline phosphatase 231, albumin 4.6, AST  291, ALT 87, total protein 9.2, white count 4.9, hemoglobin 10.7, hematocrit 32.5, MCV 81.7, RDW 18.5, platelet count 70,000. Respiratory viral panel is negative Urine drug screen is negative  Hospital Course:   # Alcohol withdrawal delirium Patient with a history of alcohol abuse admitted to the behavioral health unit for stabilization of her depression with suicidal and homicidal ideation. Patient developed increasing agitation requiring multiple doses of lorazepam. Admitted to hospitalist service and to ICU given potential need for precedex. Overnight 10/16 low CIWA scores and no scores since, in essence no withdrawal symptoms while on hospitalist service - outpt substance abuse treatment, pt plans to resume services @ [facility]  # Myalgia AM 10/18 complaining of b/l calf soreness. Resolved day of discharge. PVLs neg for dvt but ck mildly elevated to 400, unchanged on repeat day of discharge. Myalgias resolvd - will need outpt repeat of CK, further eval if remains elevated or symptoms of myopathy develop  # Acute kidney injury  Resolved w/ fluids  # Anemia - stable, labs suggestive of chronic disease. B12/folate wnl  # Alcoholic liver disease  With markedly elevated ast to alt ratio, thrombocytopenia. Steatosis seen on liver u/s earlier this year. Appears stable. Negative hepatitis serologies earlier this year. - I have counseled patient regarding imperative of strict alcohol cessation  - GI referral  # Hypertension Will resume home meds at d/c  # Cardiac murmur - moderate, no s/s volume overload - will need outpt echocardiogram  # Major depression with suicidal ideation psychotic features Patient admitted to the behavioral health unit for stabilization of her major depression with suicidal and homicidal ideations. Admitting provider discussed with psychiatrist, Dr Luci Banklapacspatient will need  one-to-one watch for safety/fall precautions. IVC has been rescinded by psych -  psychiatry advises outpt substance abuse treatment  # Hypokalemia Resolved  Procedures:  none   Consultations:  psychiatry  Discharge Exam: Vitals:   10/13/20 0441 10/13/20 1149  BP: (!) 164/82 (!) 144/91  Pulse: 65 69  Resp: 16 17  Temp: 98.6 F (37 C) 98.3 F (36.8 C)  SpO2: 97% 100%    General exam: Appears calm and comfortable. Sleepy but arousable Respiratory system: Clear to auscultation. Respiratory effort normal. Cardiovascular system: S1 & S2 heard, moderate systolic murmur Gastrointestinal system: Abdomen is nondistended, soft and nontender. No organomegaly or masses felt. Normal bowel sounds heard. Central nervous system: Alert and oriented. No focal neurological deficits. Extremities: Symmetric 5 x 5 power. No muscle tenderness Skin: No rashes, lesions or ulcers Psychiatry: Judgement and insight appear normal. Mood & affect appropriate.   Discharge Instructions   Discharge Instructions    Ambulatory referral to Gastroenterology   Complete by: As directed    What is the reason for referral?: Other Comment - alcoholic liver disease   Call MD for:  difficulty breathing, headache or visual disturbances   Complete by: As directed    Call MD for:  extreme fatigue   Complete by: As directed    Call MD for:  hives   Complete by: As directed    Call MD for:  persistant dizziness or light-headedness   Complete by: As directed    Call MD for:  persistant nausea and vomiting   Complete by: As directed    Call MD for:  redness, tenderness, or signs of infection (pain, swelling, redness, odor or green/yellow discharge around incision site)   Complete by: As directed    Call MD for:  severe uncontrolled pain   Complete by: As directed    Call MD for:  temperature >100.4   Complete by: As directed    Diet general   Complete by: As directed    Increase activity slowly   Complete by: As directed      Allergies as of 10/13/2020      Reactions   Lisinopril  Cough      Medication List    STOP taking these medications   chlordiazePOXIDE 25 MG capsule Commonly known as: LIBRIUM     TAKE these medications   carvedilol 25 MG tablet Commonly known as: COREG Take 1 tablet (25 mg total) by mouth 2 (two) times daily with a meal.   folic acid 1 MG tablet Commonly known as: FOLVITE Take 1 tablet (1 mg total) by mouth daily.   losartan 50 MG tablet Commonly known as: COZAAR Take 1 tablet (50 mg total) by mouth daily.   thiamine 100 MG tablet Commonly known as: Vitamin B-1 Take 1 tablet (100 mg total) by mouth daily.      Allergies  Allergen Reactions  . Lisinopril Cough      The results of significant diagnostics from this hospitalization (including imaging, microbiology, ancillary and laboratory) are listed below for reference.    Significant Diagnostic Studies: US Venous Img Lower Bilateral (DVT)  Result Date: 10/12/2020 CLINICAL DATA:  Bilateral calf tenderness. EXAM: BILATERAL LOWER EXTREMITY VENOUS DOPPLER ULTRASOUND TECHNIQUE: Gray-scale sonography with compression, as well as color and duplex ultrasound, were performed to evaluate the deep venous system(s) from the level of the common femoral vein through the popliteal and proximal calf veins. COMPARISON:  None. FINDINGS: VENOUS Normal compressibility of the bilateral common femoral, superficial  femoral, and popliteal veins, as well as the visualized calf veins. Visualized portions of the bilateral profunda femoral veins and great saphenous veins unremarkable. No filling defects to suggest DVT on grayscale or color Doppler imaging. Doppler waveforms show normal direction of venous flow, normal respiratory plasticity and response to augmentation. OTHER None. Limitations: None. IMPRESSION: Negative. Electronically Signed   By: Obie Dredge M.D.   On: 10/12/2020 10:47    Microbiology: Recent Results (from the past 240 hour(s))  Respiratory Panel by RT PCR (Flu A&B, Covid) -  Nasopharyngeal Swab     Status: None   Collection Time: 10/07/20  9:11 AM   Specimen: Nasopharyngeal Swab  Result Value Ref Range Status   SARS Coronavirus 2 by RT PCR NEGATIVE NEGATIVE Final    Comment: (NOTE) SARS-CoV-2 target nucleic acids are NOT DETECTED.  The SARS-CoV-2 RNA is generally detectable in upper respiratoy specimens during the acute phase of infection. The lowest concentration of SARS-CoV-2 viral copies this assay can detect is 131 copies/mL. A negative result does not preclude SARS-Cov-2 infection and should not be used as the sole basis for treatment or other patient management decisions. A negative result may occur with  improper specimen collection/handling, submission of specimen other than nasopharyngeal swab, presence of viral mutation(s) within the areas targeted by this assay, and inadequate number of viral copies (<131 copies/mL). A negative result must be combined with clinical observations, patient history, and epidemiological information. The expected result is Negative.  Fact Sheet for Patients:  https://www.moore.com/  Fact Sheet for Healthcare Providers:  https://www.young.biz/  This test is no t yet approved or cleared by the [place] FDA and  has been authorized for detection and/or diagnosis of SARS-CoV-2 by FDA under an Emergency Use Authorization (EUA). This EUA will remain  in effect (meaning this test can be used) for the duration of the COVID-19 declaration under Section 564(b)(1) of the Act, 21 U.S.C. section 360bbb-3(b)(1), unless the authorization is terminated or revoked sooner.     Influenza A by PCR NEGATIVE NEGATIVE Final   Influenza B by PCR NEGATIVE NEGATIVE Final    Comment: (NOTE) The Xpert Xpress SARS-CoV-2/FLU/RSV assay is intended as an aid in  the diagnosis of influenza from Nasopharyngeal swab specimens and  should not be used as a sole basis for treatment. Nasal washings and   aspirates are unacceptable for Xpert Xpress SARS-CoV-2/FLU/RSV  testing.  Fact Sheet for Patients: https://www.moore.com/  Fact Sheet for Healthcare Providers: https://www.young.biz/  This test is not yet approved or cleared by the [place] FDA and  has been authorized for detection and/or diagnosis of SARS-CoV-2 by  FDA under an Emergency Use Authorization (EUA). This EUA will remain  in effect (meaning this test can be used) for the duration of the  Covid-19 declaration under Section 564(b)(1) of the Act, 21  U.S.C. section 360bbb-3(b)(1), unless the authorization is  terminated or revoked. Performed at [facility], [address] [place], [place] [address]   MRSA PCR Screening     Status: None   Collection Time: 10/10/20  4:13 PM   Specimen: Nasal Mucosa; Nasopharyngeal  Result Value Ref Range Status   MRSA by PCR NEGATIVE NEGATIVE Final    Comment:        The GeneXpert MRSA Assay (FDA approved for NASAL specimens only), is one component of a comprehensive MRSA colonization surveillance program. It is not intended to diagnose MRSA infection nor to guide or monitor treatment for MRSA infections. Performed at  [facility], [address] [place], [place] [address]      Labs: Basic Metabolic Panel: Recent Labs  Lab 10/09/20 1419 10/10/20 1314 10/11/20 0259 10/12/20 0313 10/13/20 0450  NA 133* 136 137 140 138  K 3.9 3.1* 3.2* 3.6 3.4*  CL 97* 96* 101 105 106  CO2 22 26 29 26 23   GLUCOSE 224* 131* 215* 132* 152*  BUN 15 36* 29* 25* 16  CREATININE 1.93* 2.75* 1.77* 1.23* 0.87  CALCIUM 8.4* 9.9 8.9 8.9 9.0  MG 1.1* 1.3*  --  1.6* 1.4*  PHOS 2.2* 1.5*  --   --   --    Liver Function Tests: Recent Labs  Lab 10/09/20 1419 10/10/20 1314 10/11/20 0259 10/12/20 0313 10/13/20 0450  AST 299* 291* 267* 360* 258*  ALT 73* 87* 76* 127* 132*  ALKPHOS 216* 231* 175* 189* 174*  BILITOT 1.7*  1.7* 1.1 0.8 0.6  PROT 8.0 9.2* 6.8 7.0 6.4*  ALBUMIN 3.6 4.6 3.3* 3.1* 3.0*   No results for input(s): LIPASE, AMYLASE in the last 168 hours. No results for input(s): AMMONIA in the last 168 hours. CBC: Recent Labs  Lab 10/06/20 2240 10/09/20 1419 10/11/20 0259 10/12/20 0313 10/13/20 0450  WBC 3.3* 4.9 6.0 4.7 4.2  HGB 11.1* 10.7* 9.4* 8.3* 8.4*  HCT 34.1* 32.5* 28.7* 25.9* 25.6*  MCV 82.6 81.7 82.7 84.4 85.3  PLT 106* 70* 71* 77* 96*   Cardiac Enzymes: Recent Labs  Lab 10/12/20 0313 10/13/20 0450  CKTOTAL 395* 407*   BNP: BNP (last 3 results) No results for input(s): BNP in the last 8760 hours.  ProBNP (last 3 results) No results for input(s): PROBNP in the last 8760 hours.  CBG: No results for input(s): GLUCAP in the last 168 hours.     Signed:  10/15/20 MD.  [facility] 10/13/2020, 12:18 PM

## 2020-10-13 NOTE — Progress Notes (Signed)
Patient discharged to home.  Left unit via wheelchair with all her belongings, escorted by volunteer.

## 2020-10-14 ENCOUNTER — Telehealth: Payer: Self-pay | Admitting: Obstetrics and Gynecology

## 2020-10-14 DIAGNOSIS — K709 Alcoholic liver disease, unspecified: Secondary | ICD-10-CM

## 2020-10-14 NOTE — Telephone Encounter (Signed)
Pt requests gi referral to somewhere in [place], making that referral

## 2020-11-30 ENCOUNTER — Ambulatory Visit: Payer: 59 | Admitting: Obstetrics

## 2020-12-11 IMAGING — US US EXTREM LOW VENOUS
1 series · 14 of 24 positions shown · non-contrast
Comparison: None.

CLINICAL DATA: Bilateral calf tenderness.

EXAM:
BILATERAL LOWER EXTREMITY VENOUS DOPPLER ULTRASOUND
TECHNIQUE: Gray-scale sonography with compression, as well as color and duplex
ultrasound, were performed to evaluate the deep venous system(s)
from the level of the common femoral vein through the popliteal and
proximal calf veins.

[Series 1: us venous img lower bilat (dvt) · portal-venous · 14 of 50 slices shown]
[im 1/50]
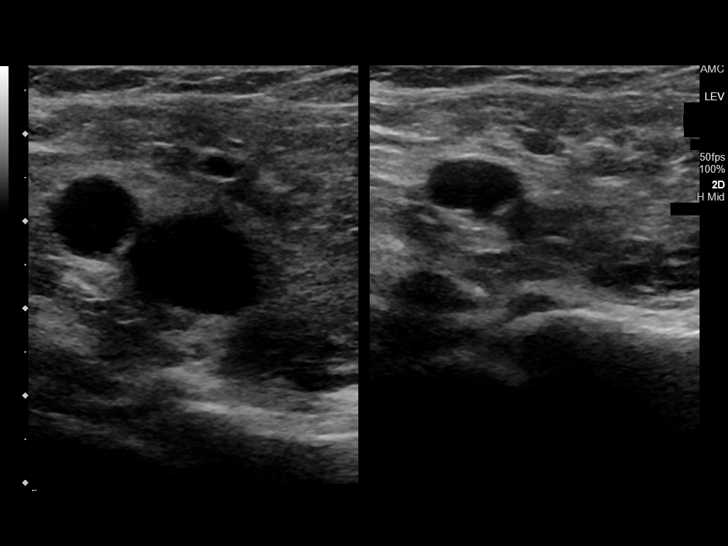
[im 5/50]
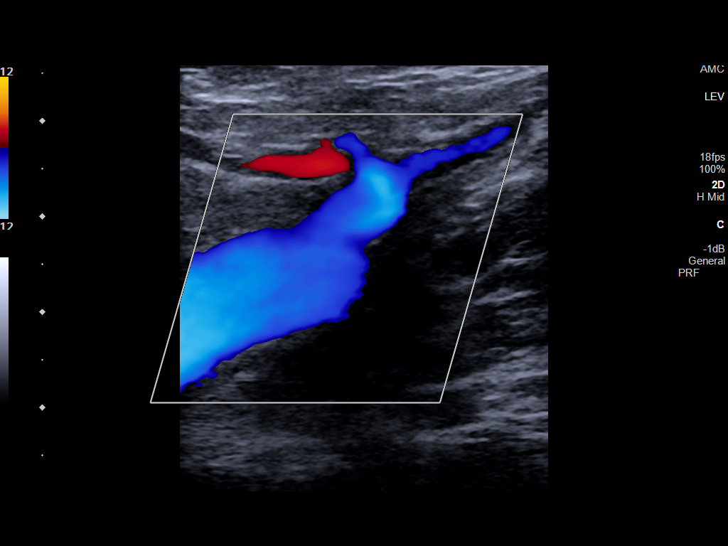
[im 9/50]
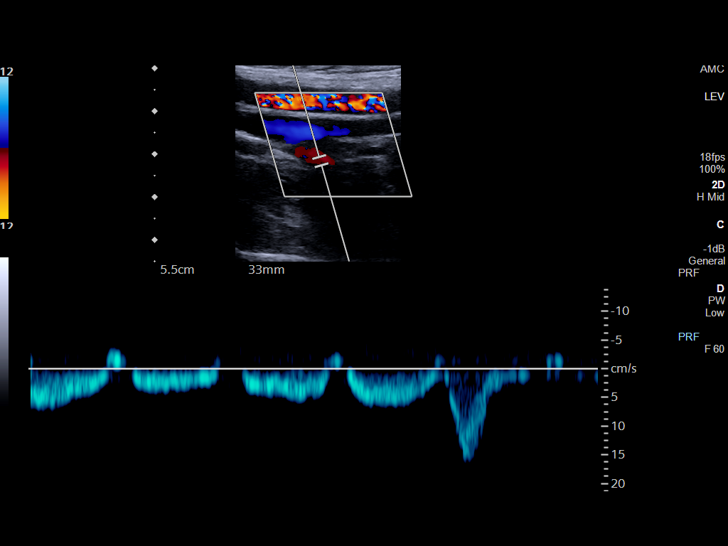
[im 13/50]
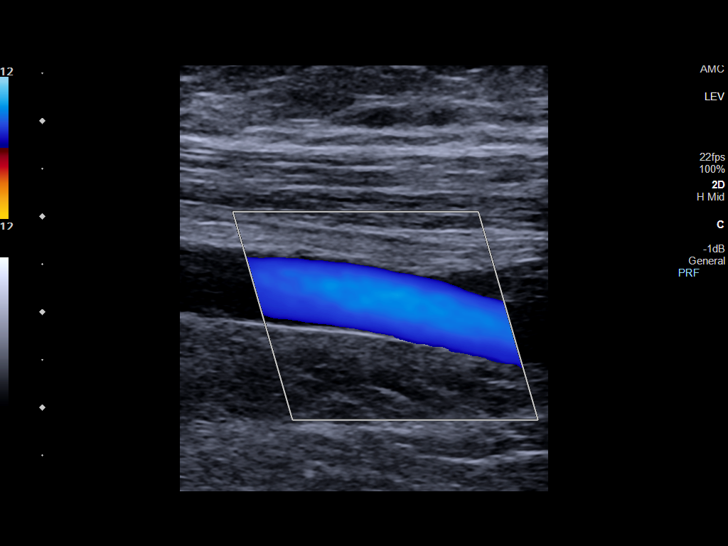
[im 15/50]
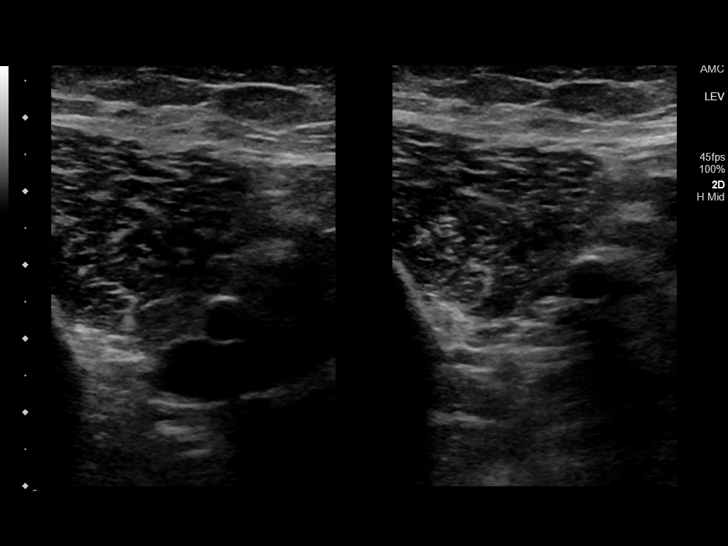
[im 20/50]
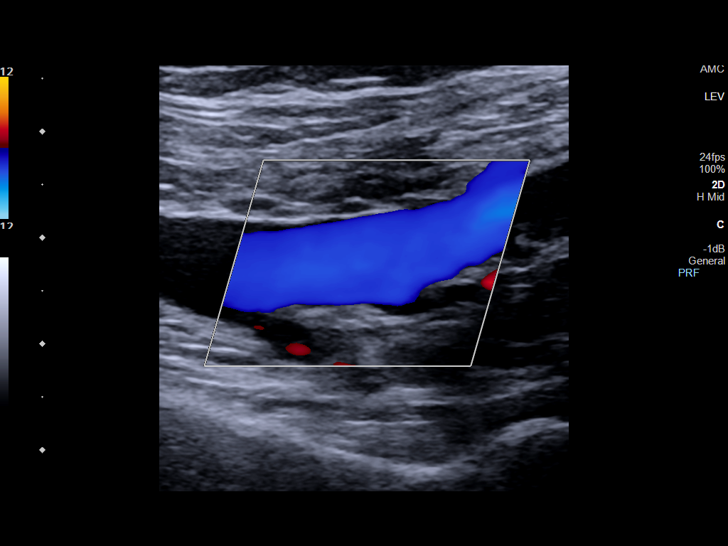
[im 24/50]
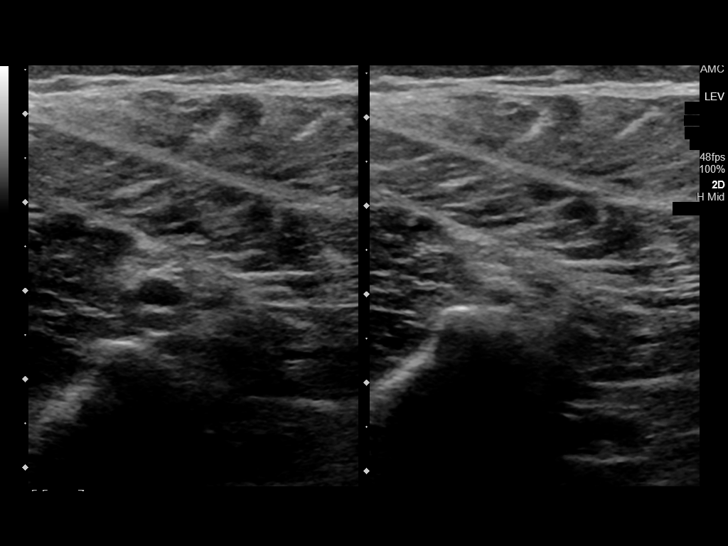
[im 26/50]
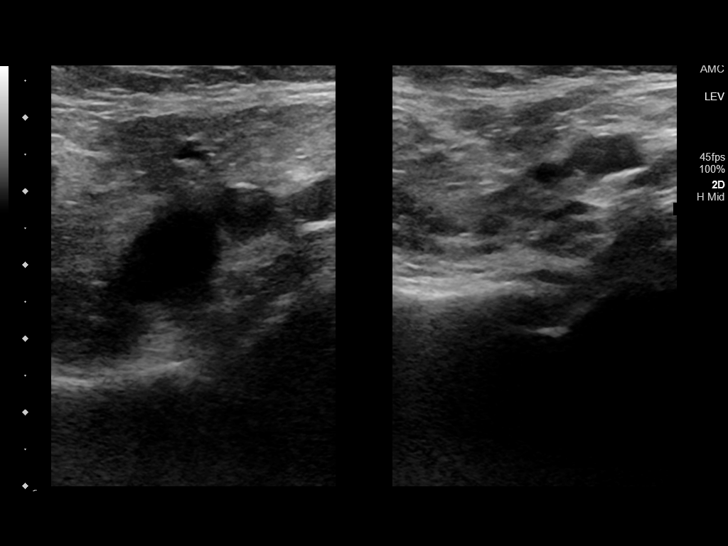
[im 30/50]
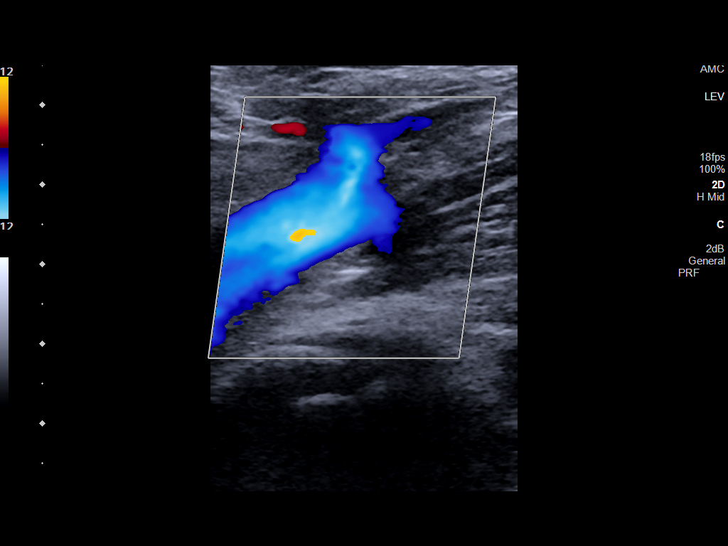
[im 35/50]
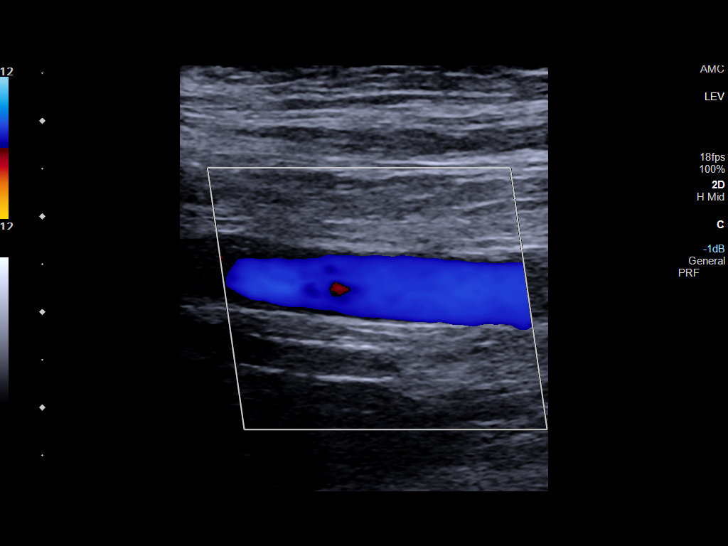
[im 39/50]
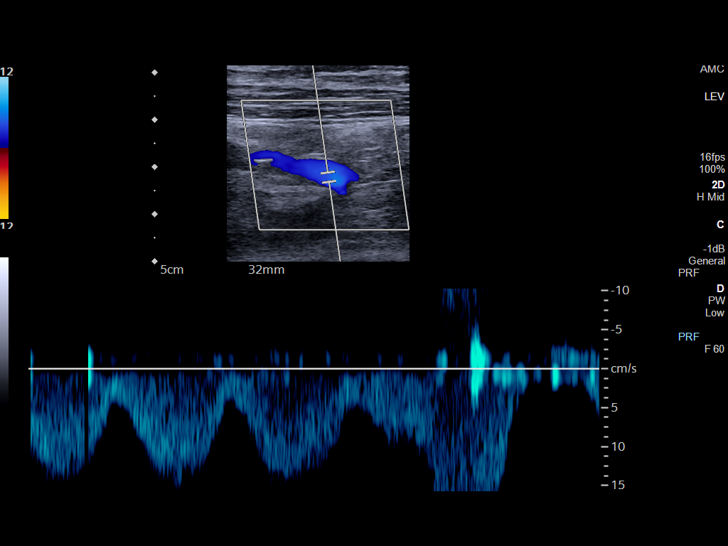
[im 41/50]
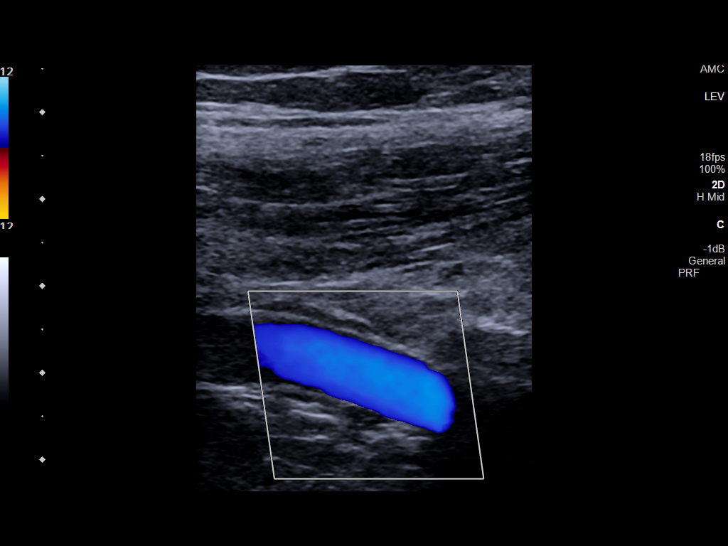
[im 45/50]
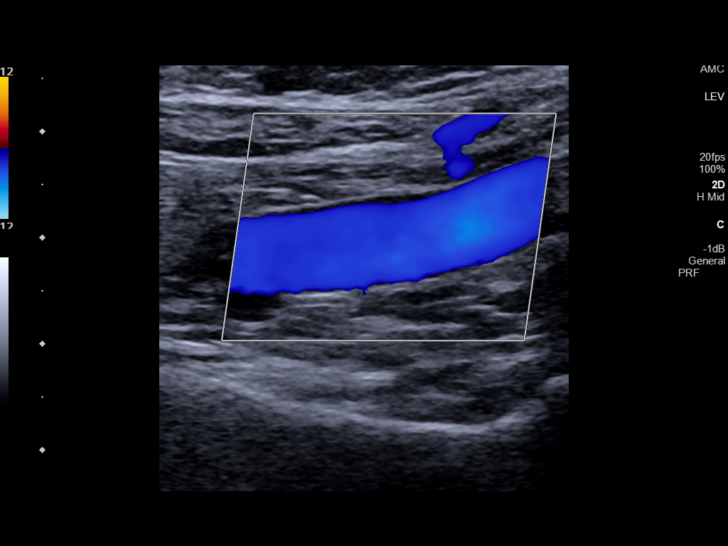
[im 50/50]
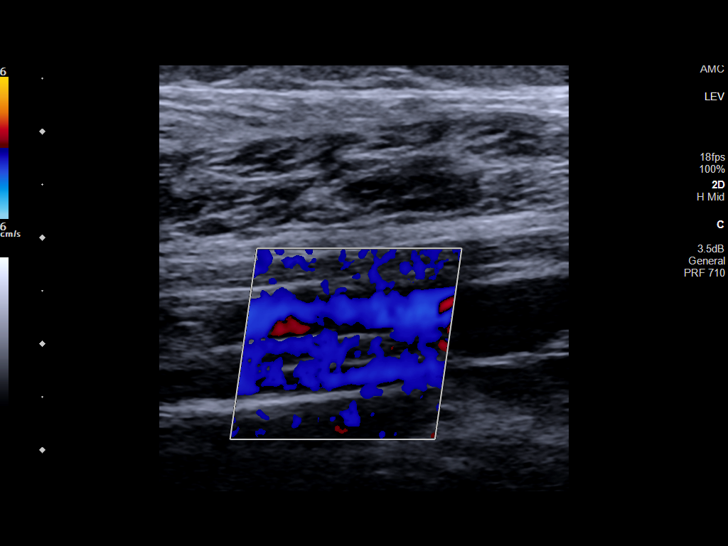

[14 of 24 positions shown; findings below may reference images not displayed]

FINDINGS: VENOUS

Normal compressibility of the bilateral common femoral, superficial
femoral, and popliteal veins, as well as the visualized calf veins.
Visualized portions of the bilateral profunda femoral veins and
great saphenous veins unremarkable. No filling defects to suggest
DVT on grayscale or color Doppler imaging. Doppler waveforms show
normal direction of venous flow, normal respiratory plasticity and
response to augmentation.

OTHER

None.

Limitations: None.
IMPRESSION: Negative.

## 2020-12-30 ENCOUNTER — Ambulatory Visit: Payer: 59 | Admitting: Obstetrics

## 2021-01-13 ENCOUNTER — Ambulatory Visit: Payer: 59 | Admitting: Internal Medicine

## 2021-02-25 ENCOUNTER — Encounter: Payer: Self-pay | Admitting: Internal Medicine

## 2021-02-25 ENCOUNTER — Other Ambulatory Visit (INDEPENDENT_AMBULATORY_CARE_PROVIDER_SITE_OTHER): Payer: Medicaid Other

## 2021-02-25 ENCOUNTER — Ambulatory Visit: Payer: Medicaid Other | Admitting: Internal Medicine

## 2021-02-25 VITALS — BP 168/72 | HR 66 | Ht 63.0 in | Wt 176.0 lb

## 2021-02-25 DIAGNOSIS — R945 Abnormal results of liver function studies: Secondary | ICD-10-CM

## 2021-02-25 DIAGNOSIS — Z800 Family history of malignant neoplasm of digestive organs: Secondary | ICD-10-CM | POA: Diagnosis not present

## 2021-02-25 DIAGNOSIS — K219 Gastro-esophageal reflux disease without esophagitis: Secondary | ICD-10-CM

## 2021-02-25 DIAGNOSIS — R7989 Other specified abnormal findings of blood chemistry: Secondary | ICD-10-CM

## 2021-02-25 DIAGNOSIS — K7010 Alcoholic hepatitis without ascites: Secondary | ICD-10-CM

## 2021-02-25 LAB — CBC WITH DIFFERENTIAL/PLATELET
Basophils Absolute: 0 10*3/uL (ref 0.0–0.1)
Basophils Relative: 0.6 % (ref 0.0–3.0)
Eosinophils Absolute: 0.2 10*3/uL (ref 0.0–0.7)
Eosinophils Relative: 3.6 % (ref 0.0–5.0)
HCT: 35.4 % — ABNORMAL LOW (ref 36.0–46.0)
Hemoglobin: 11.3 g/dL — ABNORMAL LOW (ref 12.0–15.0)
Lymphocytes Relative: 24.2 % (ref 12.0–46.0)
Lymphs Abs: 1.6 10*3/uL (ref 0.7–4.0)
MCHC: 32 g/dL (ref 30.0–36.0)
MCV: 76.5 fl — ABNORMAL LOW (ref 78.0–100.0)
Monocytes Absolute: 0.4 10*3/uL (ref 0.1–1.0)
Monocytes Relative: 6.6 % (ref 3.0–12.0)
Neutro Abs: 4.3 10*3/uL (ref 1.4–7.7)
Neutrophils Relative %: 65 % (ref 43.0–77.0)
Platelets: 178 10*3/uL (ref 150.0–400.0)
RBC: 4.62 Mil/uL (ref 3.87–5.11)
RDW: 16.9 % — ABNORMAL HIGH (ref 11.5–15.5)
WBC: 6.6 10*3/uL (ref 4.0–10.5)

## 2021-02-25 LAB — COMPREHENSIVE METABOLIC PANEL
ALT: 18 U/L (ref 0–35)
AST: 28 U/L (ref 0–37)
Albumin: 3.7 g/dL (ref 3.5–5.2)
Alkaline Phosphatase: 97 U/L (ref 39–117)
BUN: 14 mg/dL (ref 6–23)
CO2: 29 mEq/L (ref 19–32)
Calcium: 10 mg/dL (ref 8.4–10.5)
Chloride: 104 mEq/L (ref 96–112)
Creatinine, Ser: 0.87 mg/dL (ref 0.40–1.20)
GFR: 78.77 mL/min (ref >60.00)
Glucose, Bld: 148 mg/dL — ABNORMAL HIGH (ref 70–99)
Potassium: 3.4 mEq/L — ABNORMAL LOW (ref 3.5–5.1)
Sodium: 137 mEq/L (ref 135–145)
Total Bilirubin: 0.4 mg/dL (ref 0.2–1.2)
Total Protein: 7.8 g/dL (ref 6.0–8.3)

## 2021-02-25 LAB — PROTIME-INR
INR: 1.1 ratio — ABNORMAL HIGH (ref 0.8–1.0)
Prothrombin Time: 12.4 s (ref 9.6–13.1)

## 2021-02-25 MED ORDER — SUTAB 1479-225-188 MG PO TABS: 1.0000 | ORAL_TABLET | Freq: Once | ORAL | 0 refills | Status: AC

## 2021-02-25 NOTE — Patient Instructions (Signed)
Your provider has requested that you go to the basement level for lab work before leaving today. Press "B" on the elevator. The lab is located at the first door on the left as you exit the elevator.  You have been scheduled for an endoscopy and colonoscopy. Please follow the written instructions given to you at your visit today. Please pick up your prep supplies at the pharmacy within the next 1-3 days. If you use inhalers (even only as needed), please bring them with you on the day of your procedure.   

## 2021-02-25 NOTE — Progress Notes (Signed)
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  This was mid October 2020.  During that stay she was noted to have abnormal liver tests with AST in the 200-300 range, ALT in the 70-130 range.  Bilirubin as high as 1.7.  Alkaline phosphatase about 200.  Iron studies were unremarkable.  Discharge hemoglobin 8.4.  MCV 85.3.  Platelet count 96.  Normal imaging and abdominal ultrasound and CT scan revealed cholelithiasis.  Changes of fatty liver.  Possible rectal thickening of uncertain significance. Patient tells me that she is separated from her husband.  She tells me that she has been sober since her hospital discharge in October except for 1 day around [place] when she had 4 glasses of wine.  She continues to work through her personal issues feels that she is doing better.  She tells me that her sister was diagnosed with colon cancer around age 28 and that she needs colonoscopy.  Also reports occasional problems with reflux related to diet.  No dysphagia.  She has not been vaccinated against COVID.  REVIEW OF SYSTEMS:  All non-GI ROS negative unless otherwise stated in the HPI except for anxiety, depression  Past Medical History:  Diagnosis Date  . Alcohol abuse   . Anemia   . Anxiety   . Depression   . GERD (gastroesophageal reflux disease)   . Hypertension     Past Surgical History:  Procedure Laterality Date  . CESAREAN SECTION  1998    Social History Allison [name]  reports that she has never smoked. She has never used smokeless tobacco. She reports current alcohol use of about 14.0 standard drinks of alcohol per week. She reports current drug use. Drug: Marijuana.  family history includes Colon cancer (age of onset: 42) in her sister; Diabetes in her father and mother;  Hypertension in her father and mother.  Allergies  Allergen Reactions  . Lisinopril Cough       PHYSICAL EXAMINATION: Vital signs: BP (!) 168/72   Pulse 66   Ht 5\' 3"  (1.6 m)   Wt 176 lb (79.8 kg)   LMP 01/27/2020   SpO2 98%   BMI 31.18 kg/m   Constitutional: generally well-appearing, no acute distress Psychiatric: alert and oriented x3, cooperative Eyes: extraocular movements intact, anicteric, conjunctiva pink Mouth: oral pharynx moist, no lesions Neck: supple no lymphadenopathy Cardiovascular: heart regular rate and rhythm, no murmur Lungs: clear to auscultation bilaterally Abdomen: soft, nontender, nondistended, no obvious ascites, no peritoneal signs, normal bowel sounds, no organomegaly Rectal: Deferred to colonoscopy Extremities: no clubbing, cyanosis, or lower extremity edema bilaterally Skin: no lesions on visible extremities Neuro: No focal deficits. No asterixis.    ASSESSMENT:  1.  Alcoholic hepatitis October 2021 2.  History of significant alcohol abuse with behavioral health hospitalization 3.  History of alcohol withdrawal October 2021 4.  Abstinent from alcohol except 1 day since October 2021 5.  Family history of colon cancer in her sister at 36 6.  GERD without alarm features 7.  Cholelithiasis 8.  Questionable rectal thickening on CT   PLAN:  1.  CBC, comprehensive metabolic panel, PT/INR today 2.  Continue complete abstinence from alcohol.  This was stressed.  She agrees 3.  Reflux precautions 4.  Schedule colonoscopy given her family history questionable abnormality on CT.The nature  of the procedure, as well as the risks, benefits, and alternatives were carefully and thoroughly reviewed with the patient. Ample time for discussion and questions allowed. The patient understood, was satisfied, and agreed to proceed. 5.  Schedule upper endoscopy chronic GERD and rule out varices.The nature of the procedure, as well as the risks, benefits, and  alternatives were carefully and thoroughly reviewed with the patient. Ample time for discussion and questions allowed. The patient understood, was satisfied, and agreed to proceed. 6.  Ongoing general medical care with PCP A total time of 60 minutes was spent preparing to see the patient, reviewing previous hospitalization, x-rays, and laboratories.  Also, obtaining comprehensive history and performing comprehensive physical examination.  Counseling the patient regarding her above listed issues, ordering advanced procedures blood work, and documenting clinical information in the health record

## 2021-03-10 ENCOUNTER — Other Ambulatory Visit: Payer: Self-pay

## 2021-03-10 ENCOUNTER — Ambulatory Visit (HOSPITAL_COMMUNITY)
Admission: EM | Admit: 2021-03-10 | Discharge: 2021-03-11 | Disposition: A | Discharge status: Home / Self Care | Payer: Medicaid Other | Attending: Psychiatry | Admitting: Psychiatry

## 2021-03-10 DIAGNOSIS — F1024 Alcohol dependence with alcohol-induced mood disorder: Secondary | ICD-10-CM | POA: Diagnosis not present

## 2021-03-10 DIAGNOSIS — F1094 Alcohol use, unspecified with alcohol-induced mood disorder: Secondary | ICD-10-CM

## 2021-03-10 DIAGNOSIS — Z20822 Contact with and (suspected) exposure to covid-19: Secondary | ICD-10-CM | POA: Insufficient documentation

## 2021-03-10 DIAGNOSIS — F10229 Alcohol dependence with intoxication, unspecified: Secondary | ICD-10-CM

## 2021-03-10 LAB — POCT URINE DRUG SCREEN - MANUAL ENTRY (I-SCREEN)
POC Amphetamine UR: NOT DETECTED
POC Buprenorphine (BUP): NOT DETECTED
POC Cocaine UR: NOT DETECTED
POC Marijuana UR: NOT DETECTED
POC Methadone UR: NOT DETECTED
POC Methamphetamine UR: NOT DETECTED
POC Morphine: NOT DETECTED
POC Oxazepam (BZO): NOT DETECTED
POC Oxycodone UR: NOT DETECTED
POC Secobarbital (BAR): NOT DETECTED

## 2021-03-10 LAB — POC SARS CORONAVIRUS 2 AG
SARS Coronavirus 2 Ag: NEGATIVE
SARS Coronavirus 2 Ag: NEGATIVE

## 2021-03-10 MED ORDER — ACETAMINOPHEN 325 MG PO TABS
650.0000 mg | ORAL_TABLET | Freq: Four times a day (QID) | ORAL | Status: DC | PRN
  Administered 2021-03-11: 650 mg via ORAL
  Filled 2021-03-10: qty 2

## 2021-03-10 MED ORDER — MAGNESIUM HYDROXIDE 400 MG/5ML PO SUSP: 30.0000 mL | Freq: Every day | ORAL | Status: DC | PRN

## 2021-03-10 MED ORDER — LOPERAMIDE HCL 2 MG PO CAPS: 2.0000 mg | ORAL_CAPSULE | ORAL | Status: DC | PRN

## 2021-03-10 MED ORDER — HYDROXYZINE HCL 25 MG PO TABS: 25.0000 mg | ORAL_TABLET | Freq: Three times a day (TID) | ORAL | Status: DC | PRN

## 2021-03-10 MED ORDER — ALUM & MAG HYDROXIDE-SIMETH 200-200-20 MG/5ML PO SUSP: 30.0000 mL | ORAL | Status: DC | PRN

## 2021-03-10 MED ORDER — LOSARTAN POTASSIUM 50 MG PO TABS
50.0000 mg | ORAL_TABLET | Freq: Every day | ORAL | Status: DC
  Administered 2021-03-11: 50 mg via ORAL
  Filled 2021-03-10: qty 1

## 2021-03-10 MED ORDER — ADULT MULTIVITAMIN W/MINERALS CH
1.0000 | ORAL_TABLET | Freq: Every day | ORAL | Status: DC
  Administered 2021-03-11: 1 via ORAL
  Filled 2021-03-10: qty 1

## 2021-03-10 MED ORDER — CARVEDILOL 12.5 MG PO TABS
25.0000 mg | ORAL_TABLET | Freq: Two times a day (BID) | ORAL | Status: DC
  Administered 2021-03-11: 25 mg via ORAL
  Filled 2021-03-10: qty 2

## 2021-03-10 MED ORDER — FOLIC ACID 1 MG PO TABS
1.0000 mg | ORAL_TABLET | Freq: Every day | ORAL | Status: DC
  Administered 2021-03-11: 1 mg via ORAL
  Filled 2021-03-10: qty 1

## 2021-03-10 MED ORDER — ONDANSETRON 4 MG PO TBDP: 4.0000 mg | ORAL_TABLET | Freq: Four times a day (QID) | ORAL | Status: DC | PRN

## 2021-03-10 MED ORDER — LORAZEPAM 1 MG PO TABS
1.0000 mg | ORAL_TABLET | Freq: Four times a day (QID) | ORAL | Status: DC | PRN
  Administered 2021-03-11: 1 mg via ORAL
  Filled 2021-03-10: qty 1

## 2021-03-10 MED ORDER — TRAZODONE HCL 50 MG PO TABS: 50.0000 mg | ORAL_TABLET | Freq: Every evening | ORAL | Status: DC | PRN

## 2021-03-10 MED ORDER — THIAMINE HCL 100 MG PO TABS
100.0000 mg | ORAL_TABLET | Freq: Every day | ORAL | Status: DC
  Administered 2021-03-11: 100 mg via ORAL
  Filled 2021-03-10 (×2): qty 1

## 2021-03-10 NOTE — BH Assessment (Signed)
Comprehensive Clinical Assessment (CCA) Note  03/10/2021 LORELEE [name] [id number]   Disposition: Nira Conn, PMHNP recommends pt to be admitted to [facility] Observation Unit. Disposition discussed with Joanie Coddington, RN.   The patient demonstrates the following risk factors for suicide: Chronic risk factors for suicide include: N/A. Acute risk factors for suicide include: per IVC pt attempted to kill herself by taking several pills however pt denies. Protective factors for this patient include: positive social support and responsibility to others (children, family). Considering these factors, the overall suicide risk at this point appears to be moderate. Patient is appropriate for outpatient follow up.  Allison [name] is a 50 year old female who presents voluntary and unaccompanied to [facility]. Clinician asked the pt, "what brought you to the hospital?" Pt replied, "I want to know who did this; my mama, my sister they live two and a half hours away." Pt reported, her marriage is messed up because of her family. Pt reported, her husband left in April 2021 and has their youngest daughter. Pt then reported, because she wouldn't let her neighbor in home and she called police on her. Pt denies, SI, HI, AVH, self-injurious behaviors and access to weapons.   Pt was IVC'd by first cousin Reed Breech, [phone]). Per IVC paperwork: "[name] has attempted to kill herself by taking several pills. The attempt was stopped the petitioner. The respondent stated that she wanted to die."   Pt denies, substance use. Pt's UDS/BAL is pending. Pt denies, being linked to OPT resources (medication management and/or counseling.)  Per chart, pt had a previous inpatient admission at [facility] in 2020.  Pt presents alert with normal speech. Pt's mood, affect was pleasant, irritable. Pt insight, judgement was poor. Pt's thought content was tangential. Pt reported, she can contract for safety if discharged.   Diagnosis:  Alcohol induced mood Disorder.   *Clinician attempted to contact IVC petitioner to gather additional information, the phone rang then clinician received the message, "please try your call again later." Clinician was unable to leave a HIPPA compliant message.*   Chief Complaint: No chief complaint on file.  Visit Diagnosis:     CCA Screening, Triage and Referral (STR)  Patient Reported Information How did you hear about [place]? Legal System  Referral name: G[employer]  Referral phone number: 911   Whom do you see for routine medical problems? No data recorded Practice/Facility Name: No data recorded Practice/Facility Phone Number: No data recorded Name of Contact: No data recorded Contact Number: No data recorded Contact Fax Number: No data recorded Prescriber Name: No data recorded Prescriber Address (if known): No data recorded  What Is the Reason for Your Visit/Call Today? GPD  How Long Has This Been Causing You Problems? No data recorded What Do You Feel Would Help You the Most Today? -- (Pt was to go home.)   Have You Recently Been in Any Inpatient Treatment (Hospital/Detox/Crisis Center/28-Day Program)? No data recorded Name/Location of Program/Hospital:No data recorded How Long Were You There? No data recorded When Were You Discharged? No data recorded  Have You Ever Received Services From [facility] Before? Yes  Who Do You See at [facility]? Pt has previous ED visits and a previous inpatient admission in 2020.   Have You Recently Had Any Thoughts About Hurting Yourself? No (Pt denies.)  Are You Planning to Commit Suicide/Harm Yourself At This time? No (Pt denies.)   Have you Recently Had Thoughts About Hurting Someone Karolee Ohs? No (Pt denies.)  Explanation: No data  recorded  Have You Used Any Alcohol or Drugs in the Past 24 Hours? No (Pt denies.)  How Long Ago Did You Use Drugs or Alcohol? No data recorded What Did You Use and How Much? No data recorded  Do You  Currently Have a Therapist/Psychiatrist? No  Name of Therapist/Psychiatrist: No data recorded  Have You Been Recently Discharged From Any Office Practice or Programs? No data recorded Explanation of Discharge From Practice/Program: No data recorded    CCA Screening Triage Referral Assessment Type of Contact: Face-to-Face  Is this Initial or Reassessment? No data recorded Date Telepsych consult ordered in [facility]:  10/07/2020  Time Telepsych consult ordered in [facility]:  No data recorded  Patient Reported Information Reviewed? Yes  Patient Left Without Being Seen? No data recorded Reason for Not Completing Assessment: No data recorded  Collateral Involvement: Clinicain attempted to contact IVC petitioner Reed Breech([name], cousin) however the was no answer.   Does Patient Have a Automotive engineerCourt Appointed Legal Guardian? No data recorded Name and Contact of Legal Guardian: NA  If Minor and Not Living with Parent(s), Who has Custody? No data recorded Is CPS involved or ever been involved? No data recorded Is APS involved or ever been involved? No data recorded  Patient Determined To Be At Risk for Harm To Self or Others Based on Review of Patient Reported Information or Presenting Complaint? Yes, for Self-Harm (Per IVC.)  Method: No data recorded Availability of Means: No data recorded Intent: No data recorded Notification Required: No data recorded Additional Information for Danger to Others Potential: No data recorded Additional Comments for Danger to Others Potential: No data recorded Are There Guns or Other Weapons in Your Home? No data recorded Types of Guns/Weapons: No data recorded Are These Weapons Safely Secured?                            No data recorded Who Could Verify You Are Able To Have These Secured: No data recorded Do You Have any Outstanding Charges, Pending Court Dates, Parole/Probation? No data recorded Contacted To Inform of Risk of Harm To Self or Others: No data  recorded  [facility]   Does Patient Present under Involuntary Commitment? Yes  IVC Papers Initial File Date: 03/10/2021   [place]   Patient Currently Receiving the Following Services: Not Receiving Services   Determination of Need: Urgent (48 hours)   [facility]; Outpatient Therapy; Medication Management; Inpatient Hospitalization     CCA Biopsychosocial Intake/Chief Complaint:  Per IVC "[name] has attempted to kill herself by taking several pills. The attempt was stopped the petitioner. The respondent stated that she wanted to die." Pt denies, SI or attempts.  Current Symptoms/Problems: Pt under IVC, pt denies, depression/anxiety symptoms.   Patient Reported Schizophrenia/Schizoaffective Diagnosis in Past: No   Strengths: Not assessed.  Preferences: Not assessed.  Abilities: Not assessed.   Type of Services Patient Feels are Needed: Not assessed.   Initial Clinical Notes/Concerns: Not assessed.   Mental Health Symptoms Depression:  None (Pt denies.)   Duration of Depressive symptoms: No data recorded  Mania:  None (Pt denies.)   Anxiety:   None (Pt denies.)   Psychosis:  None   Duration of Psychotic symptoms: No data recorded  Trauma:  None   Obsessions:  -- (Pt denies.)   Compulsions:  None (Pt denies.)   Inattention:  None (Pt denies.)  Hyperactivity/Impulsivity:  N/A (Pt denies.)   Oppositional/Defiant Behaviors:  None (Pt denies.)   Emotional Irregularity:  No data recorded  Other Mood/Personality Symptoms:  No data recorded   Mental Status Exam Appearance and self-care  Stature:  Average   Weight:  Average weight   Clothing:  No data recorded  Grooming:  Normal   Cosmetic use:  None   Posture/gait:  No data recorded  Motor activity:  No data recorded  Sensorium  Attention:  Distractible   Concentration:  Normal   Orientation:   Person; Object   Recall/memory:  Defective in Immediate   Affect and Mood  Affect:  Other (Comment) (Irritable.)   Mood:  Irritable   Relating  Eye contact:  -- (Fair.)   Facial expression:  No data recorded  Attitude toward examiner:  No data recorded  Thought and Language  Speech flow: Normal   Thought content:  Appropriate to Mood and Circumstances   Preoccupation:  None   Hallucinations:  None   Organization:  No data recorded  [employer] of Knowledge:  Poor   Intelligence:  Average   Abstraction:  No data recorded  Judgement:  Poor   Reality Testing:  No data recorded  Insight:  Poor   Decision Making:  No data recorded  Social Functioning  Social Maturity:  -- Industrial/product designer)   Social Judgement:  -- Industrial/product designer)   Stress  Stressors:  -- (UTA)   Coping Ability:  -- Industrial/product designer)   Skill Deficits:  No data recorded  Supports:  Support needed     Religion: Religion/Spirituality Are You A Religious Person?:  (Pt replied, "Holiness Hallelujah," and danced a little.)  Leisure/Recreation: Leisure / Recreation Do You Have Hobbies?: Yes Leisure and Hobbies: Skating  Exercise/Diet: Exercise/Diet Do You Exercise?: Yes What Type of Exercise Do You Do?: Run/Walk Do You Follow a Special Diet?: No   CCA Employment/Education Employment/Work Situation: Employment / Work Situation Employment situation: Employed Where is patient currently employed?: [employer] How long has patient been employed?: Six months. What is the longest time patient has a held a job?: Not assessed. Where was the patient employed at that time?: Not assessed. Has patient ever been in the [employer]?: No  Education: Education Is Patient Currently Attending School?: No Did Garment/textile technologist From [employer]?: Yes   CCA Family/Childhood History Family and Relationship History: Family history Marital status: Separated Separated, when?: April 2021 What types of issues is patient dealing with in  the relationship?: Pt reported, her husband left her. Additional relationship information: Not assessed. What is your sexual orientation?: Not assessed. Has your sexual activity been affected by drugs, alcohol, medication, or emotional stress?: Not assessed. Does patient have children?: Yes How many children?: 5 How is patient's relationship with their children?: Pt reported, her youngest daughter is with her father (husband.)  Childhood History:  Childhood History By whom was/is the patient raised?: Other (Comment) (Not assessed.) Additional childhood history information: Not assessed. Description of patient's relationship with caregiver when they were a child: Not assessed. Patient's description of current relationship with people who raised him/her: Not assessed. How were you disciplined when you got in trouble as a child/adolescent?: Not assessed. Does patient have siblings?: Yes Number of Siblings: 12 Did patient suffer any verbal/emotional/physical/sexual abuse as a child?: No (Pt denies.) Did patient suffer from severe childhood neglect?: No (Pt denies.) Has patient ever been sexually abused/assaulted/raped as an adolescent or adult?: No (Pt denies.) Witnessed domestic violence?: No Has patient been  affected by domestic violence as an adult?:  (NA)  Child/Adolescent Assessment:     CCA Substance Use Alcohol/Drug Use: Alcohol / Drug Use Pain Medications: See MAR Prescriptions: See MAR Over the Counter: See MAR History of alcohol / drug use?: No history of alcohol / drug abuse (Pt denies substance  use but has a history of alcohol use.)    ASAM's:  Six Dimensions of Multidimensional Assessment  Dimension 1:  Acute Intoxication and/or Withdrawal Potential:      Dimension 2:  Biomedical Conditions and Complications:      Dimension 3:  Emotional, Behavioral, or Cognitive Conditions and Complications:     Dimension 4:  Readiness to Change:     Dimension 5:  Relapse,  Continued use, or Continued Problem Potential:     Dimension 6:  Recovery/Living Environment:     ASAM Severity Score:    ASAM Recommended Level of Treatment:     Substance use Disorder (SUD)    Recommendations for Services/Supports/Treatments: Recommendations for Services/Supports/Treatments Recommendations For Services/Supports/Treatments: Other (Comment) ([facility].)  DSM5 Diagnoses: Patient Active Problem List   Diagnosis Date Noted  . Alcohol withdrawal delirium (HCC) 10/10/2020  . Hypokalemia 10/10/2020  . Severe recurrent major depression without psychotic features (HCC) 10/09/2020  . Suicidal ideation 10/08/2020  . Alcohol use disorder, severe, dependence (HCC) 10/08/2020    Class: Chronic  . Major depressive disorder, recurrent episode, severe (HCC) 10/08/2020    Class: Chronic  . AKI (acute kidney injury) (HCC) 01/29/2020  . Acute blood loss anemia 01/29/2020  . Alcohol withdrawal (HCC) 01/29/2020  . Lactic acidosis 01/29/2020  . Fall at home, initial encounter 01/29/2020  . Thrombocytopenia (HCC) 01/29/2020  . Acute colitis 01/29/2020  . Fracture of one rib, left side, initial encounter for closed fracture 01/29/2020  . Anemia 08/19/2017  . Acute pyelonephritis 05/22/2016  . Pyelonephritis 05/22/2016  . Hyperglycemia 05/22/2016  . Sepsis (HCC) 05/22/2016  . Hypertension 05/22/2016    Referrals to Alternative Service(s): Referred to Alternative Service(s):   Place:   Date:   Time:    Referred to Alternative Service(s):   Place:   Date:   Time:    Referred to Alternative Service(s):   Place:   Date:   Time:    Referred to Alternative Service(s):   Place:   Date:   Time:     Redmond Pulling, [facility]  Comprehensive Clinical Assessment (CCA) Screening, Triage and Referral Note  03/10/2021 Allison [name] [id number]  Chief Complaint: No chief complaint on file.  Visit Diagnosis:   Patient Reported Information How did you hear about [place]? Legal  System   Referral name: GPD   Referral phone number: 911  Whom do you see for routine medical problems? No data recorded  Practice/Facility Name: No data recorded  Practice/Facility Phone Number: No data recorded  Name of Contact: No data recorded  Contact Number: No data recorded  Contact Fax Number: No data recorded  Prescriber Name: No data recorded  Prescriber Address (if known): No data recorded What Is the Reason for Your Visit/Call Today? GPD  How Long Has This Been Causing You Problems? No data recorded Have You Recently Been in Any Inpatient Treatment (Hospital/Detox/Crisis Center/28-Day Program)? No data recorded  Name/Location of Program/Hospital:No data recorded  How Long Were You There? No data recorded  When Were You Discharged? No data recorded Have You Ever Received Services From [facility] Before? Yes   Who Do You See at [facility]? Pt has  previous ED visits and a previous inpatient admission in 2020.  Have You Recently Had Any Thoughts About Hurting Yourself? No (Pt denies.)   Are You Planning to Commit Suicide/Harm Yourself At This time?  No (Pt denies.)  Have you Recently Had Thoughts About Hurting Someone Karolee Ohs? No (Pt denies.)   Explanation: No data recorded Have You Used Any Alcohol or Drugs in the Past 24 Hours? No (Pt denies.)   How Long Ago Did You Use Drugs or Alcohol?  No data recorded  What Did You Use and How Much? No data recorded What Do You Feel Would Help You the Most Today? -- (Pt was to go home.)  Do You Currently Have a Therapist/Psychiatrist? No   Name of Therapist/Psychiatrist: No data recorded  Have You Been Recently Discharged From Any Office Practice or Programs? No data recorded  Explanation of Discharge From Practice/Program:  No data recorded    CCA Screening Triage Referral Assessment Type of Contact: Face-to-Face   Is this Initial or Reassessment? No data recorded  Date Telepsych consult ordered in [facility]:   10/07/2020   Time Telepsych consult ordered in [facility]:  No data recorded Patient Reported Information Reviewed? Yes   Patient Left Without Being Seen? No data recorded  Reason for Not Completing Assessment: No data recorded Collateral Involvement: Clinicain attempted to contact IVC petitioner Reed Breech, cousin) however the was no answer.  Does Patient Have a Automotive engineer Guardian? No data recorded  Name and Contact of Legal Guardian:  NA  If Minor and Not Living with Parent(s), Who has Custody? No data recorded Is CPS involved or ever been involved? No data recorded Is APS involved or ever been involved? No data recorded Patient Determined To Be At Risk for Harm To Self or Others Based on Review of Patient Reported Information or Presenting Complaint? Yes, for Self-Harm (Per IVC.)   Method: No data recorded  Availability of Means: No data recorded  Intent: No data recorded  Notification Required: No data recorded  Additional Information for Danger to Others Potential:  No data recorded  Additional Comments for Danger to Others Potential:  No data recorded  Are There Guns or Other Weapons in Your Home?  No data recorded   Types of Guns/Weapons: No data recorded   Are These Weapons Safely Secured?                              No data recorded   Who Could Verify You Are Able To Have These Secured:    No data recorded Do You Have any Outstanding Charges, Pending Court Dates, Parole/Probation? No data recorded Contacted To Inform of Risk of Harm To Self or Others: No data recorded Location of Assessment: [facility] Assessment Services  Does Patient Present under Involuntary Commitment? Yes   IVC Papers Initial File Date: 03/10/2021   [place] of Residence: [place]  Patient Currently Receiving the Following Services: Not Receiving Services   Determination of Need: Urgent (48 hours)   Options For Referral: [facility] Urgent Care; Outpatient Therapy; Medication Management; Inpatient  Hospitalization   Redmond Pulling, [facility]     Redmond Pulling, MS, [facility], [facility] Triage Specialist [phone]

## 2021-03-10 NOTE — ED Provider Notes (Signed)
Behavioral Health Admission [facility] & OBS)  Date: 03/11/21 Patient Name: Allison [name] MRN: [id number] Chief Complaint:  Chief Complaint  Patient presents with  . IVC    Suicidal   Chief Complaint/Presenting Problem: Per IVC "[name] has attempted to kill herself by taking several pills. The attempt was stopped the petitioner. The respondent stated that she wanted to die." Pt denies, SI or attempts.  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 female with a history of depression and alcohol use disorder who presents to [facility] with [facility] under IVC. Patient was petitioned for IVC by her first cousin Reed Breech, [phone]). Per IVC paperwork: "[name] has attempted to kill herself by taking several pills. The attempt was stopped the petitioner. The respondent stated that she wanted to die."   On evaluation, patient is alert and oriented x 4, pleasant, and cooperative. Speech is clear and coherent, but tangential.  She requires frequent redirection to stay on topic.  Patient adamantly denies that she is suicidal and denies attempting to overdose. She states "sir, you are probably more likely to go out and kill someone than I am to kill myself." She states that her family has been monitoring everything she does since her husband left her in April 2021. Patient reports that she stopped drinking alcohol after she was inpatient at [facility] in 09/2020. However, she states that she did drink a 1/2 bottle of wine yesterday. She denies having any alcohol today. Her BAL is 216. She denies use of marijuana and other substances. UDS negative. She denies auditory and visual hallucinations. No indication that she is responding to internal stimuli.   Patient was admitted to behavioral health at [facility] 10/09/2020 due to alcohol abuse and depression. She was  transferred to a medical unit on 10/10/2020 after developing symptoms of delirium tremens. It does not appear that she required intubation or use of precedex. She was discharged on 10/13/2020.   PHQ 2-9:   Flowsheet Row ED from 03/10/2021 in [facility] Admission (Discharged) from 10/09/2020 in [facility] INPATIENT BEHAVIORAL MEDICINE ED from 10/06/2020 in [facility]  C-SSRS RISK CATEGORY High Risk No Risk Low Risk       Total Time spent with patient: 30 minutes  Musculoskeletal  Strength & Muscle Tone: within normal limits Gait & Station: normal Patient leans: N/A  Psychiatric Specialty Exam  Presentation General Appearance: Disheveled  Eye Contact:Good  Speech:Clear and Coherent; Normal Rate  Speech Volume:Normal  Handedness:Right   Mood and Affect  Mood:Anxious  Affect:Congruent   Thought Process  Thought Processes:Coherent; Linear  Descriptions of Associations:Tangential  Orientation:Full (Time, Place and Person)  Thought Content:Tangential    Hallucinations:Hallucinations: None  Ideas of Reference:None  Suicidal Thoughts:Suicidal Thoughts: No  Homicidal Thoughts:Homicidal Thoughts: No   Sensorium  Memory:Immediate Good; Recent Good; Remote Good  Judgment:Intact  Insight:Fair   Executive Functions  Concentration:Fair  Attention Span:Fair  Recall:Fair  Fund of Knowledge:Good  Language:Good   Psychomotor Activity  Psychomotor Activity:Psychomotor Activity: Restlessness   Assets  Assets:Communication Skills; Desire for Improvement; Financial Resources/Insurance; Housing; Physical Health   Sleep  Sleep:Sleep: Fair   Nutritional Assessment (For OBS and FBC admissions only) Has the patient had a weight loss or gain of 10 pounds or more in the last 3 months?: No Has the patient had a decrease in food intake/or  appetite?: No Does the patient have dental problems?: No Does the  patient have eating habits or behaviors that may be indicators of an eating disorder including binging or inducing vomiting?: No Has the patient recently lost weight without trying?: No Has the patient been eating poorly because of a decreased appetite?: No Malnutrition Screening Tool Score: 0    Physical Exam Constitutional:      General: She is not in acute distress.    Appearance: She is not ill-appearing, toxic-appearing or diaphoretic.  HENT:     Head: Normocephalic.     Right Ear: External ear normal.     Left Ear: External ear normal.  Eyes:     Conjunctiva/sclera: Conjunctivae normal.     Pupils: Pupils are equal, round, and reactive to light.  Cardiovascular:     Rate and Rhythm: Normal rate.  Pulmonary:     Effort: Pulmonary effort is normal. No respiratory distress.  Musculoskeletal:        General: Normal range of motion.  Skin:    General: Skin is warm and dry.  Neurological:     Mental Status: She is alert and oriented to person, place, and time.  Psychiatric:        Attention and Perception: She does not perceive auditory hallucinations.        Mood and Affect: Mood is anxious.        Behavior: Behavior is cooperative.        Thought Content: Thought content is not paranoid or delusional. Thought content does not include homicidal or suicidal ideation.    Review of Systems  Constitutional: Negative for chills, diaphoresis, fever, malaise/fatigue and weight loss.  HENT: Negative for congestion.   Respiratory: Negative for cough and shortness of breath.   Cardiovascular: Negative for chest pain and palpitations.  Gastrointestinal: Negative for diarrhea, nausea and vomiting.  Neurological: Negative for dizziness and seizures.  Psychiatric/Behavioral: Positive for depression and substance abuse. Negative for hallucinations, memory loss and suicidal ideas. The patient is nervous/anxious and has insomnia.   All other systems reviewed and are negative.   Blood  pressure (!) 180/79, pulse 92, temperature 98.7 F (37.1 C), temperature source Oral, resp. rate 20, SpO2 99 %. There is no height or weight on file to calculate BMI.  Past Psychiatric History: Alcohol use disorder. Patient was admitted to behavioral health at [facility] 10/09/2020 due to alcohol abuse and depression. She was transferred to a medical unit on 10/10/2020 after developing symptoms of delirium tremens. It does not appear that she required intubation or use of precedex. She was discharged on 10/13/2020.   Is the patient at risk to self? Yes  Has the patient been a risk to self in the past 6 months? Yes .    Has the patient been a risk to self within the distant past? No   Is the patient a risk to others? No   Has the patient been a risk to others in the past 6 months? No   Has the patient been a risk to others within the distant past? No   Past Medical History:  Past Medical History:  Diagnosis Date  . Alcohol abuse   . Anemia   . Anxiety   . Depression   . GERD (gastroesophageal reflux disease)   . Hypertension     Past Surgical History:  Procedure Laterality Date  . CESAREAN SECTION  1998    Family History:  Family History  Problem Relation Age of Onset  .  Hypertension Mother   . Diabetes Mother   . Hypertension Father   . Diabetes Father   . Colon cancer Sister 31    Social History:  Social History   Socioeconomic History  . Marital status: Married    Spouse name: Not on file  . Number of children: Not on file  . Years of education: Not on file  . Highest education level: Not on file  Occupational History  . Not on file  Tobacco Use  . Smoking status: Never Smoker  . Smokeless tobacco: Never Used  Substance and Sexual Activity  . Alcohol use: Yes    Alcohol/week: 14.0 standard drinks    Types: 14 Standard drinks or equivalent per week    Comment: pint of liquor  . Drug use: Yes    Types: Marijuana  . Sexual activity: Yes    Birth control/protection:  Implant  Other Topics Concern  . Not on file  Social History Narrative  . Not on file   Social Determinants of Health   Financial Resource Strain: Not on file  Food Insecurity: Not on file  Transportation Needs: Not on file  Physical Activity: Not on file  Stress: Not on file  Social Connections: Not on file  Intimate Partner Violence: Not on file    SDOH:  SDOH Screenings   Alcohol Screen: Low Risk   . Last Alcohol Screening Score (AUDIT): 6  Depression (PHQ2-9): Not on file  Financial Resource Strain: Not on file  Food Insecurity: Not on file  Housing: Not on file  Physical Activity: Not on file  Social Connections: Not on file  Stress: Not on file  Tobacco Use: Low Risk   . Smoking Tobacco Use: Never Smoker  . Smokeless Tobacco Use: Never Used  Transportation Needs: Not on file    Last Labs:  Admission on 03/10/2021  Component Date Value Ref Range Status  . SARS Coronavirus 2 by RT PCR 03/10/2021 NEGATIVE  NEGATIVE Final   Comment: (NOTE) SARS-CoV-2 target nucleic acids are NOT DETECTED.  The SARS-CoV-2 RNA is generally detectable in upper respiratory specimens during the acute phase of infection. The lowest concentration of SARS-CoV-2 viral copies this assay can detect is 138 copies/mL. A negative result does not preclude SARS-Cov-2 infection and should not be used as the sole basis for treatment or other patient management decisions. A negative result may occur with  improper specimen collection/handling, submission of specimen other than nasopharyngeal swab, presence of viral mutation(s) within the areas targeted by this assay, and inadequate number of viral copies(<138 copies/mL). A negative result must be combined with clinical observations, patient history, and epidemiological information. The expected result is Negative.  Fact Sheet for Patients:  BloggerCourse.com  Fact Sheet for Healthcare Providers:   SeriousBroker.it  This test is no                          t yet approved or cleared by the [place] FDA and  has been authorized for detection and/or diagnosis of SARS-CoV-2 by FDA under an Emergency Use Authorization (EUA). This EUA will remain  in effect (meaning this test can be used) for the duration of the COVID-19 declaration under Section 564(b)(1) of the Act, 21 U.S.C.section 360bbb-3(b)(1), unless the authorization is terminated  or revoked sooner.      . Influenza A by PCR 03/10/2021 NEGATIVE  NEGATIVE Final  . Influenza B by PCR 03/10/2021 NEGATIVE  NEGATIVE Final  Comment: (NOTE) The Xpert Xpress SARS-CoV-2/FLU/RSV plus assay is intended as an aid in the diagnosis of influenza from Nasopharyngeal swab specimens and should not be used as a sole basis for treatment. Nasal washings and aspirates are unacceptable for Xpert Xpress SARS-CoV-2/FLU/RSV testing.  Fact Sheet for Patients: BloggerCourse.com  Fact Sheet for Healthcare Providers: SeriousBroker.it  This test is not yet approved or cleared by the [place] FDA and has been authorized for detection and/or diagnosis of SARS-CoV-2 by FDA under an Emergency Use Authorization (EUA). This EUA will remain in effect (meaning this test can be used) for the duration of the COVID-19 declaration under Section 564(b)(1) of the Act, 21 U.S.C. section 360bbb-3(b)(1), unless the authorization is terminated or revoked.  Performed at [facility], [address] [place], [place] [address]   . WBC 03/10/2021 6.8  4.0 - 10.5 K/uL Final  . RBC 03/10/2021 5.55* 3.87 - 5.11 MIL/uL Final  . Hemoglobin 03/10/2021 13.5  12.0 - 15.0 g/dL Final  . HCT 91/47/8295 43.0  36.0 - 46.0 % Final  . MCV 03/10/2021 77.5* 80.0 - 100.0 fL Final  . MCH 03/10/2021 24.3* 26.0 - 34.0 pg Final  . MCHC 03/10/2021 31.4  30.0 - 36.0 g/dL Final  . RDW 62/13/0865  16.6* 11.5 - 15.5 % Final  . Platelets 03/10/2021 311  150 - 400 K/uL Final  . nRBC 03/10/2021 0.0  0.0 - 0.2 % Final  . Neutrophils Relative % 03/10/2021 44  % Final  . Neutro Abs 03/10/2021 3.0  1.7 - 7.7 K/uL Final  . Lymphocytes Relative 03/10/2021 48  % Final  . Lymphs Abs 03/10/2021 3.2  0.7 - 4.0 K/uL Final  . Monocytes Relative 03/10/2021 5  % Final  . Monocytes Absolute 03/10/2021 0.4  0.1 - 1.0 K/uL Final  . Eosinophils Relative 03/10/2021 2  % Final  . Eosinophils Absolute 03/10/2021 0.1  0.0 - 0.5 K/uL Final  . Basophils Relative 03/10/2021 1  % Final  . Basophils Absolute 03/10/2021 0.1  0.0 - 0.1 K/uL Final  . Immature Granulocytes 03/10/2021 0  % Final  . Abs Immature Granulocytes 03/10/2021 0.01  0.00 - 0.07 K/uL Final   Performed at [facility], [address], [place], [place] [address]  . Sodium 03/10/2021 139  135 - 145 mmol/L Final  . Potassium 03/10/2021 3.7  3.5 - 5.1 mmol/L Final  . Chloride 03/10/2021 104  98 - 111 mmol/L Final  . CO2 03/10/2021 24  22 - 32 mmol/L Final  . Glucose, Bld 03/10/2021 115* 70 - 99 mg/dL Final   Glucose reference range applies only to samples taken after fasting for at least 8 hours.  . BUN 03/10/2021 23* 6 - 20 mg/dL Final  . Creatinine, Ser 03/10/2021 1.16* 0.44 - 1.00 mg/dL Final  . Calcium 62/95/2841 9.3  8.9 - 10.3 mg/dL Final  . Total Protein 03/10/2021 8.6* 6.5 - 8.1 g/dL Final  . Albumin 32/44/0102 4.0  3.5 - 5.0 g/dL Final  . AST 72/53/6644 64* 15 - 41 U/L Final  . ALT 03/10/2021 44  0 - 44 U/L Final  . Alkaline Phosphatase 03/10/2021 93  38 - 126 U/L Final  . Total Bilirubin 03/10/2021 0.9  0.3 - 1.2 mg/dL Final  . GFR, Estimated 03/10/2021 58* >60 mL/min Final   Comment: (NOTE) Calculated using the CKD-EPI Creatinine Equation (2021)   . Anion gap 03/10/2021 11  5 - 15 Final   Performed at [facility], [address], [place], [place] [address]  . Alcohol, Ethyl (B) 03/10/2021 216* <10 mg/dL Final    Comment: (NOTE) Lowest detectable limit for serum alcohol is 10 mg/dL.  For medical purposes only. Performed at [facility], [address] [place], [place] [address]   . Cholesterol 03/10/2021 144  0 - 200 mg/dL Final  . Triglycerides 03/10/2021 295* <150 mg/dL Final  . HDL 70/96/2836 50  >40 mg/dL Final  . Total CHOL/HDL Ratio 03/10/2021 2.9  RATIO Final  . VLDL 03/10/2021 59* 0 - 40 mg/dL Final  . LDL Cholesterol 03/10/2021 35  0 - 99 mg/dL Final   Comment:        Total Cholesterol/HDL:CHD Risk Coronary Heart Disease Risk Table                     Men   Women  1/2 Average Risk   3.4   3.3  Average Risk       5.0   4.4  2 X Average Risk   9.6   7.1  3 X Average Risk  23.4   11.0        Use the calculated Patient Ratio above and the CHD Risk Table to determine the patient's CHD Risk.        ATP III CLASSIFICATION (LDL):  <100     mg/dL   Optimal  629-476  mg/dL   Near or Above                    Optimal  130-159  mg/dL   Borderline  546-503  mg/dL   High  >546     mg/dL   Very High Performed at [facility], [address], [place], [place] [address]   . POC Amphetamine UR 03/10/2021 None Detected  NONE DETECTED (Cut Off Level 1000 ng/mL) Final  . POC Secobarbital (BAR) 03/10/2021 None Detected  NONE DETECTED (Cut Off Level 300 ng/mL) Final  . POC Buprenorphine (BUP) 03/10/2021 None Detected  NONE DETECTED (Cut Off Level 10 ng/mL) Final  . POC Oxazepam (BZO) 03/10/2021 None Detected  NONE DETECTED (Cut Off Level 300 ng/mL) Final  . POC Cocaine UR 03/10/2021 None Detected  NONE DETECTED (Cut Off Level 300 ng/mL) Final  . POC Methamphetamine UR 03/10/2021 None Detected  NONE DETECTED (Cut Off Level 1000 ng/mL) Final  . POC Morphine 03/10/2021 None Detected  NONE DETECTED (Cut Off Level 300 ng/mL) Final  . POC Oxycodone UR 03/10/2021 None Detected  NONE DETECTED (Cut Off Level 100 ng/mL) Final  . POC Methadone UR 03/10/2021 None Detected  NONE DETECTED (Cut Off  Level 300 ng/mL) Final  . POC Marijuana UR 03/10/2021 None Detected  NONE DETECTED (Cut Off Level 50 ng/mL) Final  . SARS Coronavirus 2 Ag 03/10/2021 NEGATIVE  NEGATIVE Final   Comment: (NOTE) SARS-CoV-2 antigen NOT DETECTED.   Negative results are presumptive.  Negative results do not preclude SARS-CoV-2 infection and should not be used as the sole basis for treatment or other patient management decisions, including infection  control decisions, particularly in the presence of clinical signs and  symptoms consistent with COVID-19, or in those who have been in contact with the virus.  Negative results must be combined with clinical observations, patient history, and epidemiological information. The expected result is Negative.  Fact Sheet for Patients: https://www.jennings-kim.com/  Fact Sheet for Healthcare Providers: https://alexander-rogers.biz/  This test is not yet approved or cleared by  the [employer] and  has been authorized for detection and/or diagnosis of SARS-CoV-2 by FDA under an Emergency Use Authorization (EUA).  This EUA will remain in effect (meaning this test can be used) for the duration of  the COV                          ID-19 declaration under Section 564(b)(1) of the Act, 21 U.S.C. section 360bbb-3(b)(1), unless the authorization is terminated or revoked sooner.    [name] SARS Coronavirus 2 Ag 03/10/2021 NEGATIVE  NEGATIVE Final   Comment: (NOTE) SARS-CoV-2 antigen NOT DETECTED.   Negative results are presumptive.  Negative results do not preclude SARS-CoV-2 infection and should not be used as the sole basis for treatment or other patient management decisions, including infection  control decisions, particularly in the presence of clinical signs and  symptoms consistent with COVID-19, or in those who have been in contact with the virus.  Negative results must be combined with clinical observations, patient history, and  epidemiological information. The expected result is Negative.  Fact Sheet for Patients: https://www.jennings-kim.com/  Fact Sheet for Healthcare Providers: https://alexander-rogers.biz/  This test is not yet approved or cleared by the [place] FDA and  has been authorized for detection and/or diagnosis of SARS-CoV-2 by FDA under an Emergency Use Authorization (EUA).  This EUA will remain in effect (meaning this test can be used) for the duration of  the COV                          ID-19 declaration under Section 564(b)(1) of the Act, 21 U.S.C. section 360bbb-3(b)(1), unless the authorization is terminated or revoked sooner.    Appointment on 02/25/2021  Component Date Value Ref Range Status  . Sodium 02/25/2021 137  135 - 145 mEq/L Final  . Potassium 02/25/2021 3.4* 3.5 - 5.1 mEq/L Final  . Chloride 02/25/2021 104  96 - 112 mEq/L Final  . CO2 02/25/2021 29  19 - 32 mEq/L Final  . Glucose, Bld 02/25/2021 148* 70 - 99 mg/dL Final  . BUN 21/30/8657 14  6 - 23 mg/dL Final  . Creatinine, Ser 02/25/2021 0.87  0.40 - 1.20 mg/dL Final  . Total Bilirubin 02/25/2021 0.4  0.2 - 1.2 mg/dL Final  . Alkaline Phosphatase 02/25/2021 97  39 - 117 U/L Final  . AST 02/25/2021 28  0 - 37 U/L Final  . ALT 02/25/2021 18  0 - 35 U/L Final  . Total Protein 02/25/2021 7.8  6.0 - 8.3 g/dL Final  . Albumin 84/69/6295 3.7  3.5 - 5.2 g/dL Final  . GFR 28/41/3244 78.77  >60.00 mL/min Final   Calculated using the CKD-EPI Creatinine Equation (2021)  . Calcium 02/25/2021 10.0  8.4 - 10.5 mg/dL Final  . INR 12/28/7251 1.1* 0.8 - 1.0 ratio Final  . Prothrombin Time 02/25/2021 12.4  9.6 - 13.1 sec Final  . WBC 02/25/2021 6.6  4.0 - 10.5 K/uL Final  . RBC 02/25/2021 4.62  3.87 - 5.11 Mil/uL Final  . Hemoglobin 02/25/2021 11.3* 12.0 - 15.0 g/dL Final  . HCT 66/44/0347 35.4* 36.0 - 46.0 % Final  . MCV 02/25/2021 76.5* 78.0 - 100.0 fl Final  . MCHC 02/25/2021 32.0  30.0 - 36.0 g/dL  Final  . RDW 42/59/5638 16.9* 11.5 - 15.5 % Final  . Platelets 02/25/2021 178.0  150.0 - 400.0 K/uL Final  . Neutrophils Relative % 02/25/2021  65.0  43.0 - 77.0 % Final  . Lymphocytes Relative 02/25/2021 24.2  12.0 - 46.0 % Final  . Monocytes Relative 02/25/2021 6.6  3.0 - 12.0 % Final  . Eosinophils Relative 02/25/2021 3.6  0.0 - 5.0 % Final  . Basophils Relative 02/25/2021 0.6  0.0 - 3.0 % Final  . Neutro Abs 02/25/2021 4.3  1.4 - 7.7 K/uL Final  . Lymphs Abs 02/25/2021 1.6  0.7 - 4.0 K/uL Final  . Monocytes Absolute 02/25/2021 0.4  0.1 - 1.0 K/uL Final  . Eosinophils Absolute 02/25/2021 0.2  0.0 - 0.7 K/uL Final  . Basophils Absolute 02/25/2021 0.0  0.0 - 0.1 K/uL Final  Admission on 10/10/2020, Discharged on 10/13/2020  Component Date Value Ref Range Status  . Magnesium 10/10/2020 1.3* 1.7 - 2.4 mg/dL Final   Performed at [facility], [address] [place] [place], [place] [address]  . Phosphorus 10/10/2020 1.5* 2.5 - 4.6 mg/dL Final   Performed at [facility], [address] [place] [place], [place] [address]  . MRSA by PCR 10/10/2020 NEGATIVE  NEGATIVE Final   Comment:        The GeneXpert MRSA Assay (FDA approved for NASAL specimens only), is one component of a comprehensive MRSA colonization surveillance program. It is not intended to diagnose MRSA infection nor to guide or monitor treatment for MRSA infections. Performed at [facility], [address], [place], [place] [address]   . Sodium 10/11/2020 137  135 - 145 mmol/L Final  . Potassium 10/11/2020 3.2* 3.5 - 5.1 mmol/L Final  . Chloride 10/11/2020 101  98 - 111 mmol/L Final  . CO2 10/11/2020 29  22 - 32 mmol/L Final  . Glucose, Bld 10/11/2020 215* 70 - 99 mg/dL Final   Glucose reference range applies only to samples taken after fasting for at least 8 hours.  . BUN 10/11/2020 29* 6 - 20 mg/dL Final  . Creatinine, Ser 10/11/2020 1.77* 0.44 - 1.00 mg/dL Final  . Calcium 91/47/8295 8.9  8.9 - 10.3  mg/dL Final  . GFR, Estimated 10/11/2020 33* >60 mL/min Final  . Anion gap 10/11/2020 7  5 - 15 Final   Performed at [facility], [address], [place], [place] [address]  . WBC 10/11/2020 6.0  4.0 - 10.5 K/uL Final  . RBC 10/11/2020 3.47* 3.87 - 5.11 MIL/uL Final  . Hemoglobin 10/11/2020 9.4* 12.0 - 15.0 g/dL Final  . HCT 86/57/8469 28.7* 36.0 - 46.0 % Final  . MCV 10/11/2020 82.7  80.0 - 100.0 fL Final  . MCH 10/11/2020 27.1  26.0 - 34.0 pg Final  . MCHC 10/11/2020 32.8  30.0 - 36.0 g/dL Final  . RDW 62/95/2841 19.3* 11.5 - 15.5 % Final  . Platelets 10/11/2020 71* 150 - 400 K/uL Final   Comment: REPEATED TO VERIFY Immature Platelet Fraction may be clinically indicated, consider ordering this additional test [id number] CONSISTENT WITH PREVIOUS RESULT   . nRBC 10/11/2020 0.3* 0.0 - 0.2 % Final   Performed at [facility], [address] [place] [place], [place] [address]  . Total Protein 10/11/2020 6.8  6.5 - 8.1 g/dL Final  . Albumin 64/40/3474 3.3* 3.5 - 5.0 g/dL Final  . AST 25/95/6387 267* 15 - 41 U/L Final  . ALT 10/11/2020 76* 0 - 44 U/L Final  . Alkaline Phosphatase 10/11/2020 175* 38 - 126 U/L Final  . Total Bilirubin 10/11/2020 1.1  0.3 - 1.2 mg/dL Final  . Bilirubin, Direct 10/11/2020 0.4* 0.0 -  0.2 mg/dL Final  . Indirect Bilirubin 10/11/2020 0.7  0.3 - 0.9 mg/dL Final   Performed at [facility], [address] [place] [place], [place] [address]  . Iron 10/11/2020 37  28 - 170 ug/dL Final  . TIBC 78/93/8101 326  250 - 450 ug/dL Final  . Saturation Ratios 10/11/2020 11  10.4 - 31.8 % Final  . UIBC 10/11/2020 289  ug/dL Final   Performed at [facility], [address], [place], [place] [address]  . Ferritin 10/11/2020 196  11 - 307 ng/mL Final   Performed at [facility], [address] [place] [place], [place] [address]  . Magnesium 10/12/2020 1.6* 1.7 - 2.4 mg/dL Final   Performed at [facility], [address] [place]  [place], [place] [address]  . Sodium 10/12/2020 140  135 - 145 mmol/L Final  . Potassium 10/12/2020 3.6  3.5 - 5.1 mmol/L Final  . Chloride 10/12/2020 105  98 - 111 mmol/L Final  . CO2 10/12/2020 26  22 - 32 mmol/L Final  . Glucose, Bld 10/12/2020 132* 70 - 99 mg/dL Final   Glucose reference range applies only to samples taken after fasting for at least 8 hours.  . BUN 10/12/2020 25* 6 - 20 mg/dL Final  . Creatinine, Ser 10/12/2020 1.23* 0.44 - 1.00 mg/dL Final  . Calcium 35/36/1443 8.9  8.9 - 10.3 mg/dL Final  . Total Protein 10/12/2020 7.0  6.5 - 8.1 g/dL Final  . Albumin 15/40/0867 3.1* 3.5 - 5.0 g/dL Final  . AST 61/95/0932 360* 15 - 41 U/L Final  . ALT 10/12/2020 127* 0 - 44 U/L Final  . Alkaline Phosphatase 10/12/2020 189* 38 - 126 U/L Final  . Total Bilirubin 10/12/2020 0.8  0.3 - 1.2 mg/dL Final  . GFR, Estimated 10/12/2020 52* >60 mL/min Final  . Anion gap 10/12/2020 9  5 - 15 Final   Performed at [facility], [address] [place], [place] [address]  . WBC 10/12/2020 4.7  4.0 - 10.5 K/uL Final  . RBC 10/12/2020 3.07* 3.87 - 5.11 MIL/uL Final  . Hemoglobin 10/12/2020 8.3* 12.0 - 15.0 g/dL Final  . HCT 58/08/9832 25.9* 36.0 - 46.0 % Final  . MCV 10/12/2020 84.4  80.0 - 100.0 fL Final  . MCH 10/12/2020 27.0  26.0 - 34.0 pg Final  . MCHC 10/12/2020 32.0  30.0 - 36.0 g/dL Final  . RDW 82/50/5397 19.9* 11.5 - 15.5 % Final  . Platelets 10/12/2020 77* 150 - 400 K/uL Final   Comment: REPEATED TO VERIFY Immature Platelet Fraction may be clinically indicated, consider ordering this additional test [id number] CONSISTENT WITH PREVIOUS RESULT   . nRBC 10/12/2020 0.0  0.0 - 0.2 % Final   Performed at [facility], [address] [place] [place], [place] [address]  . Vitamin B-12 10/12/2020 517  180 - 914 pg/mL Final   Comment: (NOTE) This assay is not validated for testing neonatal or myeloproliferative syndrome specimens for Vitamin B12 levels. Performed at [facility], [address], [place], [place] [address]   . Folate 10/12/2020 12.0  >5.9 ng/mL Final   Performed at [facility], [address] [place] [place], [place] [address]  . Total CK 10/12/2020 395* 38 - 234 U/L Final   Performed at [facility], [address] [place] [place], [place] [address]  . Magnesium 10/13/2020 1.4* 1.7 - 2.4 mg/dL Final   Performed at [facility], [address] [place]., [place], [place] [address]  .  Sodium 10/13/2020 138  135 - 145 mmol/L Final  . Potassium 10/13/2020 3.4* 3.5 - 5.1 mmol/L Final  . Chloride 10/13/2020 106  98 - 111 mmol/L Final  . CO2 10/13/2020 23  22 - 32 mmol/L Final  . Glucose, Bld 10/13/2020 152* 70 - 99 mg/dL Final   Glucose reference range applies only to samples taken after fasting for at least 8 hours.  . BUN 10/13/2020 16  6 - 20 mg/dL Final  . Creatinine, Ser 10/13/2020 0.87  0.44 - 1.00 mg/dL Final  . Calcium 16/09/9603 9.0  8.9 - 10.3 mg/dL Final  . Total Protein 10/13/2020 6.4* 6.5 - 8.1 g/dL Final  . Albumin 54/08/8118 3.0* 3.5 - 5.0 g/dL Final  . AST 14/78/2956 258* 15 - 41 U/L Final  . ALT 10/13/2020 132* 0 - 44 U/L Final  . Alkaline Phosphatase 10/13/2020 174* 38 - 126 U/L Final  . Total Bilirubin 10/13/2020 0.6  0.3 - 1.2 mg/dL Final  . GFR, Estimated 10/13/2020 >60  >60 mL/min Final  . Anion gap 10/13/2020 9  5 - 15 Final   Performed at [facility], [address], [place], [place] [address]  . WBC 10/13/2020 4.2  4.0 - 10.5 K/uL Final  . RBC 10/13/2020 3.00* 3.87 - 5.11 MIL/uL Final  . Hemoglobin 10/13/2020 8.4* 12.0 - 15.0 g/dL Final  . HCT 65/78/4696 25.6* 36.0 - 46.0 % Final  . MCV 10/13/2020 85.3  80.0 - 100.0 fL Final  . MCH 10/13/2020 28.0  26.0 - 34.0 pg Final  . MCHC 10/13/2020 32.8  30.0 - 36.0 g/dL Final  . RDW 29/52/8413 20.4* 11.5 - 15.5 % Final  . Platelets 10/13/2020 96* 150 - 400 K/uL Final   Comment: Immature Platelet Fraction may be clinically indicated, consider ordering  this additional test [id number]   . nRBC 10/13/2020 0.0  0.0 - 0.2 % Final   Performed at [facility], [address] [place] [place], [place] [address]  . Total CK 10/13/2020 407* 38 - 234 U/L Final   Performed at [facility], [address] [place] [place], [place] [address]  . TSH 10/13/2020 3.517  0.350 - 4.500 uIU/mL Final   Comment: Performed by a 3rd Generation assay with a functional sensitivity of <=0.01 uIU/mL. Performed at [facility], [address] [place], [place] [address]   Admission on 10/09/2020, Discharged on 10/10/2020  Component Date Value Ref Range Status  . WBC 10/09/2020 4.9  4.0 - 10.5 K/uL Final  . RBC 10/09/2020 3.98  3.87 - 5.11 MIL/uL Final  . Hemoglobin 10/09/2020 10.7* 12.0 - 15.0 g/dL Final  . HCT 56/38/7564 32.5* 36.0 - 46.0 % Final  . MCV 10/09/2020 81.7  80.0 - 100.0 fL Final  . MCH 10/09/2020 26.9  26.0 - 34.0 pg Final  . MCHC 10/09/2020 32.9  30.0 - 36.0 g/dL Final  . RDW 33/29/5188 18.5* 11.5 - 15.5 % Final  . Platelets 10/09/2020 70* 150 - 400 K/uL Final   Comment: Immature Platelet Fraction may be clinically indicated, consider ordering this additional test [id number]   . nRBC 10/09/2020 0.0  0.0 - 0.2 % Final   Performed at [facility], [address] [place] [place], [place] [address]  . Sodium 10/09/2020 133* 135 - 145 mmol/L Final  . Potassium 10/09/2020 3.9  3.5 - 5.1 mmol/L Final   HEMOLYSIS AT THIS LEVEL MAY AFFECT RESULT  . Chloride 10/09/2020 97* 98 - 111 mmol/L Final  . CO2 10/09/2020 22  22 - 32 mmol/L Final  . Glucose, Bld 10/09/2020 224* 70 - 99 mg/dL Final   Glucose reference range applies only to samples taken after fasting for at least 8 hours.  . BUN 10/09/2020 15  6 - 20 mg/dL Final  . Creatinine, Ser 10/09/2020 1.93* 0.44 - 1.00 mg/dL Final  . Calcium 16/10/960410/15/2021 8.4* 8.9 - 10.3 mg/dL Final  . Total Protein 10/09/2020 8.0  6.5 - 8.1 g/dL Final  . Albumin 54/09/811910/15/2021 3.6  3.5 - 5.0 g/dL Final  . AST  14/78/295610/15/2021 299* 15 - 41 U/L Final   HEMOLYSIS AT THIS LEVEL MAY AFFECT RESULT  . ALT 10/09/2020 73* 0 - 44 U/L Final  . Alkaline Phosphatase 10/09/2020 216* 38 - 126 U/L Final  . Total Bilirubin 10/09/2020 1.7* 0.3 - 1.2 mg/dL Final   HEMOLYSIS AT THIS LEVEL MAY AFFECT RESULT  . GFR, Estimated 10/09/2020 30* >60 mL/min Final  . Anion gap 10/09/2020 14  5 - 15 Final   Performed at [facility], [address] [place], [place] [address]  . Magnesium 10/09/2020 1.1* 1.7 - 2.4 mg/dL Final   Performed at [facility], [address] [place], [place] [address]  . Phosphorus 10/09/2020 2.2* 2.5 - 4.6 mg/dL Final   Performed at [facility], [address] [place], [place] [address]  . Sodium 10/10/2020 136  135 - 145 mmol/L Final  . Potassium 10/10/2020 3.1* 3.5 - 5.1 mmol/L Final  . Chloride 10/10/2020 96* 98 - 111 mmol/L Final  . CO2 10/10/2020 26  22 - 32 mmol/L Final  . Glucose, Bld 10/10/2020 131* 70 - 99 mg/dL Final   Glucose reference range applies only to samples taken after fasting for at least 8 hours.  . BUN 10/10/2020 36* 6 - 20 mg/dL Final  . Creatinine, Ser 10/10/2020 2.75* 0.44 - 1.00 mg/dL Final  . Calcium 52/84/132410/16/2021 9.9  8.9 - 10.3 mg/dL Final  . Total Protein 10/10/2020 9.2* 6.5 - 8.1 g/dL Final  . Albumin 40/10/272510/16/2021 4.6  3.5 - 5.0 g/dL Final  . AST 36/64/403410/16/2021 291* 15 - 41 U/L Final  . ALT 10/10/2020 87* 0 - 44 U/L Final  . Alkaline Phosphatase 10/10/2020 231* 38 - 126 U/L Final  . Total Bilirubin 10/10/2020 1.7* 0.3 - 1.2 mg/dL Final  . GFR, Estimated 10/10/2020 20* >60 mL/min Final  . Anion gap 10/10/2020 14  5 - 15 Final   Performed at [facility], [address] [place], [place] [address]  . Hgb A1c MFr Bld 10/10/2020 6.0* 4.8 - 5.6 % Final   Comment: (NOTE)         Prediabetes: 5.7 - 6.4         Diabetes: >6.4         Glycemic control for adults with diabetes: <7.0   . Mean Plasma Glucose 10/10/2020 126  mg/dL Final    Comment: (NOTE) Performed At: [facility] [place] [address] [place], [place] [id number] Jolene SchimkeNagendra Sanjai MD PI:[id number]:[phone]   Admission on 10/06/2020, Discharged on 10/09/2020  Component Date Value Ref Range Status  . Sodium 10/06/2020 143  135 - 145 mmol/L Final  . Potassium 10/06/2020 2.8* 3.5 - 5.1 mmol/L Final  . Chloride 10/06/2020 100  98 - 111 mmol/L Final  . CO2 10/06/2020 29  22 - 32 mmol/L Final  . Glucose, Bld 10/06/2020 135* 70 - 99 mg/dL Final   Glucose reference range applies only to samples taken after fasting for at least  8 hours.  . BUN 10/06/2020 6  6 - 20 mg/dL Final  . Creatinine, Ser 10/06/2020 0.85  0.44 - 1.00 mg/dL Final  . Calcium 16/09/9603 8.2* 8.9 - 10.3 mg/dL Final  . Total Protein 10/06/2020 7.9  6.5 - 8.1 g/dL Final  . Albumin 54/08/8118 3.9  3.5 - 5.0 g/dL Final  . AST 14/78/2956 250* 15 - 41 U/L Final  . ALT 10/06/2020 66* 0 - 44 U/L Final  . Alkaline Phosphatase 10/06/2020 198* 38 - 126 U/L Final  . Total Bilirubin 10/06/2020 1.1  0.3 - 1.2 mg/dL Final  . GFR, Estimated 10/06/2020 >60  >60 mL/min Final  . Anion gap 10/06/2020 14  5 - 15 Final   Performed at [facility], [address], [place], [place] [address]  . Alcohol, Ethyl (B) 10/06/2020 412* <10 mg/dL Final   Comment: CRITICAL RESULT CALLED TO, READ BACK BY AND VERIFIED WITH: RN [facility] AT 2323 10/06/20 CRUICKSHANK A (NOTE) Lowest detectable limit for serum alcohol is 10 mg/dL.  For medical purposes only. Performed at [facility], [address], [place], [place] [address]   . Salicylate Lvl 10/06/2020 <7.0* 7.0 - 30.0 mg/dL Final   Performed at [facility], [address], [place], [place] [address]  . Acetaminophen (Tylenol), Serum 10/06/2020 <10* 10 - 30 ug/mL Final   Comment: (NOTE) Therapeutic concentrations vary significantly. A range of 10-30 ug/mL  may be an effective concentration for many patients. However, some   are best treated at concentrations outside of this range. Acetaminophen concentrations >150 ug/mL at 4 hours after ingestion  and >50 ug/mL at 12 hours after ingestion are often associated with  toxic reactions.  Performed at [facility], [address] [place], [place] [address]   . WBC 10/06/2020 3.3* 4.0 - 10.5 K/uL Final  . RBC 10/06/2020 4.13  3.87 - 5.11 MIL/uL Final  . Hemoglobin 10/06/2020 11.1* 12.0 - 15.0 g/dL Final  . HCT 32/44/0102 34.1* 36.0 - 46.0 % Final  . MCV 10/06/2020 82.6  80.0 - 100.0 fL Final  . MCH 10/06/2020 26.9  26.0 - 34.0 pg Final  . MCHC 10/06/2020 32.6  30.0 - 36.0 g/dL Final  . RDW 72/53/6644 19.8* 11.5 - 15.5 % Final  . Platelets 10/06/2020 106* 150 - 400 K/uL Final   Comment: REPEATED TO VERIFY PLATELET COUNT CONFIRMED BY SMEAR SPECIMEN CHECKED FOR CLOTS Immature Platelet Fraction may be clinically indicated, consider ordering this additional test [id number]   . nRBC 10/06/2020 0.0  0.0 - 0.2 % Final   Performed at [facility], [address], [place], [place] [address]  . Opiates 10/07/2020 NONE DETECTED  NONE DETECTED Final  . Cocaine 10/07/2020 NONE DETECTED  NONE DETECTED Final  . Benzodiazepines 10/07/2020 NONE DETECTED  NONE DETECTED Final  . Amphetamines 10/07/2020 NONE DETECTED  NONE DETECTED Final  . Tetrahydrocannabinol 10/07/2020 NONE DETECTED  NONE DETECTED Final  . Barbiturates 10/07/2020 NONE DETECTED  NONE DETECTED Final   Comment: (NOTE) DRUG SCREEN FOR MEDICAL PURPOSES ONLY.  IF CONFIRMATION IS NEEDED FOR ANY PURPOSE, NOTIFY LAB WITHIN 5 DAYS.  LOWEST DETECTABLE LIMITS FOR URINE DRUG SCREEN Drug Class                     Cutoff (ng/mL) Amphetamine and metabolites    1000 Barbiturate and metabolites    200 Benzodiazepine  200 Tricyclics and metabolites     300 Opiates and metabolites        300 Cocaine and metabolites        300 THC                             50 Performed at [facility], [address] [place], [place] [address]   . I-stat hCG, quantitative 10/06/2020 <5.0  <5 mIU/mL Final  . Comment 3 10/06/2020          Final   Comment:   GEST. AGE      CONC.  (mIU/mL)   <=1 WEEK        5 - 50     2 WEEKS       50 - 500     3 WEEKS       100 - 10,000     4 WEEKS     1,000 - 30,000        FEMALE AND NON-PREGNANT FEMALE:     LESS THAN 5 mIU/mL   . SARS Coronavirus 2 by RT PCR 10/07/2020 NEGATIVE  NEGATIVE Final   Comment: (NOTE) SARS-CoV-2 target nucleic acids are NOT DETECTED.  The SARS-CoV-2 RNA is generally detectable in upper respiratoy specimens during the acute phase of infection. The lowest concentration of SARS-CoV-2 viral copies this assay can detect is 131 copies/mL. A negative result does not preclude SARS-Cov-2 infection and should not be used as the sole basis for treatment or other patient management decisions. A negative result may occur with  improper specimen collection/handling, submission of specimen other than nasopharyngeal swab, presence of viral mutation(s) within the areas targeted by this assay, and inadequate number of viral copies (<131 copies/mL). A negative result must be combined with clinical observations, patient history, and epidemiological information. The expected result is Negative.  Fact Sheet for Patients:  https://www.moore.com/  Fact Sheet for Healthcare Providers:  https://www.young.biz/  This test is no                          t yet approved or cleared by the [place] FDA and  has been authorized for detection and/or diagnosis of SARS-CoV-2 by FDA under an Emergency Use Authorization (EUA). This EUA will remain  in effect (meaning this test can be used) for the duration of the COVID-19 declaration under Section 564(b)(1) of the Act, 21 U.S.C. section 360bbb-3(b)(1), unless the authorization is terminated or revoked  sooner.    . Influenza A by PCR 10/07/2020 NEGATIVE  NEGATIVE Final  . Influenza B by PCR 10/07/2020 NEGATIVE  NEGATIVE Final   Comment: (NOTE) The Xpert Xpress SARS-CoV-2/FLU/RSV assay is intended as an aid in  the diagnosis of influenza from Nasopharyngeal swab specimens and  should not be used as a sole basis for treatment. Nasal washings and  aspirates are unacceptable for Xpert Xpress SARS-CoV-2/FLU/RSV  testing.  Fact Sheet for Patients: https://www.moore.com/  Fact Sheet for Healthcare Providers: https://www.young.biz/  This test is not yet approved or cleared by the [place] FDA and  has been authorized for detection and/or diagnosis of SARS-CoV-2 by  FDA under an Emergency Use Authorization (EUA). This EUA will remain  in effect (meaning this test can be used) for the duration of the  Covid-19 declaration under Section 564(b)(1) of the Act, 21  U.S.C. section 360bbb-3(b)(1), unless the authorization is  terminated or revoked.  Performed at [facility], [address] [place], [place] [address]     Allergies: Lisinopril  PTA Medications: (Not in a hospital admission)   Medical Decision Making  Admission orders placed.   Give clonidine 0.1 mg for HTN (patient refused). Patient states that she took her blood pressure medications earlier today.   Continue losartan 50 mg daily for HTN Continue carvedilol 25 mg BID  Restart thiamine 100 mg daily for nutritional supplementation Restart folic acid 1 mg daily for nutritional supplementation Start hydroxyzine 25 mg TID prn for anxiety Start trazodone 50 mg QHS prn for sleep  Initiate CIWA protocol -lorazepam 1 mg every 6 hours prn for CIWA >10 -ondansetron 4 mg ODT every 6 hours prn nausea/vomiting -loperamide 2-4 mg capsule prn diarrhea or loose stools   Clinical Course as of 03/11/21 0637  Wed Mar 10, 2021  2240 Alcohol, Ethyl (B)(!): 216 [JB]   2333 POCT Urine Drug Screen - (ICup) UDS negative [JB]  Thu Mar 11, 2021  0202 Results for Meeyah, Ovitt NABIHA PLANCK (MRN [id number]) as of 03/11/2021 02:01  Creatinine and BUN slightly elevated. Na+, K+, Cl-, Ca+ and Anion Gap are WNLs.   AST slightly elevated at 64, much lower than results on 10/13/20. ALT 44  03/10/2021 22:41 Sodium: 139 Potassium: 3.7 Chloride: 104 CO2: 24 Glucose: 115 (H) BUN: 23 (H) Creatinine: 1.16 (H) Calcium: 9.3 Anion gap: 11 Alkaline Phosphatase: 93 Albumin: 4.0 AST: 64 (H) ALT: 44 Total Protein: 8.6 (H) Total Bilirubin: 0.9 GFR, Estimated: 58 (L)  [JB]  0619 Hgb/Hct WNLs-improved from previous results, MCV, MCH slightly decreased, RDW slightly elevated  03/10/2021 22:41 WBC: 6.8 RBC: 5.55 (H) Hemoglobin: 13.5 HCT: 43.0 MCV: 77.5 (L) MCH: 24.3 (L) MCHC: 31.4 RDW: 16.6 (H) Platelets: 311 nRBC: 0.0  [JB]    Clinical Course User Index [JB] Jackelyn Poling, NP    Recommendations  Based on my evaluation the patient does not appear to have an emergency medical condition.   Patient denies suicidal ideations. Unable to reach IVC petitioner. Patient will be placed in the continuous assessment area at [facility] for treatment and stabilization. She will be reevaluated on 03/11/2021. The treatment team will determine disposition at that time.     Jackelyn Poling, NP 03/11/21  6:37 AM

## 2021-03-10 NOTE — ED Notes (Signed)
Pt wanted another blanket and it was given. Pt resting@this  time. Will continue to monitor for safety

## 2021-03-10 NOTE — ED Notes (Signed)
Locker 5.  

## 2021-03-11 ENCOUNTER — Encounter (HOSPITAL_COMMUNITY): Payer: Self-pay | Admitting: Emergency Medicine

## 2021-03-11 ENCOUNTER — Other Ambulatory Visit: Payer: Self-pay

## 2021-03-11 LAB — CBC WITH DIFFERENTIAL/PLATELET
Abs Immature Granulocytes: 0.01 10*3/uL (ref 0.00–0.07)
Basophils Absolute: 0.1 10*3/uL (ref 0.0–0.1)
Basophils Relative: 1 %
Eosinophils Absolute: 0.1 10*3/uL (ref 0.0–0.5)
Eosinophils Relative: 2 %
HCT: 43 % (ref 36.0–46.0)
Hemoglobin: 13.5 g/dL (ref 12.0–15.0)
Immature Granulocytes: 0 %
Lymphocytes Relative: 48 %
Lymphs Abs: 3.2 10*3/uL (ref 0.7–4.0)
MCH: 24.3 pg — ABNORMAL LOW (ref 26.0–34.0)
MCHC: 31.4 g/dL (ref 30.0–36.0)
MCV: 77.5 fL — ABNORMAL LOW (ref 80.0–100.0)
Monocytes Absolute: 0.4 10*3/uL (ref 0.1–1.0)
Monocytes Relative: 5 %
Neutro Abs: 3 10*3/uL (ref 1.7–7.7)
Neutrophils Relative %: 44 %
Platelets: 311 10*3/uL (ref 150–400)
RBC: 5.55 MIL/uL — ABNORMAL HIGH (ref 3.87–5.11)
RDW: 16.6 % — ABNORMAL HIGH (ref 11.5–15.5)
WBC: 6.8 10*3/uL (ref 4.0–10.5)
nRBC: 0 % (ref 0.0–0.2)

## 2021-03-11 LAB — COMPREHENSIVE METABOLIC PANEL
ALT: 44 U/L (ref 0–44)
AST: 64 U/L — ABNORMAL HIGH (ref 15–41)
Albumin: 4 g/dL (ref 3.5–5.0)
Alkaline Phosphatase: 93 U/L (ref 38–126)
Anion gap: 11 (ref 5–15)
BUN: 23 mg/dL — ABNORMAL HIGH (ref 6–20)
CO2: 24 mmol/L (ref 22–32)
Calcium: 9.3 mg/dL (ref 8.9–10.3)
Chloride: 104 mmol/L (ref 98–111)
Creatinine, Ser: 1.16 mg/dL — ABNORMAL HIGH (ref 0.44–1.00)
GFR, Estimated: 58 mL/min — ABNORMAL LOW (ref >60)
Glucose, Bld: 115 mg/dL — ABNORMAL HIGH (ref 70–99)
Potassium: 3.7 mmol/L (ref 3.5–5.1)
Sodium: 139 mmol/L (ref 135–145)
Total Bilirubin: 0.9 mg/dL (ref 0.3–1.2)
Total Protein: 8.6 g/dL — ABNORMAL HIGH (ref 6.5–8.1)

## 2021-03-11 LAB — LIPID PANEL
Cholesterol: 144 mg/dL (ref 0–200)
HDL: 50 mg/dL (ref >40)
LDL Cholesterol: 35 mg/dL (ref 0–99)
Total CHOL/HDL Ratio: 2.9 RATIO
Triglycerides: 295 mg/dL — ABNORMAL HIGH (ref <150)
VLDL: 59 mg/dL — ABNORMAL HIGH (ref 0–40)

## 2021-03-11 LAB — RESP PANEL BY RT-PCR (FLU A&B, COVID) ARPGX2
Influenza A by PCR: NEGATIVE
Influenza B by PCR: NEGATIVE
SARS Coronavirus 2 by RT PCR: NEGATIVE

## 2021-03-11 LAB — POCT PREGNANCY, URINE: Preg Test, Ur: NEGATIVE

## 2021-03-11 LAB — ETHANOL: Alcohol, Ethyl (B): 216 mg/dL — ABNORMAL HIGH (ref <10)

## 2021-03-11 MED ORDER — FOLIC ACID 1 MG PO TABS
1.0000 mg | ORAL_TABLET | Freq: Every day | ORAL | 0 refills | Status: AC
Start: 2021-03-11 — End: ?

## 2021-03-11 MED ORDER — VITAMIN B-1 100 MG PO TABS
100.0000 mg | ORAL_TABLET | Freq: Every day | ORAL | 0 refills | Status: AC
Start: 2021-03-11 — End: ?

## 2021-03-11 MED ORDER — CLONIDINE HCL 0.1 MG PO TABS
0.1000 mg | ORAL_TABLET | Freq: Once | ORAL | Status: DC
  Filled 2021-03-11: qty 1

## 2021-03-11 MED ORDER — CARVEDILOL 25 MG PO TABS
25.0000 mg | ORAL_TABLET | Freq: Two times a day (BID) | ORAL | 0 refills | Status: AC
Start: 2021-03-11 — End: ?

## 2021-03-11 NOTE — ED Notes (Signed)
Pt refused BP meds on first attempt to admin. VS obtained and Pt realized her BP was elevated 196/106. Pt agreed to take BP meds afterwards. Will recheck BP again. Safety maintained and will continue to monitor.

## 2021-03-11 NOTE — Discharge Instructions (Signed)
Please come to [facility] (this facility) during walk in hours for appointment with psychiatrist for further medication management and for therapy.   Walk in hours are 8-11 AM Monday through Thursday for medication management.It is first come, first -serve; it is best to arrive by 7:00 AM. On Friday from 1 pm to 4 pm for therapy intake only. Please arrive by 12:00 pm as it is  first come, first -serve.   When you arrive please go upstairs for your appointment. If you are unsure of where to go, inform the front desk that you are here for a walk in appointment and they will assist you with directions upstairs.  Address:  [address], in [place], [address] Ph: [phone]   

## 2021-03-11 NOTE — ED Notes (Signed)
PRN Ativan admin d/t BP continues to be elevated. Consulted w/Dr. Bronwen Betters. Safety maintained and will continue to monitor.

## 2021-03-11 NOTE — ED Notes (Signed)
Discharge instructions provided and Pt stated understanding. Personal belongings returned. Pt alert, orient and ambulatory. Safety maintained. Pt escorted to the front lobby where family was waiting.

## 2021-03-11 NOTE — ED Notes (Signed)
Pt sleeping@this time. Breathing even and unlabored. Will continue to monitor for safety 

## 2021-03-11 NOTE — ED Provider Notes (Signed)
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 She states that she is feeling better this morning. She denies SI/HI/AVH. She recalls what occurred yesterday to precipitate IVC. She states that she was drinking some wine and that there was some conflict with her cousin and her friend and that she picked up the pill bottle and acted like she was going to take it "to make her mad". She recalls making suicidal comments yesterday as well but states this was done "to make her mad". She denies SI, plan or intent. She states that her husband left last year and while that has been difficult she feels that "my life is getting back together". She is currently employed and enjoys her job. She lives with her daughter. She cites her 3 daughters as reasons she would never want to harm hersself- aged 49, 5417, 3919. She states that her 49 yo daughter is getting ready to graduate high school in May and she is looking forward to this. She states that she is prescribed lexapro 10 mg but doesnot have a psychiatrist. She states that she has a therapist named "Olegario MessierKathy" who she speaks too regularly. Pt states that she has not drank consistently for the last 5 months although admits to consuming wine yesterday; per chart review, she has a history of AUD and withdrawal. Pt states that she feels safe to go home and requests letter for work.    Allison [name] (IVC petitioner) [phone] - called at 11:51 AM; phone rang without answer. Unable to leave message as mailbox was full.  Returned call sometime later.  States that Lakara's husband left her and she has been depressed and then will  get on her drinking binge. Went to Suella's house yesterday and saw she had  been drinking. States that while trying to talk to her, French Anaracy was cursing and carrying on. While talking with her , tracey grabbed her pill bottle and attempted to take several, believes she took 1-2 10 mg lexapro and made comments about wanting to kill herself.No immanent safety concerns but states that she is worried that French Anaracy will not follow up with outpatient services as  She has a history of not doing so.. Will pick up at 3 pm.   Stay Summary: Pt presented on 3/16 on IVC for reportedly attempting to kill herself by overdosing on lexapro. Pt denied SI/HI/AVH on admission but was noticeably intoxicated. Patient was admitted to the observation unit. BAL 216. The following day patient continue to deny SI/HI/AVH. Collateral contacted with no imminent safety concerns and she was discharged to care of family member  Total Time spent with patient: 20 minutes  Past Psychiatric History: see H&P Past Medical History:  Past Medical History:  Diagnosis Date  . Alcohol abuse   . Anemia   . Anxiety   . Depression   . GERD (gastroesophageal reflux disease)   . Hypertension     Past Surgical History:  Procedure Laterality Date  . CESAREAN SECTION  1998   Family History:  Family History  Problem Relation Age of Onset  . Hypertension Mother   . Diabetes Mother   . Hypertension Father   . Diabetes Father   . Colon cancer Sister [id number]   Family  Psychiatric History: see H&P Social History:  Social History   Substance and Sexual Activity  Alcohol Use Yes  . Alcohol/week: 14.0 standard drinks  . Types: 14 Standard drinks or equivalent per week   Comment: pint of liquor     Social History   Substance and Sexual Activity  Drug Use Yes  . Types: Marijuana    Social History   Socioeconomic History  . Marital status: Married    Spouse name: Not on file  . Number of children: Not on file  . Years of education: Not on file  . Highest education level: Not on file  Occupational History  . Not  on file  Tobacco Use  . Smoking status: Never Smoker  . Smokeless tobacco: Never Used  Substance and Sexual Activity  . Alcohol use: Yes    Alcohol/week: 14.0 standard drinks    Types: 14 Standard drinks or equivalent per week    Comment: pint of liquor  . Drug use: Yes    Types: Marijuana  . Sexual activity: Yes    Birth control/protection: Implant  Other Topics Concern  . Not on file  Social History Narrative  . Not on file   Social Determinants of Health   Financial Resource Strain: Not on file  Food Insecurity: Not on file  Transportation Needs: Not on file  Physical Activity: Not on file  Stress: Not on file  Social Connections: Not on file   SDOH:  SDOH Screenings   Alcohol Screen: Low Risk   . Last Alcohol Screening Score (AUDIT): 6  Depression (PHQ2-9): Not on file  Financial Resource Strain: Not on file  Food Insecurity: Not on file  Housing: Not on file  Physical Activity: Not on file  Social Connections: Not on file  Stress: Not on file  Tobacco Use: Low Risk   . Smoking Tobacco Use: Never Smoker  . Smokeless Tobacco Use: Never Used  Transportation Needs: Not on file    Has this patient used any form of tobacco in the last 30 days? (Cigarettes, Smokeless Tobacco, Cigars, and/or Pipes) Prescription not provided because: n/a  Current Medications:  Current Facility-Administered Medications  Medication Dose Route Frequency Provider Last Rate Last Admin  . acetaminophen (TYLENOL) tablet 650 mg  650 mg Oral Q6H PRN Nira Conn A, NP   650 mg at 03/11/21 0932  . alum & mag hydroxide-simeth (MAALOX/MYLANTA) 200-200-20 MG/5ML suspension 30 mL  30 mL Oral Q4H PRN Nira Conn A, NP      . carvedilol (COREG) tablet 25 mg  25 mg Oral BID WC Nira Conn A, NP   25 mg at 03/11/21 0919  . cloNIDine (CATAPRES) tablet 0.1 mg  0.1 mg Oral Once Nira Conn A, NP      . folic acid (FOLVITE) tablet 1 mg  1 mg Oral Daily Nira Conn A, NP   1 mg at 03/11/21 0254  .  hydrOXYzine (ATARAX/VISTARIL) tablet 25 mg  25 mg Oral TID PRN Nira Conn A, NP      . loperamide (IMODIUM) capsule 2-4 mg  2-4 mg Oral PRN Nira Conn A, NP      . LORazepam (ATIVAN) tablet 1 mg  1 mg Oral Q6H PRN Nira Conn A, NP   1 mg at 03/11/21 1048  . losartan (COZAAR) tablet 50 mg  50 mg Oral Daily Nira Conn A, NP   50 mg at 03/11/21 0920  . magnesium hydroxide (MILK OF MAGNESIA) suspension 30 mL  30 mL  Oral Daily PRN Nira Conn A, NP      . multivitamin with minerals tablet 1 tablet  1 tablet Oral Daily Nira Conn A, NP   1 tablet at 03/11/21 0907  . ondansetron (ZOFRAN-ODT) disintegrating tablet 4 mg  4 mg Oral Q6H PRN Nira Conn A, NP      . thiamine tablet 100 mg  100 mg Oral Daily Nira Conn A, NP   100 mg at 03/11/21 0907  . traZODone (DESYREL) tablet 50 mg  50 mg Oral QHS PRN Jackelyn Poling, NP       Current Outpatient Medications  Medication Sig Dispense Refill  . escitalopram (LEXAPRO) 10 MG tablet Take 10 mg by mouth daily.    . carvedilol (COREG) 25 MG tablet Take 1 tablet (25 mg total) by mouth 2 (two) times daily with a meal. 60 tablet 0  . folic acid (FOLVITE) 1 MG tablet Take 1 tablet (1 mg total) by mouth daily. 30 tablet 0  . losartan (COZAAR) 50 MG tablet Take 1 tablet (50 mg total) by mouth daily. (Patient not taking: Reported on 06/07/2020) 30 tablet 0  . thiamine (VITAMIN B-1) 100 MG tablet Take 1 tablet (100 mg total) by mouth daily. 30 tablet 0    PTA Medications: (Not in a hospital admission)   Musculoskeletal  Strength & Muscle Tone: within normal limits Gait & Station: normal Patient leans: N/A  Psychiatric Specialty Exam  Presentation  General Appearance: Appropriate for Environment; Casual  Eye Contact:Good  Speech:Clear and Coherent; Normal Rate  Speech Volume:Normal  Handedness:Right   Mood and Affect  Mood:Euthymic  Affect:Appropriate; Congruent   Thought Process  Thought Processes:Coherent; Linear  Descriptions of  Associations:Intact  Orientation:Full (Time, Place and Person)  Thought Content:WDL  Diagnosis of Schizophrenia or Schizoaffective disorder in past: No    Hallucinations:Hallucinations: None  Ideas of Reference:None  Suicidal Thoughts:Suicidal Thoughts: No  Homicidal Thoughts:Homicidal Thoughts: No   Sensorium  Memory:Immediate Good; Recent Good; Remote Fair  Judgment:Fair  Insight:Fair   Executive Functions  Concentration:Fair  Attention Span:Fair  Recall:Fair  Fund of Knowledge:Fair  Language:Fair   Psychomotor Activity  Psychomotor Activity:Psychomotor Activity: Normal   Assets  Assets:Communication Skills; Desire for Improvement; Financial Resources/Insurance; Housing; Physical Health   Sleep  Sleep:Sleep: Fair   Nutritional Assessment (For OBS and FBC admissions only) Has the patient had a weight loss or gain of 10 pounds or more in the last 3 months?: No Has the patient had a decrease in food intake/or appetite?: No Does the patient have dental problems?: No Does the patient have eating habits or behaviors that may be indicators of an eating disorder including binging or inducing vomiting?: No Has the patient recently lost weight without trying?: No Has the patient been eating poorly because of a decreased appetite?: No Malnutrition Screening Tool Score: 0    Physical Exam  Physical Exam Constitutional:      Appearance: Normal appearance. She is normal weight.  HENT:     Head: Normocephalic and atraumatic.  Pulmonary:     Effort: Pulmonary effort is normal.  Neurological:     Mental Status: She is alert.    Review of Systems  Constitutional: Negative for fever.  HENT: Negative for hearing loss.   Eyes: Negative for pain.  Respiratory: Negative for cough.   Cardiovascular: Negative for chest pain.  Gastrointestinal: Negative for abdominal pain.  Musculoskeletal: Negative for myalgias.  Neurological: Negative for sensory change and  speech change.  Psychiatric/Behavioral: Positive for  depression. Negative for suicidal ideas.   Blood pressure (!) 180/103, pulse 89, temperature 98.6 F (37 C), temperature source Oral, resp. rate 16, SpO2 98 %. There is no height or weight on file to calculate BMI.  Demographic Factors:  NA  Loss Factors: Loss of significant relationship  Historical Factors: Impulsivity  Risk Reduction Factors:   Responsible for children under 52 years of age, Sense of responsibility to family, Employed, Living with another person, especially a relative and Positive social support  Continued Clinical Symptoms:  Previous Psychiatric Diagnoses and Treatments Medical Diagnoses and Treatments/Surgeries  Cognitive Features That Contribute To Risk:  Thought constriction (tunnel vision)    Suicide Risk:  Minimal: No identifiable suicidal ideation.  Patients presenting with no risk factors but with morbid ruminations; may be classified as minimal risk based on the severity of the depressive symptoms  Plan Of Care/Follow-up recommendations:  Activity:  as tolerated Diet:  regular Other:    Please come to [facility] (this facility) during walk in hours for appointment with psychiatrist for further medication management and for therapy.   Walk in hours are 8-11 AM Monday through Thursday for medication management.It is first come, first -serve; it is best to arrive by 7:00 AM. On Friday from 1 pm to 4 pm for therapy intake only. Please arrive by 12:00 pm as it is  first come, first -serve.   When you arrive please go upstairs for your appointment. If you are unsure of where to go, inform the front desk that you are here for a walk in appointment and they will assist you with directions upstairs.  Address:  [address], in [place], [address] Ph: [phone]    Disposition: home with family member  Estella Husk, MD 03/11/2021, 1:52 PM

## 2021-03-11 NOTE — ED Notes (Addendum)
BP and Pulse elevated. I let Laretta Alstrom know.

## 2021-03-11 NOTE — ED Triage Notes (Signed)
IVC papers taken out by 1st cousin, pt presents with suicidal ideations, plan to take pills. Attempt stopped by petitioner.  Smell of ETOH noted. gait slightly unsteady  Pt  Denies HI.  Skin search completed, monitoring for safety.

## 2021-03-29 ENCOUNTER — Telehealth: Payer: Self-pay | Admitting: Internal Medicine

## 2021-03-29 NOTE — Telephone Encounter (Signed)
Good afternoon Dr. Marina Goodell, patient called stating she does not have care partner to come with her to appt scheduled for 03/31/21 so she rescheduled to 06/17/21.

## 2021-03-31 ENCOUNTER — Encounter: Payer: Medicaid Other | Admitting: Internal Medicine

## 2021-06-17 ENCOUNTER — Encounter: Payer: Medicaid Other | Admitting: Internal Medicine

## 2024-10-20 ENCOUNTER — Emergency Department (HOSPITAL_COMMUNITY)
Admission: EM | Admit: 2024-10-20 | Discharge: 2024-10-20 | Disposition: A | Discharge status: Home / Self Care | Attending: Emergency Medicine | Admitting: Emergency Medicine

## 2024-10-20 ENCOUNTER — Encounter (HOSPITAL_COMMUNITY): Payer: Self-pay | Admitting: Emergency Medicine

## 2024-10-20 DIAGNOSIS — I10 Essential (primary) hypertension: Secondary | ICD-10-CM | POA: Insufficient documentation

## 2024-10-20 DIAGNOSIS — M79645 Pain in left finger(s): Secondary | ICD-10-CM | POA: Diagnosis present

## 2024-10-20 DIAGNOSIS — Z79899 Other long term (current) drug therapy: Secondary | ICD-10-CM | POA: Diagnosis not present

## 2024-10-20 MED ORDER — CEPHALEXIN 500 MG PO CAPS: 500.0000 mg | ORAL_CAPSULE | Freq: Four times a day (QID) | ORAL | 0 refills | Status: AC

## 2024-10-20 NOTE — Discharge Instructions (Signed)
 You were evaluated in the emergency room for finger pain.  A prescription for antibiotics was sent into your pharmacy.  Please be sure to complete the full course of antibiotics.  Please call the number listed to follow the primary care doctor.

## 2024-10-20 NOTE — ED Provider Notes (Signed)
 [facility] Provider Note   CSN: [id number] Arrival date & time: 10/20/24  9246     Patient presents with: Finger Injury   Allison [name] is a 53 y.o. female presents with complaints of right ring finger pain.  Pain has been ongoing for the past few days.  Describes swelling.  No injury or trauma.  Reports that she has had this in the past with some drainage however this time she is without any drainage.  No fevers or chills.   HPI  Past Medical History:  Diagnosis Date   Alcohol abuse    Anemia    Anxiety    Depression    GERD (gastroesophageal reflux disease)    Hypertension    Past Surgical History:  Procedure Laterality Date   CESAREAN SECTION  1998       Prior to Admission medications   Medication Sig Start Date End Date Taking? Authorizing Provider  cephALEXin  (KEFLEX ) 500 MG capsule Take 1 capsule (500 mg total) by mouth 4 (four) times daily. 10/20/24  Yes Donnajean Lynwood DEL, PA-C  carvedilol  (COREG ) 25 MG tablet Take 1 tablet (25 mg total) by mouth 2 (two) times daily with a meal. 03/11/21   Hansel Comer RAMAN, MD  escitalopram (LEXAPRO) 10 MG tablet Take 10 mg by mouth daily.    [provider]  folic acid  (FOLVITE ) 1 MG tablet Take 1 tablet (1 mg total) by mouth daily. 03/11/21   Hansel Comer RAMAN, MD  losartan  (COZAAR ) 50 MG tablet Take 1 tablet (50 mg total) by mouth daily. Patient not taking: Reported on 06/07/2020 02/01/20   Jerri Keys, MD  thiamine  (VITAMIN B-1) 100 MG tablet Take 1 tablet (100 mg total) by mouth daily. 03/11/21   Hansel Comer RAMAN, MD    Allergies: Lisinopril     Review of Systems  Musculoskeletal:  Positive for myalgias.    Updated Vital Signs BP (!) 150/93 (BP Location: Right Arm)   Pulse 88   Temp 98 F (36.7 C) (Oral)   Resp 18   SpO2 95%   Physical Exam Vitals and nursing note reviewed.  Constitutional:      General: She is not in acute distress.    Appearance:  She is well-developed.  HENT:     Head: Normocephalic and atraumatic.  Eyes:     Conjunctiva/sclera: Conjunctivae normal.  Cardiovascular:     Pulses: Normal pulses.  Pulmonary:     Effort: Pulmonary effort is normal. No respiratory distress.  Musculoskeletal:     Cervical back: Neck supple.     Comments: Mild lateral swelling with associated tenderness, no significant erythema or warmth, no evidence of ingrown nail  Skin:    General: Skin is warm and dry.     Capillary Refill: Capillary refill takes less than 2 seconds.  Neurological:     Mental Status: She is alert.  Psychiatric:        Mood and Affect: Mood normal.     (all labs ordered are listed, but only abnormal results are displayed) Labs Reviewed - No data to display  EKG: None  Radiology: No results found.   Procedures   Medications Ordered in the ED - No data to display  Clinical Course as of 10/20/24 0856  [facility] Oct 20, 2024  9152 Patient evaluated for complaints of atraumatic right ring finger pain for the past few days.  She is hemodynamically stable.  On exam she has some mild swelling  and tenderness to the lateral aspect of the finger.  There is no obvious purulence, induration or fluctuance.  No significant erythema or warmth.  No evidence of ingrown nail.  Overall most suspicious for early development of paronychia.  Will send in a course of Keflex  and provide PCP follow-up.  Strict return precautions provided.  Patient is understanding agreement plan. [JT]    Clinical Course User Index [JT] Donnajean Lynwood DEL, PA-C                                 Medical Decision Making  This patient presents to the ED with chief complaint(s) of finger pain.  The complaint involves an extensive differential diagnosis and also carries with it a high risk of complications and morbidity.   Pertinent past medical history as listed in HPI  The differential diagnosis includes  Exam and history not consistent with septic  joint or gout.  No induration or fluctuance to suggest abscess.  No injury or trauma to be concerned about fracture or dislocation  Additional history obtained: Records reviewed Care Everywhere/External Records  Disposition:   Patient will be discharged home. The patient has been appropriately medically screened and/or stabilized in the ED. I have low suspicion for any other emergent medical condition which would require further screening, evaluation or treatment in the ED or require inpatient management. At time of discharge the patient is hemodynamically stable and in no acute distress. I have discussed work-up results and diagnosis with patient and answered all questions. Patient is agreeable with discharge plan. We discussed strict return precautions for returning to the emergency department and they verbalized understanding.     Social Determinants of Health:   Patient's impaired access to primary care  increases the complexity of managing their presentation  This note was dictated with voice recognition software.  Despite best efforts at proofreading, errors may have occurred which can change the documentation meaning.       Final diagnoses:  Finger pain, left    ED Discharge Orders          Ordered    cephALEXin  (KEFLEX ) 500 MG capsule  4 times daily        10/20/24 0856               Donnajean Lynwood DEL, PA-C 10/20/24 9143    Ula Prentice SAUNDERS, MD 10/20/24 1416

## 2024-10-20 NOTE — ED Triage Notes (Signed)
 Pt here from home with c/o swollen ring finger on her right hand
